# Patient Record
Sex: Female | Born: 1935
Health system: Southern US, Community
[De-identification: ages and names within clinical notes are randomized; demographics above are authoritative.]

## PROBLEM LIST (undated history)

## (undated) DIAGNOSIS — F32A Depression, unspecified: Secondary | ICD-10-CM

## (undated) DIAGNOSIS — F419 Anxiety disorder, unspecified: Secondary | ICD-10-CM

## (undated) DIAGNOSIS — M810 Age-related osteoporosis without current pathological fracture: Secondary | ICD-10-CM

## (undated) DIAGNOSIS — D72819 Decreased white blood cell count, unspecified: Secondary | ICD-10-CM

## (undated) DIAGNOSIS — E039 Hypothyroidism, unspecified: Secondary | ICD-10-CM

## (undated) DIAGNOSIS — M47812 Spondylosis without myelopathy or radiculopathy, cervical region: Secondary | ICD-10-CM

## (undated) DIAGNOSIS — F329 Major depressive disorder, single episode, unspecified: Secondary | ICD-10-CM

## (undated) DIAGNOSIS — M1990 Unspecified osteoarthritis, unspecified site: Secondary | ICD-10-CM

## (undated) DIAGNOSIS — I10 Essential (primary) hypertension: Secondary | ICD-10-CM

## (undated) HISTORY — PX: JOINT REPLACEMENT: SHX530

## (undated) HISTORY — DX: Decreased white blood cell count, unspecified: D72.819

## (undated) HISTORY — DX: Spondylosis without myelopathy or radiculopathy, cervical region: M47.812

## (undated) HISTORY — PX: FRACTURE SURGERY: SHX138

## (undated) HISTORY — DX: Age-related osteoporosis without current pathological fracture: M81.0

---

## 1997-12-27 ENCOUNTER — Other Ambulatory Visit: Admission: RE | Admit: 1997-12-27 | Discharge: 1997-12-27 | Payer: Self-pay | Admitting: *Deleted

## 1999-07-31 ENCOUNTER — Other Ambulatory Visit: Admission: RE | Admit: 1999-07-31 | Discharge: 1999-07-31 | Payer: Self-pay | Admitting: *Deleted

## 2000-11-25 ENCOUNTER — Encounter: Payer: Self-pay | Admitting: Orthopaedic Surgery

## 2000-11-25 ENCOUNTER — Ambulatory Visit (HOSPITAL_COMMUNITY): Admission: RE | Admit: 2000-11-25 | Discharge: 2000-11-25 | Payer: Self-pay | Admitting: Orthopaedic Surgery

## 2000-12-09 ENCOUNTER — Ambulatory Visit (HOSPITAL_COMMUNITY): Admission: RE | Admit: 2000-12-09 | Discharge: 2000-12-09 | Payer: Self-pay | Admitting: Orthopaedic Surgery

## 2000-12-16 ENCOUNTER — Ambulatory Visit (HOSPITAL_COMMUNITY): Admission: RE | Admit: 2000-12-16 | Discharge: 2000-12-16 | Payer: Self-pay | Admitting: Orthopaedic Surgery

## 2000-12-16 ENCOUNTER — Encounter: Payer: Self-pay | Admitting: Orthopaedic Surgery

## 2001-06-10 ENCOUNTER — Encounter: Payer: Self-pay | Admitting: Orthopaedic Surgery

## 2001-06-10 ENCOUNTER — Encounter: Admission: RE | Admit: 2001-06-10 | Discharge: 2001-06-10 | Payer: Self-pay | Admitting: Orthopaedic Surgery

## 2001-07-04 ENCOUNTER — Encounter: Admission: RE | Admit: 2001-07-04 | Discharge: 2001-07-04 | Payer: Self-pay | Admitting: Orthopaedic Surgery

## 2001-07-04 ENCOUNTER — Encounter: Payer: Self-pay | Admitting: Orthopaedic Surgery

## 2001-07-07 ENCOUNTER — Other Ambulatory Visit: Admission: RE | Admit: 2001-07-07 | Discharge: 2001-07-07 | Payer: Self-pay | Admitting: *Deleted

## 2002-11-03 ENCOUNTER — Other Ambulatory Visit: Admission: RE | Admit: 2002-11-03 | Discharge: 2002-11-03 | Payer: Self-pay | Admitting: *Deleted

## 2003-03-09 ENCOUNTER — Encounter (INDEPENDENT_AMBULATORY_CARE_PROVIDER_SITE_OTHER): Payer: Self-pay | Admitting: *Deleted

## 2003-03-09 ENCOUNTER — Ambulatory Visit (HOSPITAL_COMMUNITY): Admission: RE | Admit: 2003-03-09 | Discharge: 2003-03-09 | Payer: Self-pay | Admitting: Gastroenterology

## 2003-04-06 ENCOUNTER — Ambulatory Visit (HOSPITAL_COMMUNITY): Admission: RE | Admit: 2003-04-06 | Discharge: 2003-04-06 | Payer: Self-pay | Admitting: Neurosurgery

## 2003-10-18 ENCOUNTER — Ambulatory Visit (HOSPITAL_COMMUNITY): Admission: RE | Admit: 2003-10-18 | Discharge: 2003-10-18 | Payer: Self-pay | Admitting: Neurosurgery

## 2004-11-03 ENCOUNTER — Other Ambulatory Visit: Admission: RE | Admit: 2004-11-03 | Discharge: 2004-11-03 | Payer: Self-pay | Admitting: *Deleted

## 2006-12-29 ENCOUNTER — Other Ambulatory Visit: Admission: RE | Admit: 2006-12-29 | Discharge: 2006-12-29 | Payer: Self-pay | Admitting: *Deleted

## 2008-12-17 ENCOUNTER — Encounter (INDEPENDENT_AMBULATORY_CARE_PROVIDER_SITE_OTHER): Payer: Self-pay | Admitting: *Deleted

## 2008-12-17 ENCOUNTER — Ambulatory Visit (HOSPITAL_COMMUNITY): Admission: RE | Admit: 2008-12-17 | Discharge: 2008-12-17 | Payer: Self-pay | Admitting: *Deleted

## 2010-02-02 HISTORY — PX: HIP FRACTURE SURGERY: SHX118

## 2010-02-19 ENCOUNTER — Encounter: Admission: RE | Admit: 2010-02-19 | Discharge: 2010-02-19 | Payer: Self-pay | Admitting: Family Medicine

## 2010-02-24 ENCOUNTER — Inpatient Hospital Stay (HOSPITAL_COMMUNITY): Admission: EM | Admit: 2010-02-24 | Discharge: 2010-03-03 | Payer: Self-pay | Admitting: Emergency Medicine

## 2010-10-05 HISTORY — PX: EYE SURGERY: SHX253

## 2010-10-05 HISTORY — PX: APPENDECTOMY: SHX54

## 2010-11-20 ENCOUNTER — Other Ambulatory Visit: Payer: Self-pay | Admitting: General Surgery

## 2010-11-20 ENCOUNTER — Emergency Department (HOSPITAL_COMMUNITY): Payer: Medicare Other

## 2010-11-20 ENCOUNTER — Inpatient Hospital Stay (HOSPITAL_COMMUNITY)
Admission: EM | Admit: 2010-11-20 | Discharge: 2010-11-25 | DRG: 339 | Disposition: A | Discharge status: Home / Self Care | Payer: Medicare Other | Attending: Surgery | Admitting: Surgery

## 2010-11-20 DIAGNOSIS — K929 Disease of digestive system, unspecified: Secondary | ICD-10-CM | POA: Diagnosis not present

## 2010-11-20 DIAGNOSIS — K35209 Acute appendicitis with generalized peritonitis, without abscess, unspecified as to perforation: Principal | ICD-10-CM | POA: Diagnosis present

## 2010-11-20 DIAGNOSIS — F1011 Alcohol abuse, in remission: Secondary | ICD-10-CM | POA: Diagnosis present

## 2010-11-20 DIAGNOSIS — I10 Essential (primary) hypertension: Secondary | ICD-10-CM | POA: Diagnosis present

## 2010-11-20 DIAGNOSIS — F329 Major depressive disorder, single episode, unspecified: Secondary | ICD-10-CM | POA: Diagnosis present

## 2010-11-20 DIAGNOSIS — K560 Paralytic ileus: Secondary | ICD-10-CM | POA: Diagnosis not present

## 2010-11-20 DIAGNOSIS — E039 Hypothyroidism, unspecified: Secondary | ICD-10-CM | POA: Diagnosis present

## 2010-11-20 DIAGNOSIS — F3289 Other specified depressive episodes: Secondary | ICD-10-CM | POA: Diagnosis present

## 2010-11-20 DIAGNOSIS — Y836 Removal of other organ (partial) (total) as the cause of abnormal reaction of the patient, or of later complication, without mention of misadventure at the time of the procedure: Secondary | ICD-10-CM | POA: Diagnosis not present

## 2010-11-20 DIAGNOSIS — K352 Acute appendicitis with generalized peritonitis, without abscess: Principal | ICD-10-CM | POA: Diagnosis present

## 2010-11-20 LAB — URINALYSIS, ROUTINE W REFLEX MICROSCOPIC
Hgb urine dipstick: NEGATIVE
Leukocytes, UA: NEGATIVE
Nitrite: NEGATIVE
Protein, ur: 30 mg/dL — AB

## 2010-11-20 LAB — CBC
HCT: 40.6 % (ref 36.0–46.0)
MCV: 89 fL (ref 78.0–100.0)
RBC: 4.56 MIL/uL (ref 3.87–5.11)

## 2010-11-20 LAB — URINE MICROSCOPIC-ADD ON

## 2010-11-20 LAB — POCT I-STAT, CHEM 8
BUN: 16 mg/dL (ref 6–23)
Chloride: 104 mEq/L (ref 96–112)
Creatinine, Ser: 0.8 mg/dL (ref 0.4–1.2)
Glucose, Bld: 142 mg/dL — ABNORMAL HIGH (ref 70–99)
Potassium: 3.6 mEq/L (ref 3.5–5.1)
Sodium: 135 mEq/L (ref 135–145)

## 2010-11-20 LAB — ETHANOL: Alcohol, Ethyl (B): <5 mg/dL (ref 0–10)

## 2010-11-20 LAB — DIFFERENTIAL
Basophils Absolute: 0 10*3/uL (ref 0.0–0.1)
Basophils Relative: 0 % (ref 0–1)
Eosinophils Absolute: 0 10*3/uL (ref 0.0–0.7)
Lymphocytes Relative: 10 % — ABNORMAL LOW (ref 12–46)
Lymphs Abs: 1 10*3/uL (ref 0.7–4.0)
Monocytes Absolute: 0.8 10*3/uL (ref 0.1–1.0)
Monocytes Relative: 8 % (ref 3–12)
Neutrophils Relative %: 83 % — ABNORMAL HIGH (ref 43–77)

## 2010-11-20 LAB — LACTIC ACID, PLASMA: Lactic Acid, Venous: 1.9 mmol/L (ref 0.5–2.2)

## 2010-11-21 LAB — CBC
Hemoglobin: 11.2 g/dL — ABNORMAL LOW (ref 12.0–15.0)
MCH: 29.8 pg (ref 26.0–34.0)
RBC: 3.76 MIL/uL — ABNORMAL LOW (ref 3.87–5.11)
WBC: 3.5 10*3/uL — ABNORMAL LOW (ref 4.0–10.5)

## 2010-11-21 LAB — BASIC METABOLIC PANEL
BUN: 11 mg/dL (ref 6–23)
CO2: 25 mEq/L (ref 19–32)
Calcium: 8.7 mg/dL (ref 8.4–10.5)
Creatinine, Ser: 0.77 mg/dL (ref 0.4–1.2)
GFR calc Af Amer: >60 mL/min (ref >60)
Glucose, Bld: 136 mg/dL — ABNORMAL HIGH (ref 70–99)
Potassium: 4.2 mEq/L (ref 3.5–5.1)

## 2010-11-24 NOTE — Op Note (Signed)
NAMEDELANO, SCARDINO NO.:  [id number]  MEDICAL RECORD NO.:  [id number]           PATIENT TYPE:  E  LOCATION:  WLED                         FACILITY:  [facility]  PHYSICIAN:  Mary Sella. Andrey Campanile, MD     DATE OF BIRTH:  1933-12-22  DATE OF PROCEDURE:  11/20/2010 DATE OF DISCHARGE:                              OPERATIVE REPORT   PREOPERATIVE DIAGNOSIS:  Acute appendicitis.  POSTOPERATIVE DIAGNOSIS:  Perforated appendicitis.  PROCEDURE:  Laparoscopic appendectomy.  SURGEON:  Mary Sella. Andrey Campanile, MD  ASSISTANT SURGEON:  None.  ANESTHESIA:  General plus 24 cc of 0.25% Marcaine with epinephrine.  SPECIMEN:  Appendix.  ESTIMATED BLOOD LOSS:  Minimal.  FINDINGS:  The patient had a perforated appendix at the midportion of the appendix.  There was feculent material in the right pelvis.  The appendiceal base appeared inflamed, however, it was viable and therefore, I stapled across the base of the appendix incorporating a portion of the cecum.  I irrigated her abdomen with 5 L of saline.  INDICATIONS FOR PROCEDURE:  The patient is a 77 year old female who started feeling ill day before yesterday.  She started with periumbilical pain that migrated to her right lower quadrant.  Her pain worsened to where she could not tolerate it anymore, she came to the emergency room.  Workup and the CT scan were performed which demonstrated acute appendicitis.  We discussed the risks and benefits of the surgery including bleeding, infection, injury to surrounding structures, need to convert to an open procedure, DVT occurrence, hernia formation, wound infection, abscess formation, staple line complications such as bleeding or leak.  I also talked about the possibility of a prolonged ileus as a result of the intra-abdominal infection.  She elected to proceed to surgery.  DESCRIPTION OF PROCEDURE:  The patient was brought to the operating room, placed supine on the operating room table.   General endotracheal anesthesia was established.  Sequential compression devices were placed. A Foley catheter was placed under sterile condition.  A surgical time- out was performed.  Her abdomen was prepped and draped in the usual standard surgical fashion.  She received 1 g of Invanz in the emergency room.  After abdomen had been prepped and draped in the usual standard surgical fashion, I infiltrated local at the base of her umbilicus. Next, a 1-inch infraumbilical incision was made with a #11 blade.  The fascia was grasped and lifted anteriorly with Kochers.  The fascia was incised with a #11 blade, and the abdominal cavity was entered.  A purse- string suture consisting of 0 Vicryl on a UR-6 needle was placed around the fascial edges and a Hasson trocar was placed under direct visualization of the abdominal cavity.  Pneumoperitoneum was smoothly established up to a patient pressure of 15 mmHg.  The laparoscope was advanced, and the abdominal cavity was surveilled.  There was purulent fluid in the right pericolic gutter. It appeared that the appendix was perforated.  I then placed her in Trendelenburg position and rotated her to the left.  Two 5-mm trocars were then placed, one in the suprapubic region and one in the left  lower quadrant, all under direct visualization after local had been infiltrated.  I then gently reflected her small bowel up out of the pelvis to expose the terminal ileum and the cecum.  Her distal small bowel was a little bit dilated and appeared a little bit hyperemic.  As I exposed the right pericolic gutter, I visualized the tip of the appendix taking up toward the anterior abdominal wall.  I visualized what appeared to be a hole in the midbody of the appendix.  When I grasped the tip of the appendix, there was spontaneous drainage of what appeared to be feculent material out through this perforation in the midbody.  The terminal ileum was reflected up toward  the cecum was again confirmed and the appendix was again grasped and lifted up toward the anterior abdominal wall.  I took down the mesoappendix in a serial fashion using the Harmonic scalpel. There was some bleeding from the mesoappendix stump.  I used the Harmonic and fired it again to obtain hemostasis.  The appendiceal base was well exposed.  The base appeared viable.  I then obtained an articulating linear cutter/stapler was 45-mm white load.  I placed it across the base of the appendix incorporating a small portion of the cecum.  I then fired across it, thus freeing the appendix.  An Endobag was advanced through the Hasson trocar and the appendix was placed into the specimen bag and removed from the abdominal cavity. Pneumoperitoneum was re-established.  I then inspected the staple line, it appeared hemostatic.  I re-inspected the mesoappendix and it appeared hemostatic.  I then began irrigating the right lower quadrant and the right pericolic gutter with approximately 5 L of saline.  I evacuated as much of the irrigation fluid as I could.  I ran the small bowel for about a foot and a half.  There did not appear to be any enterotomies. I then inspected the right colon, it appeared normal, although slightly hyperemic.  I re-inspected the staple line and the mesoappendix, again both appeared to be hemostatic.  She was then rotated back into the level position and taken out of Trendelenburg.  I removed the Hasson trocar and tied down the previously placed purse-string suture.  I ended up placing another single interrupted 0 Vicryl stitch to completely close the fascial defect.  There was nothing within our fascial closure. There was no air leak at the umbilicus.  I placed additional local in the preperitoneal space under direct visualization of the umbilicus. The remaining trocars were removed and pneumoperitoneum was released. The skin incisions were irrigated.  The skin incisions were  closed with a 4-0 Monocryl in subcuticular fashion followed by the application of benzoin, Steri-Strips, and sterile bandages.  All needle, instrument, and sponge counts were correct x2.  There were no immediate complications.  The patient tolerated the procedure well.  She was extubated and taken to the recovery room in stable condition.     Mary Sella. Andrey Campanile, MD     EMW/MEDQ  D:  11/20/2010  T:  11/21/2010  Job:  [id number]  Electronically Signed by Gaynelle Adu M.D. on 11/24/2010 08:43:33 AM

## 2010-12-08 ENCOUNTER — Other Ambulatory Visit: Payer: Self-pay | Admitting: General Surgery

## 2010-12-08 DIAGNOSIS — R109 Unspecified abdominal pain: Secondary | ICD-10-CM

## 2010-12-09 ENCOUNTER — Ambulatory Visit
Admission: RE | Admit: 2010-12-09 | Discharge: 2010-12-09 | Disposition: A | Discharge status: Home / Self Care | Payer: Medicare Other | Source: Ambulatory Visit | Attending: General Surgery | Admitting: General Surgery

## 2010-12-09 DIAGNOSIS — R109 Unspecified abdominal pain: Secondary | ICD-10-CM

## 2010-12-09 MED ORDER — IOHEXOL 300 MG/ML  SOLN
100.0000 mL | Freq: Once | INTRAMUSCULAR | Status: AC | PRN
  Administered 2010-12-09: 100 mL via INTRAVENOUS

## 2010-12-11 NOTE — Discharge Summary (Signed)
  NAMETRU, LEOPARD NO.:  [id number]  MEDICAL RECORD NO.:  [id number]           PATIENT TYPE:  I  LOCATION:  1527                         FACILITY:  [facility]  PHYSICIAN:  Almond Lint, MD       DATE OF BIRTH:  1934-07-31  DATE OF ADMISSION:  11/20/2010 DATE OF DISCHARGE:  11/25/2010                              DISCHARGE SUMMARY   DISCHARGING PHYSICIAN:  Almond Lint, MD  CONSULTANTS:  None.  PROCEDURES:  Laparoscopic appendectomy by Dr. Andrey Campanile on November 20, 2010.  REASON FOR ADMISSION:  Tiffany Fox is a 77 year old female who began feeling ill 2 days prior to admission.  Her pain started periumbilically and migrated to the right lower quadrant.  Once her pain worsened to the point she could not tolerate it at home, she presented to the ER where she had a CT scan which revealed acute appendicitis.  We were asked to evaluate the patient.  Please see admitting history and physical for further details.ADMITTING DIAGNOSIS:  Acute appendicitis.  HOSPITAL COURSE:  At the time the patient was admitted, she was taken to the operating room where she was found to have a perforated appendicitis.  Her appendix was still able to be removed laparoscopically.  The patient tolerated the procedure well. Postoperatively, the patient developed a postoperative ileus.  She was continued on her IV Invanz during this time.  The patient finally began to resolve her ileus on postoperative day #3 at which point she was given clear liquids.  Over the next several days, her diet was advanced as tolerated.  By postoperative day #5, the patient was tolerating a regular diet and having bowel movements.  Her abdomen was soft and minimally tender with active bowel sounds.  All incisions were clean, dry, and intact.  DISCHARGE DIAGNOSIS:  Perforated appendix, status post laparoscopic appendectomy.  DISCHARGE MEDICATIONS:  Please see MAR.  DISCHARGE INSTRUCTIONS:  The patient may  increase her activity slowly and walk up steps.  She may shower.  She is not to do any heavy lifting for the next 2 weeks over 15 pounds.  She is not to drive for the next 3- 4 days up to 1 week.  She is to resume a low-sodium, heart-healthy diet. She is to follow up with Dr. Gaynelle Adu in our office in 10-14 days and she is to call for that appointment.     Tiffany Cape, PA   ______________________________ Almond Lint, MD    KEO/MEDQ  D:  11/25/2010  T:  11/25/2010  Job:  [id number]  cc:   Mary Sella. Andrey Campanile, MD [address] [place] [address]  Electronically Signed by Barnetta Chapel PA on 11/28/2010 07:13:05 AM Electronically Signed by Almond Lint MD on 12/10/2010 12:24:19 PM

## 2010-12-22 LAB — COMPREHENSIVE METABOLIC PANEL
AST: 52 U/L — ABNORMAL HIGH (ref 0–37)
Albumin: 2.7 g/dL — ABNORMAL LOW (ref 3.5–5.2)
Alkaline Phosphatase: 46 U/L (ref 39–117)
BUN: 10 mg/dL (ref 6–23)
BUN: 2 mg/dL — ABNORMAL LOW (ref 6–23)
CO2: 22 mEq/L (ref 19–32)
Calcium: 9 mg/dL (ref 8.4–10.5)
Chloride: 101 mEq/L (ref 96–112)
Creatinine, Ser: 0.65 mg/dL (ref 0.4–1.2)
GFR calc Af Amer: >60 mL/min (ref >60)
Glucose, Bld: 100 mg/dL — ABNORMAL HIGH (ref 70–99)
Potassium: 4.2 mEq/L (ref 3.5–5.1)
Sodium: 136 mEq/L (ref 135–145)
Total Protein: 5.2 g/dL — ABNORMAL LOW (ref 6.0–8.3)

## 2010-12-22 LAB — DIFFERENTIAL
Eosinophils Relative: 0 % (ref 0–5)
Lymphocytes Relative: 10 % — ABNORMAL LOW (ref 12–46)
Monocytes Absolute: 0.3 10*3/uL (ref 0.1–1.0)
Monocytes Relative: 5 % (ref 3–12)
Neutrophils Relative %: 85 % — ABNORMAL HIGH (ref 43–77)

## 2010-12-22 LAB — URINE CULTURE: Culture: NO GROWTH

## 2010-12-22 LAB — BASIC METABOLIC PANEL
BUN: 5 mg/dL — ABNORMAL LOW (ref 6–23)
CO2: 31 mEq/L (ref 19–32)
Chloride: 104 mEq/L (ref 96–112)
Chloride: 99 mEq/L (ref 96–112)
Creatinine, Ser: 0.67 mg/dL (ref 0.4–1.2)
GFR calc Af Amer: >60 mL/min (ref >60)
Potassium: 4.2 mEq/L (ref 3.5–5.1)
Sodium: 134 mEq/L — ABNORMAL LOW (ref 135–145)

## 2010-12-22 LAB — APTT: aPTT: 25 seconds (ref 24–37)

## 2010-12-22 LAB — CBC
HCT: 25.9 % — ABNORMAL LOW (ref 36.0–46.0)
HCT: 25.9 % — ABNORMAL LOW (ref 36.0–46.0)
HCT: 29.2 % — ABNORMAL LOW (ref 36.0–46.0)
HCT: 38.2 % (ref 36.0–46.0)
Hemoglobin: 8.9 g/dL — ABNORMAL LOW (ref 12.0–15.0)
MCHC: 33.3 g/dL (ref 30.0–36.0)
MCHC: 34 g/dL (ref 30.0–36.0)
MCV: 97.2 fL (ref 78.0–100.0)
MCV: 98.6 fL (ref 78.0–100.0)
MCV: 98.8 fL (ref 78.0–100.0)
Platelets: 118 10*3/uL — ABNORMAL LOW (ref 150–400)
Platelets: 161 10*3/uL (ref 150–400)
RBC: 2.63 MIL/uL — ABNORMAL LOW (ref 3.87–5.11)
RBC: 2.67 MIL/uL — ABNORMAL LOW (ref 3.87–5.11)
RBC: 3.87 MIL/uL (ref 3.87–5.11)
RDW: 13.2 % (ref 11.5–15.5)
WBC: 3.5 10*3/uL — ABNORMAL LOW (ref 4.0–10.5)

## 2010-12-22 LAB — URINALYSIS, ROUTINE W REFLEX MICROSCOPIC
Glucose, UA: NEGATIVE mg/dL
Nitrite: NEGATIVE
Specific Gravity, Urine: 1.007 (ref 1.005–1.030)
Urobilinogen, UA: 0.2 mg/dL (ref 0.0–1.0)
pH: 6.5 (ref 5.0–8.0)

## 2010-12-22 LAB — TYPE AND SCREEN
ABO/RH(D): A POS
Antibody Screen: NEGATIVE

## 2010-12-22 LAB — ABO/RH: ABO/RH(D): A POS

## 2010-12-22 LAB — TSH: TSH: 1.637 u[IU]/mL (ref 0.350–4.500)

## 2011-01-15 LAB — DIFFERENTIAL
Basophils Absolute: 0 10*3/uL (ref 0.0–0.1)
Basophils Relative: 1 % (ref 0–1)
Eosinophils Absolute: 0.1 10*3/uL (ref 0.0–0.7)
Monocytes Relative: 10 % (ref 3–12)
Neutro Abs: 1.9 10*3/uL (ref 1.7–7.7)
Neutrophils Relative %: 51 % (ref 43–77)

## 2011-01-15 LAB — BASIC METABOLIC PANEL
BUN: 9 mg/dL (ref 6–23)
CO2: 27 mEq/L (ref 19–32)
Calcium: 9.2 mg/dL (ref 8.4–10.5)
Creatinine, Ser: 0.94 mg/dL (ref 0.4–1.2)
GFR calc Af Amer: >60 mL/min (ref >60)

## 2011-01-15 LAB — CBC
MCHC: 34.8 g/dL (ref 30.0–36.0)
Platelets: 201 10*3/uL (ref 150–400)
RBC: 3.81 MIL/uL — ABNORMAL LOW (ref 3.87–5.11)

## 2011-02-17 NOTE — Op Note (Signed)
NAMEBRENTNEY, GOLDBACH NO.:  [id number]   MEDICAL RECORD NO.:  [id number]          PATIENT TYPE:  AMB   LOCATION:  DAY                          FACILITY[facility]  [facility]   PHYSICIAN:  Alfonse Ras, MD   DATE OF BIRTH:  08-08-1934   DATE OF PROCEDURE:  DATE OF DISCHARGE:                               OPERATIVE REPORT   PREOPERATIVE DIAGNOSIS:  Grade III internal hemorrhoids with prolapse.   POSTOPERATIVE DIAGNOSIS:  Grade III internal hemorrhoids with prolapse.   PROCEDURE:  PPH rectopexy.   SURGEON:  Alfonse Ras, MD   ANESTHESIA:  General.   DESCRIPTION:  After extensive informed consent was granted with the  patient both in the office and in the preop holding, she was taken to  the operating room and underwent general anesthesia using endotracheal  tube.  She was placed in a prone jack-knife position.  A perianal prep  and rectal prep were undertaken using Betadine solution.  Vagina was  also prepped.  Using 0.25% Marcaine with epinephrine and 1 mL Wydase,  the right anterior and right posterior lateral hemorrhoidal bundles were  injected.  The patient did have significant amount of prolapse.  Additional 20 mL of 0.25 Marcaine was then injected into the internal  and external sphincter muscles.  A 2-0 Prolene suture was then placed  approximately 5 cm proximal to the dentate line circumferentially.  This  was done in the mucosal and submucosal fashion.  Stapler was then  introduced and the pursestring was tied down around the anvil.  It was  closed and held in place for 45 seconds.  The vagina was checked.  There  was no evidence of septal involvement.  The stapler was then fired.  I  had a good rim of tissue.  No evidence of muscularis or muscle tissue at  all on the doughnut.  It was intact.  Staple line was then inspected.  There was no bleeding.  Gelfoam packing was placed.  Sterile dressing  was applied.  The patient tolerated the procedure well and  went to PACU  in good condition.      Alfonse Ras, MD  Electronically Signed     KRE/MEDQ  D:  12/17/2008  T:  12/17/2008  Job:  [id number]

## 2011-02-20 NOTE — Op Note (Signed)
Tiffany Fox, BRYANT NO.:  [id number]   MEDICAL RECORD NO.:  [id number]                   PATIENT TYPE:  AMB   LOCATION:  ENDO                                 FACILITY:  [facility]   PHYSICIAN:  Anselmo Rod, M.D.               DATE OF BIRTH:  15-Mar-1934   DATE OF PROCEDURE:  03/09/2003  DATE OF DISCHARGE:  03/09/2003                                 OPERATIVE REPORT   PROCEDURE PERFORMED:  Colonoscopy with snare polypectomy x2 and core  biopsies x2.   ENDOSCOPIST:  Anselmo Rod, M.D.   INSTRUMENT USED:  Olympus videocolonoscope.   INDICATION FOR THE PROCEDURE:  A 77 year old white female with a  questionable family history of colon cancer in her mother, and change in  bowel habits with occasional bright red blood p.r.n. undergoing screening  colonoscopy to rule out colonic polyps, masses, etc.   PREPROCEDURE PREPARATION:  Informed consent was procured from the patient.  The patient was fasted for eight hours prior to the procedure and prepped  with a bottle of magnesium citrate and a gallon of GoLYTELY the night prior  to the procedure.   PREPROCEDURE PHYSICAL:  VITAL SIGNS:  Stable.  NECK:  Supple.  CHEST:  Clear to auscultation.  S1 and S2 regular.  ABDOMEN:  Soft with normal bowel sounds.   DESCRIPTION OF THE PROCEDURE:  The patient was placed in the left lateral  decubitus position and sedated with 70 mg of Demerol and 8.5 mg of Versed  intravenously.  Once the patient was adequately sedated and maintained on  low-flow oxygen and continuous cardiac monitoring, the Olympus  videocolonoscope was advanced from the rectum to the cecum without  difficulty.  The patient had a fairly good prep.  Moderate size internal  hemorrhoids were seen on retroflexion.  A small external hemorrhoid was seen  on anal inspection.  A small sessile polyp was snared from 40 cm.  Another  polyp was snare from 20 cm.  A small sessile polyp was biopsied from 120  cm.  The patient tolerated the procedure well without complications.   IMPRESSION:  1. Three small sessile polyps snared as mentioned above.  2. Moderate size internal and small external hemorrhoid.  3. No large masses of polyps seen.  4. No evidence of diverticulosis.   RECOMMENDATIONS:  1. Await pathology results.  2. Avoid all nonsteroidals including aspirin for the next four weeks.  3.     Repeat CRC screening depending on pathology results.  4. Outpatient followup if the patient has any history of ongoing rectal     bleeding or lack of improvement in symptoms with fiber supplementation.  Anselmo Rod, M.D.    JNM/MEDQ  D:  03/09/2003  T:  03/11/2003  Job:  [id number]   cc:   Pershing Cox, M.D.  [address]  [address]  [place]  [place] [address]  Fax: [phone]

## 2011-09-01 ENCOUNTER — Encounter: Payer: Self-pay | Admitting: *Deleted

## 2011-09-01 ENCOUNTER — Emergency Department (HOSPITAL_COMMUNITY)
Admission: EM | Admit: 2011-09-01 | Discharge: 2011-09-01 | Disposition: A | Discharge status: Home / Self Care | Payer: Medicare Other | Attending: Emergency Medicine | Admitting: Emergency Medicine

## 2011-09-01 ENCOUNTER — Emergency Department (HOSPITAL_COMMUNITY): Payer: Medicare Other

## 2011-09-01 DIAGNOSIS — I10 Essential (primary) hypertension: Secondary | ICD-10-CM | POA: Insufficient documentation

## 2011-09-01 DIAGNOSIS — R42 Dizziness and giddiness: Secondary | ICD-10-CM | POA: Insufficient documentation

## 2011-09-01 DIAGNOSIS — R350 Frequency of micturition: Secondary | ICD-10-CM | POA: Insufficient documentation

## 2011-09-01 DIAGNOSIS — Z79899 Other long term (current) drug therapy: Secondary | ICD-10-CM | POA: Insufficient documentation

## 2011-09-01 DIAGNOSIS — I951 Orthostatic hypotension: Secondary | ICD-10-CM | POA: Insufficient documentation

## 2011-09-01 HISTORY — DX: Essential (primary) hypertension: I10

## 2011-09-01 LAB — URINALYSIS, ROUTINE W REFLEX MICROSCOPIC
Bilirubin Urine: NEGATIVE
Ketones, ur: NEGATIVE mg/dL
Nitrite: NEGATIVE
Urobilinogen, UA: 0.2 mg/dL (ref 0.0–1.0)

## 2011-09-01 LAB — CBC
Hemoglobin: 12 g/dL (ref 12.0–15.0)
MCH: 31 pg (ref 26.0–34.0)
Platelets: 197 10*3/uL (ref 150–400)
RBC: 3.87 MIL/uL (ref 3.87–5.11)
WBC: 3.5 10*3/uL — ABNORMAL LOW (ref 4.0–10.5)

## 2011-09-01 LAB — CARDIAC PANEL(CRET KIN+CKTOT+MB+TROPI)
CK, MB: 4.2 ng/mL — ABNORMAL HIGH (ref 0.3–4.0)
CK, MB: 3.8 ng/mL (ref 0.3–4.0)
Relative Index: 2.4 (ref 0.0–2.5)
Relative Index: 2.2 (ref 0.0–2.5)
Total CK: 177 U/L (ref 7–177)
Total CK: 169 U/L (ref 7–177)
Troponin I: <0.3 ng/mL (ref <0.30)
Troponin I: <0.3 ng/mL (ref <0.30)

## 2011-09-01 LAB — POCT I-STAT, CHEM 8
Creatinine, Ser: 0.9 mg/dL (ref 0.50–1.10)
HCT: 39 % (ref 36.0–46.0)
Hemoglobin: 13.3 g/dL (ref 12.0–15.0)
Potassium: 4.4 mEq/L (ref 3.5–5.1)
Sodium: 139 mEq/L (ref 135–145)
TCO2: 27 mmol/L (ref 0–100)

## 2011-09-01 LAB — DIFFERENTIAL
Lymphocytes Relative: 45 % (ref 12–46)
Lymphs Abs: 1.6 10*3/uL (ref 0.7–4.0)
Monocytes Relative: 10 % (ref 3–12)
Neutro Abs: 1.5 10*3/uL — ABNORMAL LOW (ref 1.7–7.7)
Neutrophils Relative %: 44 % (ref 43–77)

## 2011-09-01 NOTE — ED Provider Notes (Signed)
History     CSN: [id number] Arrival date & time: 09/01/2011  5:02 PM   First MD Initiated Contact with Patient 09/01/11 2054      Chief Complaint  Patient presents with  . Dizziness    HPI  History provided by the patient. Patient with history of hypertension presents today with complaints of episodes of lightheadedness and near syncope with standing earlier today. Patient states that between 9 and 10 AM she had several episodes of feeling very faint after trying to stand and walk. She was with a friend at that time who became concerned of her symptoms. Patient called her PCP and was encouraged to come to the emergency room based on her description of symptoms. Patient also had a friend who is an EMT come to her home and check her blood pressure. Patient states that her blood pressure was low while she was sitting and then it dropped 20 points when she stood up. Patient does report taking her blood pressure medication in the afternoon today. Since this morning she has not had any continued symptoms and feels well. She denies chest pain, shortness of breath, nausea, diaphoresis. Patient does report some history of increased urination over the past few days. She denies feeling dehydrated or having a dry mouth. Patient does not report any other significant past medical history   Past Medical History  Diagnosis Date  . Hypertension     History reviewed. No pertinent past surgical history.  History reviewed. No pertinent family history.  History  Substance Use Topics  . Smoking status: Never Smoker   . Smokeless tobacco: Not on file  . Alcohol Use: No    OB History    Grav Para Term Preterm Abortions TAB SAB Ect Mult Living                  Review of Systems  Constitutional: Negative for fever and chills.  Respiratory: Negative for cough and shortness of breath.   Cardiovascular: Negative for chest pain and palpitations.  Gastrointestinal: Negative for nausea, vomiting,  abdominal pain, diarrhea and constipation.  Genitourinary: Positive for frequency. Negative for dysuria and hematuria.  Neurological: Positive for light-headedness. Negative for syncope, weakness and headaches.  All other systems reviewed and are negative.    Allergies  Lisinopril  Home Medications   Current Outpatient Rx  Name Route Sig Dispense Refill  . VITAMIN D 1000 UNITS PO TABS Oral Take 2,000 Units by mouth daily. With K1     . CYCLOSPORINE 0.05 % OP EMUL Both Eyes Place 1 drop into both eyes daily.      . OMEGA-3-ACID ETHYL ESTERS 1 G PO CAPS Oral Take 1 g by mouth 2 (two) times daily.      Marland Kitchen SIMVASTATIN 20 MG PO TABS Oral Take 20 mg by mouth at bedtime.      . THYROID 60 MG PO TABS Oral Take 60 mg by mouth daily.      . TRAMADOL HCL 50 MG PO TABS Oral Take 50-100 mg by mouth every 6 (six) hours as needed. Maximum dose= 8 tablets per day For pain     . TRAZODONE HCL 100 MG PO TABS Oral Take 100 mg by mouth at bedtime. For sleep     . VALSARTAN 40 MG PO TABS Oral Take 40 mg by mouth daily.        BP 139/68  Pulse 68  Temp(Src) 98.1 F (36.7 C) (Oral)  Resp 17  SpO2 99%  Physical Exam  Nursing note and vitals reviewed. Constitutional: She is oriented to person, place, and time. She appears well-developed and well-nourished. No distress.  HENT:  Head: Normocephalic and atraumatic.  Cardiovascular: Normal rate, regular rhythm and normal heart sounds.   No murmur heard. Pulmonary/Chest: Effort normal and breath sounds normal. She has no wheezes. She has no rales.  Abdominal: Soft. She exhibits no distension. There is no tenderness. There is no rebound and no guarding.  Musculoskeletal: She exhibits no edema and no tenderness.  Neurological: She is alert and oriented to person, place, and time.  Skin: Skin is warm. No rash noted.  Psychiatric: She has a normal mood and affect. Her behavior is normal.    ED Course  Procedures (including critical care time)  Labs  Reviewed  CBC - Abnormal; Notable for the following:    WBC 3.5 (*)    HCT 35.7 (*)    All other components within normal limits  DIFFERENTIAL - Abnormal; Notable for the following:    Neutro Abs 1.5 (*)    All other components within normal limits  CARDIAC PANEL(CRET KIN+CKTOT+MB+TROPI) - Abnormal; Notable for the following:    CK, MB 4.2 (*)    All other components within normal limits  POCT I-STAT, CHEM 8 - Abnormal; Notable for the following:    BUN 26 (*)    All other components within normal limits  CARDIAC PANEL(CRET KIN+CKTOT+MB+TROPI)  URINALYSIS, ROUTINE W REFLEX MICROSCOPIC   Results for orders placed during the hospital encounter of 09/01/11  CBC      Component Value Range   WBC 3.5 (*) 4.0 - 10.5 (K/uL)   RBC 3.87  3.87 - 5.11 (MIL/uL)   Hemoglobin 12.0  12.0 - 15.0 (g/dL)   HCT 91.4 (*) 78.2 - 46.0 (%)   MCV 92.2  78.0 - 100.0 (fL)   MCH 31.0  26.0 - 34.0 (pg)   MCHC 33.6  30.0 - 36.0 (g/dL)   RDW 95.6  21.3 - 08.6 (%)   Platelets 197  150 - 400 (K/uL)  DIFFERENTIAL      Component Value Range   Neutrophils Relative 44  43 - 77 (%)   Neutro Abs 1.5 (*) 1.7 - 7.7 (K/uL)   Lymphocytes Relative 45  12 - 46 (%)   Lymphs Abs 1.6  0.7 - 4.0 (K/uL)   Monocytes Relative 10  3 - 12 (%)   Monocytes Absolute 0.3  0.1 - 1.0 (K/uL)   Eosinophils Relative 2  0 - 5 (%)   Eosinophils Absolute 0.1  0.0 - 0.7 (K/uL)   Basophils Relative 1  0 - 1 (%)   Basophils Absolute 0.0  0.0 - 0.1 (K/uL)  CARDIAC PANEL(CRET KIN+CKTOT+MB+TROPI)      Component Value Range   Total CK 177  7 - 177 (U/L)   CK, MB 4.2 (*) 0.3 - 4.0 (ng/mL)   Troponin I <0.30  <0.30 (ng/mL)   Relative Index 2.4  0.0 - 2.5   POCT I-STAT, CHEM 8      Component Value Range   Sodium 139  135 - 145 (mEq/L)   Potassium 4.4  3.5 - 5.1 (mEq/L)   Chloride 104  96 - 112 (mEq/L)   BUN 26 (*) 6 - 23 (mg/dL)   Creatinine, Ser 5.78  0.50 - 1.10 (mg/dL)   Glucose, Bld 93  70 - 99 (mg/dL)   Calcium, Ion 4.69  6.29 -  1.32 (mmol/L)   TCO2 27  0 -  100 (mmol/L)   Hemoglobin 13.3  12.0 - 15.0 (g/dL)   HCT 57.8  46.9 - 62.9 (%)  CARDIAC PANEL(CRET KIN+CKTOT+MB+TROPI)      Component Value Range   Total CK 169  7 - 177 (U/L)   CK, MB 3.8  0.3 - 4.0 (ng/mL)   Troponin I <0.30  <0.30 (ng/mL)   Relative Index 2.2  0.0 - 2.5   URINALYSIS, ROUTINE W REFLEX MICROSCOPIC      Component Value Range   Color, Urine YELLOW  YELLOW    Appearance CLEAR  CLEAR    Specific Gravity, Urine 1.013  1.005 - 1.030    pH 5.5  5.0 - 8.0    Glucose, UA NEGATIVE  NEGATIVE (mg/dL)   Hgb urine dipstick NEGATIVE  NEGATIVE    Bilirubin Urine NEGATIVE  NEGATIVE    Ketones, ur NEGATIVE  NEGATIVE (mg/dL)   Protein, ur NEGATIVE  NEGATIVE (mg/dL)   Urobilinogen, UA 0.2  0.0 - 1.0 (mg/dL)   Nitrite NEGATIVE  NEGATIVE    Leukocytes, UA NEGATIVE  NEGATIVE       Dg Chest 2 View  09/01/2011  *RADIOLOGY REPORT*  Clinical Data: Near syncope.  CHEST - 2 VIEW  Comparison: 02/24/2010  Findings: Stable appearance of COPD. No edema, infiltrate, nodule or pleural effusion identified.  The heart size and mediastinal contours within normal limits.  Bony thorax shows osteopenia and mild thoracic spondylosis.  IMPRESSION: Stable COPD.  No acute findings.  Original Report Authenticated By: Reola Calkins, M.D.     1. Orthostatic hypotension       MDM  9:30 PM patient seen and evaluated. Patient in no acute distress. Patient reports having an appointment with her PCP tomorrow   Patient continues to feel asymptomatic. She has slight signs of orthostatic vital signs but denies any symptoms during procedure. Patient is taking by mouth fluids. She does not appear severely dehydrated based on her labs. At this time we'll recommend patient skip her next dose of blood pressure medication and followup with her PCP tomorrow as planned.      Date: 09/01/2011 5:34 PM  Rate: 62  Rhythm: normal sinus rhythm  QRS Axis: normal  Intervals: normal   ST/T Wave abnormalities: normal  Conduction Disutrbances:none  Narrative Interpretation:   Old EKG Reviewed: unchanged from 11/20/2010    Date: 09/01/2011 9:21 PM  Rate: 80  Rhythm: normal sinus rhythm and premature atrial contractions (PAC)  QRS Axis: normal  Intervals: normal  ST/T Wave abnormalities: normal  Conduction Disutrbances:none  Narrative Interpretation:   Old EKG Reviewed: No significant changes         Angus Seller, [place] 09/01/11 2237

## 2011-09-01 NOTE — ED Provider Notes (Signed)
Medical screening examination/treatment/procedure(s) were performed by non-physician practitioner and as supervising physician I was immediately available for consultation/collaboration.   Eleanor Gatliff, MD 09/01/11 2307 

## 2011-09-01 NOTE — ED Notes (Signed)
The pt  Had an acute phase of nausea and dizziness this am and paramedics arrive and she had an orthostatic drop in bp.  She has had these symptoms in the past month with low bp when it has been checked.  No pain she feels very dry.  The dizziness has gone now.

## 2011-11-25 DIAGNOSIS — M25559 Pain in unspecified hip: Secondary | ICD-10-CM | POA: Diagnosis not present

## 2011-12-28 DIAGNOSIS — H04129 Dry eye syndrome of unspecified lacrimal gland: Secondary | ICD-10-CM | POA: Diagnosis not present

## 2012-02-01 DIAGNOSIS — D485 Neoplasm of uncertain behavior of skin: Secondary | ICD-10-CM | POA: Diagnosis not present

## 2012-02-01 DIAGNOSIS — L738 Other specified follicular disorders: Secondary | ICD-10-CM | POA: Diagnosis not present

## 2012-02-01 DIAGNOSIS — L570 Actinic keratosis: Secondary | ICD-10-CM | POA: Diagnosis not present

## 2012-02-15 DIAGNOSIS — L570 Actinic keratosis: Secondary | ICD-10-CM | POA: Diagnosis not present

## 2012-02-23 DIAGNOSIS — E559 Vitamin D deficiency, unspecified: Secondary | ICD-10-CM | POA: Diagnosis not present

## 2012-02-23 DIAGNOSIS — M810 Age-related osteoporosis without current pathological fracture: Secondary | ICD-10-CM | POA: Diagnosis not present

## 2012-02-23 DIAGNOSIS — E039 Hypothyroidism, unspecified: Secondary | ICD-10-CM | POA: Diagnosis not present

## 2012-02-23 DIAGNOSIS — E785 Hyperlipidemia, unspecified: Secondary | ICD-10-CM | POA: Diagnosis not present

## 2012-02-23 DIAGNOSIS — I10 Essential (primary) hypertension: Secondary | ICD-10-CM | POA: Diagnosis not present

## 2012-02-23 DIAGNOSIS — E538 Deficiency of other specified B group vitamins: Secondary | ICD-10-CM | POA: Diagnosis not present

## 2012-03-09 DIAGNOSIS — Z1231 Encounter for screening mammogram for malignant neoplasm of breast: Secondary | ICD-10-CM | POA: Diagnosis not present

## 2012-03-09 DIAGNOSIS — M949 Disorder of cartilage, unspecified: Secondary | ICD-10-CM | POA: Diagnosis not present

## 2012-03-09 DIAGNOSIS — M899 Disorder of bone, unspecified: Secondary | ICD-10-CM | POA: Diagnosis not present

## 2012-03-17 DIAGNOSIS — L570 Actinic keratosis: Secondary | ICD-10-CM | POA: Diagnosis not present

## 2012-04-20 DIAGNOSIS — H43399 Other vitreous opacities, unspecified eye: Secondary | ICD-10-CM | POA: Diagnosis not present

## 2012-04-20 DIAGNOSIS — H04129 Dry eye syndrome of unspecified lacrimal gland: Secondary | ICD-10-CM | POA: Diagnosis not present

## 2012-08-24 DIAGNOSIS — L570 Actinic keratosis: Secondary | ICD-10-CM | POA: Diagnosis not present

## 2012-08-24 DIAGNOSIS — L738 Other specified follicular disorders: Secondary | ICD-10-CM | POA: Diagnosis not present

## 2012-08-25 DIAGNOSIS — Z0000 Encounter for general adult medical examination without abnormal findings: Secondary | ICD-10-CM | POA: Diagnosis not present

## 2012-08-25 DIAGNOSIS — M810 Age-related osteoporosis without current pathological fracture: Secondary | ICD-10-CM | POA: Diagnosis not present

## 2012-08-25 DIAGNOSIS — E785 Hyperlipidemia, unspecified: Secondary | ICD-10-CM | POA: Diagnosis not present

## 2012-08-25 DIAGNOSIS — E039 Hypothyroidism, unspecified: Secondary | ICD-10-CM | POA: Diagnosis not present

## 2012-10-28 DIAGNOSIS — L570 Actinic keratosis: Secondary | ICD-10-CM | POA: Diagnosis not present

## 2013-01-24 DIAGNOSIS — H40019 Open angle with borderline findings, low risk, unspecified eye: Secondary | ICD-10-CM | POA: Diagnosis not present

## 2013-01-24 DIAGNOSIS — H43399 Other vitreous opacities, unspecified eye: Secondary | ICD-10-CM | POA: Diagnosis not present

## 2013-01-24 DIAGNOSIS — H04129 Dry eye syndrome of unspecified lacrimal gland: Secondary | ICD-10-CM | POA: Diagnosis not present

## 2013-01-24 DIAGNOSIS — H01009 Unspecified blepharitis unspecified eye, unspecified eyelid: Secondary | ICD-10-CM | POA: Diagnosis not present

## 2013-02-28 DIAGNOSIS — N3941 Urge incontinence: Secondary | ICD-10-CM | POA: Diagnosis not present

## 2013-02-28 DIAGNOSIS — E039 Hypothyroidism, unspecified: Secondary | ICD-10-CM | POA: Diagnosis not present

## 2013-02-28 DIAGNOSIS — E785 Hyperlipidemia, unspecified: Secondary | ICD-10-CM | POA: Diagnosis not present

## 2013-02-28 DIAGNOSIS — M810 Age-related osteoporosis without current pathological fracture: Secondary | ICD-10-CM | POA: Diagnosis not present

## 2013-04-24 DIAGNOSIS — L259 Unspecified contact dermatitis, unspecified cause: Secondary | ICD-10-CM | POA: Diagnosis not present

## 2013-04-24 DIAGNOSIS — L570 Actinic keratosis: Secondary | ICD-10-CM | POA: Diagnosis not present

## 2013-04-24 DIAGNOSIS — L821 Other seborrheic keratosis: Secondary | ICD-10-CM | POA: Diagnosis not present

## 2013-08-15 DIAGNOSIS — H40019 Open angle with borderline findings, low risk, unspecified eye: Secondary | ICD-10-CM | POA: Diagnosis not present

## 2013-08-15 DIAGNOSIS — H26499 Other secondary cataract, unspecified eye: Secondary | ICD-10-CM | POA: Diagnosis not present

## 2013-08-15 DIAGNOSIS — H3530 Unspecified macular degeneration: Secondary | ICD-10-CM | POA: Diagnosis not present

## 2013-08-15 DIAGNOSIS — H04129 Dry eye syndrome of unspecified lacrimal gland: Secondary | ICD-10-CM | POA: Diagnosis not present

## 2013-09-02 DIAGNOSIS — R109 Unspecified abdominal pain: Secondary | ICD-10-CM | POA: Diagnosis not present

## 2013-09-07 DIAGNOSIS — R109 Unspecified abdominal pain: Secondary | ICD-10-CM | POA: Diagnosis not present

## 2013-11-01 DIAGNOSIS — L570 Actinic keratosis: Secondary | ICD-10-CM | POA: Diagnosis not present

## 2013-11-01 DIAGNOSIS — L821 Other seborrheic keratosis: Secondary | ICD-10-CM | POA: Diagnosis not present

## 2013-11-01 DIAGNOSIS — Z85828 Personal history of other malignant neoplasm of skin: Secondary | ICD-10-CM | POA: Diagnosis not present

## 2013-11-01 DIAGNOSIS — D485 Neoplasm of uncertain behavior of skin: Secondary | ICD-10-CM | POA: Diagnosis not present

## 2013-11-01 DIAGNOSIS — C44721 Squamous cell carcinoma of skin of unspecified lower limb, including hip: Secondary | ICD-10-CM | POA: Diagnosis not present

## 2013-12-06 DIAGNOSIS — M810 Age-related osteoporosis without current pathological fracture: Secondary | ICD-10-CM | POA: Diagnosis not present

## 2014-01-17 DIAGNOSIS — L570 Actinic keratosis: Secondary | ICD-10-CM | POA: Diagnosis not present

## 2014-01-17 DIAGNOSIS — L578 Other skin changes due to chronic exposure to nonionizing radiation: Secondary | ICD-10-CM | POA: Diagnosis not present

## 2014-01-17 DIAGNOSIS — Z85828 Personal history of other malignant neoplasm of skin: Secondary | ICD-10-CM | POA: Diagnosis not present

## 2014-01-17 DIAGNOSIS — C44721 Squamous cell carcinoma of skin of unspecified lower limb, including hip: Secondary | ICD-10-CM | POA: Diagnosis not present

## 2014-03-01 DIAGNOSIS — Z0000 Encounter for general adult medical examination without abnormal findings: Secondary | ICD-10-CM | POA: Diagnosis not present

## 2014-03-01 DIAGNOSIS — E039 Hypothyroidism, unspecified: Secondary | ICD-10-CM | POA: Diagnosis not present

## 2014-03-01 DIAGNOSIS — M810 Age-related osteoporosis without current pathological fracture: Secondary | ICD-10-CM | POA: Diagnosis not present

## 2014-03-01 DIAGNOSIS — Z8679 Personal history of other diseases of the circulatory system: Secondary | ICD-10-CM | POA: Diagnosis not present

## 2014-03-13 DIAGNOSIS — Z8262 Family history of osteoporosis: Secondary | ICD-10-CM | POA: Diagnosis not present

## 2014-03-13 DIAGNOSIS — Z1231 Encounter for screening mammogram for malignant neoplasm of breast: Secondary | ICD-10-CM | POA: Diagnosis not present

## 2014-03-13 DIAGNOSIS — M810 Age-related osteoporosis without current pathological fracture: Secondary | ICD-10-CM | POA: Diagnosis not present

## 2014-05-17 DIAGNOSIS — D485 Neoplasm of uncertain behavior of skin: Secondary | ICD-10-CM | POA: Diagnosis not present

## 2014-05-17 DIAGNOSIS — L259 Unspecified contact dermatitis, unspecified cause: Secondary | ICD-10-CM | POA: Diagnosis not present

## 2014-05-17 DIAGNOSIS — L139 Bullous disorder, unspecified: Secondary | ICD-10-CM | POA: Diagnosis not present

## 2014-05-17 DIAGNOSIS — L821 Other seborrheic keratosis: Secondary | ICD-10-CM | POA: Diagnosis not present

## 2014-05-17 DIAGNOSIS — L2089 Other atopic dermatitis: Secondary | ICD-10-CM | POA: Diagnosis not present

## 2014-05-17 DIAGNOSIS — Z85828 Personal history of other malignant neoplasm of skin: Secondary | ICD-10-CM | POA: Diagnosis not present

## 2014-05-17 DIAGNOSIS — L570 Actinic keratosis: Secondary | ICD-10-CM | POA: Diagnosis not present

## 2014-06-07 DIAGNOSIS — E039 Hypothyroidism, unspecified: Secondary | ICD-10-CM | POA: Diagnosis not present

## 2014-06-20 DIAGNOSIS — M76899 Other specified enthesopathies of unspecified lower limb, excluding foot: Secondary | ICD-10-CM | POA: Diagnosis not present

## 2014-07-18 DIAGNOSIS — Z9889 Other specified postprocedural states: Secondary | ICD-10-CM | POA: Diagnosis not present

## 2014-07-18 DIAGNOSIS — M25552 Pain in left hip: Secondary | ICD-10-CM | POA: Diagnosis not present

## 2014-08-24 DIAGNOSIS — L570 Actinic keratosis: Secondary | ICD-10-CM | POA: Diagnosis not present

## 2014-08-24 DIAGNOSIS — Z85828 Personal history of other malignant neoplasm of skin: Secondary | ICD-10-CM | POA: Diagnosis not present

## 2014-08-24 DIAGNOSIS — L821 Other seborrheic keratosis: Secondary | ICD-10-CM | POA: Diagnosis not present

## 2014-10-08 DIAGNOSIS — L570 Actinic keratosis: Secondary | ICD-10-CM | POA: Diagnosis not present

## 2014-10-24 DIAGNOSIS — R197 Diarrhea, unspecified: Secondary | ICD-10-CM | POA: Diagnosis not present

## 2014-10-24 DIAGNOSIS — E039 Hypothyroidism, unspecified: Secondary | ICD-10-CM | POA: Diagnosis not present

## 2014-10-25 ENCOUNTER — Other Ambulatory Visit: Payer: Self-pay | Admitting: Family Medicine

## 2014-10-25 DIAGNOSIS — R197 Diarrhea, unspecified: Secondary | ICD-10-CM

## 2014-10-29 DIAGNOSIS — D72819 Decreased white blood cell count, unspecified: Secondary | ICD-10-CM | POA: Diagnosis not present

## 2014-10-29 DIAGNOSIS — R718 Other abnormality of red blood cells: Secondary | ICD-10-CM | POA: Diagnosis not present

## 2014-11-01 ENCOUNTER — Other Ambulatory Visit: Payer: Medicare Other

## 2014-11-08 DIAGNOSIS — K625 Hemorrhage of anus and rectum: Secondary | ICD-10-CM | POA: Diagnosis not present

## 2014-11-08 DIAGNOSIS — R197 Diarrhea, unspecified: Secondary | ICD-10-CM | POA: Diagnosis not present

## 2014-11-13 ENCOUNTER — Telehealth: Payer: Self-pay | Admitting: Internal Medicine

## 2014-11-13 DIAGNOSIS — R197 Diarrhea, unspecified: Secondary | ICD-10-CM | POA: Diagnosis not present

## 2014-11-13 NOTE — Telephone Encounter (Signed)
S/W PT IN REF TO NP APPT ON 11/21/14 @1 :61 REFERRING DR NNODI DX-LOW WBC

## 2014-11-16 ENCOUNTER — Telehealth: Payer: Self-pay | Admitting: Internal Medicine

## 2014-11-16 NOTE — Telephone Encounter (Signed)
Chart delivered °

## 2014-11-19 ENCOUNTER — Telehealth: Payer: Self-pay | Admitting: Internal Medicine

## 2014-11-19 NOTE — Telephone Encounter (Signed)
Pt aware of appt. On 12/18/14@11 :00

## 2014-11-20 ENCOUNTER — Other Ambulatory Visit: Payer: Self-pay | Admitting: Medical Oncology

## 2014-11-21 ENCOUNTER — Other Ambulatory Visit: Payer: Medicare Other

## 2014-11-21 ENCOUNTER — Ambulatory Visit: Payer: Medicare Other | Admitting: Internal Medicine

## 2014-11-21 ENCOUNTER — Ambulatory Visit: Payer: Medicare Other

## 2014-12-11 ENCOUNTER — Other Ambulatory Visit: Payer: Self-pay | Admitting: Gastroenterology

## 2014-12-11 DIAGNOSIS — Q438 Other specified congenital malformations of intestine: Secondary | ICD-10-CM | POA: Diagnosis not present

## 2014-12-11 DIAGNOSIS — K5730 Diverticulosis of large intestine without perforation or abscess without bleeding: Secondary | ICD-10-CM | POA: Diagnosis not present

## 2014-12-11 DIAGNOSIS — K648 Other hemorrhoids: Secondary | ICD-10-CM | POA: Diagnosis not present

## 2014-12-11 DIAGNOSIS — K625 Hemorrhage of anus and rectum: Secondary | ICD-10-CM | POA: Diagnosis not present

## 2014-12-11 DIAGNOSIS — R197 Diarrhea, unspecified: Secondary | ICD-10-CM | POA: Diagnosis not present

## 2014-12-17 ENCOUNTER — Other Ambulatory Visit: Payer: Self-pay | Admitting: Medical Oncology

## 2014-12-17 DIAGNOSIS — D72819 Decreased white blood cell count, unspecified: Secondary | ICD-10-CM

## 2014-12-18 ENCOUNTER — Telehealth: Payer: Self-pay | Admitting: Internal Medicine

## 2014-12-18 ENCOUNTER — Other Ambulatory Visit: Payer: Self-pay | Admitting: Internal Medicine

## 2014-12-18 ENCOUNTER — Other Ambulatory Visit: Payer: Self-pay | Admitting: Medical Oncology

## 2014-12-18 ENCOUNTER — Encounter: Payer: Self-pay | Admitting: Internal Medicine

## 2014-12-18 ENCOUNTER — Ambulatory Visit (HOSPITAL_BASED_OUTPATIENT_CLINIC_OR_DEPARTMENT_OTHER): Payer: Medicare Other | Admitting: Internal Medicine

## 2014-12-18 ENCOUNTER — Ambulatory Visit (HOSPITAL_BASED_OUTPATIENT_CLINIC_OR_DEPARTMENT_OTHER): Payer: Medicare Other

## 2014-12-18 ENCOUNTER — Ambulatory Visit: Payer: Medicare Other

## 2014-12-18 VITALS — BP 159/65 | HR 73 | Temp 98.0°F | Resp 18 | Ht 64.0 in | Wt 129.6 lb

## 2014-12-18 DIAGNOSIS — D61818 Other pancytopenia: Secondary | ICD-10-CM | POA: Insufficient documentation

## 2014-12-18 DIAGNOSIS — D72819 Decreased white blood cell count, unspecified: Secondary | ICD-10-CM

## 2014-12-18 DIAGNOSIS — D649 Anemia, unspecified: Secondary | ICD-10-CM

## 2014-12-18 LAB — CBC WITH DIFFERENTIAL/PLATELET
BASO%: 0.8 % (ref 0.0–2.0)
BASOS ABS: 0 10*3/uL (ref 0.0–0.1)
EOS%: 3.8 % (ref 0.0–7.0)
Eosinophils Absolute: 0.1 10*3/uL (ref 0.0–0.5)
HEMATOCRIT: 38.7 % (ref 34.8–46.6)
HEMOGLOBIN: 13.1 g/dL (ref 11.6–15.9)
LYMPH#: 1.2 10*3/uL (ref 0.9–3.3)
LYMPH%: 44.6 % (ref 14.0–49.7)
MCH: 32.6 pg (ref 25.1–34.0)
MCHC: 33.9 g/dL (ref 31.5–36.0)
MCV: 96.3 fL (ref 79.5–101.0)
MONO#: 0.4 10*3/uL (ref 0.1–0.9)
MONO%: 13.8 % (ref 0.0–14.0)
NEUT#: 1 10*3/uL — ABNORMAL LOW (ref 1.5–6.5)
NEUT%: 37 % — AB (ref 38.4–76.8)
Platelets: 185 10*3/uL (ref 145–400)
RBC: 4.02 10*6/uL (ref 3.70–5.45)
RDW: 14 % (ref 11.2–14.5)
WBC: 2.6 10*3/uL — ABNORMAL LOW (ref 3.9–10.3)

## 2014-12-18 LAB — COMPREHENSIVE METABOLIC PANEL (CC13)
ALK PHOS: 51 U/L (ref 40–150)
ALT: 14 U/L (ref 0–55)
AST: 25 U/L (ref 5–34)
Albumin: 3.7 g/dL (ref 3.5–5.0)
Anion Gap: 11 mEq/L (ref 3–11)
BUN: 11.7 mg/dL (ref 7.0–26.0)
CO2: 26 meq/L (ref 22–29)
Calcium: 9.4 mg/dL (ref 8.4–10.4)
Chloride: 104 mEq/L (ref 98–109)
Creatinine: 0.8 mg/dL (ref 0.6–1.1)
EGFR: 68 mL/min/{1.73_m2} — ABNORMAL LOW (ref >90)
GLUCOSE: 90 mg/dL (ref 70–140)
POTASSIUM: 4.2 meq/L (ref 3.5–5.1)
SODIUM: 140 meq/L (ref 136–145)
Total Bilirubin: 0.64 mg/dL (ref 0.20–1.20)
Total Protein: 6.8 g/dL (ref 6.4–8.3)

## 2014-12-18 LAB — LACTATE DEHYDROGENASE (CC13): LDH: 176 U/L (ref 125–245)

## 2014-12-18 LAB — FERRITIN CHCC: Ferritin: 79 ng/ml (ref 9–269)

## 2014-12-18 LAB — IRON AND TIBC CHCC
%SAT: 37 % (ref 21–57)
IRON: 105 ug/dL (ref 41–142)
TIBC: 284 ug/dL (ref 236–444)
UIBC: 179 ug/dL (ref 120–384)

## 2014-12-18 NOTE — Telephone Encounter (Signed)
gave and printed appt sched and avs for pt for April....no pof

## 2014-12-18 NOTE — Progress Notes (Signed)
[place] Telephone:(336) [phone]   Fax:(336) [phone]  CONSULT NOTE  REFERRING PHYSICIAN: Dr. Doreene Burke Nnodi  REASON FOR CONSULTATION:  81 years old white female with leukocytopenia.  HPI Tiffany Fox is a 81 y.o. female with past medical history significant for hypothyroidism, hemorrhoids, colon polyps. The patient was seen by her primary care physician recently complaining of diarrhea for several days. She had several studies performed at that time including CBC on 10/29/2014 that showed total white blood count of 3.0 with absolute lymphocyte count of 1300. she had normal hemoglobin of 13.1 and hematocrit 40.0% as well as normal platelet counts 172,000. The patient was referred to me today for evaluation and recommendation regarding her leukocytopenia. She was also referred to gastroenterology for evaluation of her diarrhea and she had a colonoscopy performed but this result is not available for me. She is currently on Imodium and feeling a little bit better. She has no history of hepatitis or HIV. The patient also denied having any rheumatologic disorders. When seen today she continues to have mild diarrhea with bloating and gas production as well as stomach cramps. She also has a history of bleeding hemorrhoids. She does not take any NSAIDs. Review of the previous record showed that the patient had similar episodes of leukocytopenia and 2010, 2011 and 2013. The patient denied having any significant chest pain, shortness breath, cough or hemoptysis. She denied having any current fever or chills. Family history significant for father who had cerebellar aneurysm and 2 brothers with heart disease in their 75s. Mother died at age 2. The patient is a widow and has 3 children 1 son and 2 daughters. 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 She has no history of smoking but she drinks white wine every night. No history of drug abuse.  HPI  Past Medical History    Diagnosis Date  . Hypertension     No past surgical history on file.  No family history on file.  Social History History  Substance Use Topics  . Smoking status: Never Smoker   . Smokeless tobacco: Not on file  . Alcohol Use: No    Allergies  Allergen Reactions  . Lisinopril Rash    Current Outpatient Prescriptions  Medication Sig Dispense Refill  . cholecalciferol (VITAMIN D) 1000 UNITS tablet Take 2,000 Units by mouth daily. With K1     . MAGNESIUM GLYCINATE PLUS PO Take 250 mg by mouth at bedtime.    . NON FORMULARY Take 600 mg by mouth daily.    Marland Kitchen omega-3 acid ethyl esters (LOVAZA) 1 G capsule Take 1 g by mouth 2 (two) times daily.      Marland Kitchen OVER THE COUNTER MEDICATION Take 1 tablet by mouth 2 (two) times daily.    Marland Kitchen thyroid (ARMOUR) 60 MG tablet Take 60 mg by mouth daily.      . traZODone (DESYREL) 100 MG tablet Take 100 mg by mouth at bedtime. For sleep      No current facility-administered medications for this visit.    Review of Systems  Constitutional: positive for fatigue Eyes: negative Ears, nose, mouth, throat, and face: negative Respiratory: negative Cardiovascular: negative Gastrointestinal: positive for abdominal pain, change in bowel habits and diarrhea Genitourinary:negative Integument/breast: negative Hematologic/lymphatic: negative Musculoskeletal:negative Neurological: negative Behavioral/Psych: positive for sleep disturbance Endocrine: negative Allergic/Immunologic: negative  Physical Exam  HGD:JMEQA, healthy, no distress, well nourished and well developed SKIN: skin color, texture, turgor are normal, no rashes  or significant lesions HEAD: Normocephalic, No masses, lesions, tenderness or abnormalities EYES: normal, PERRLA, Conjunctiva are pink and non-injected EARS: External ears normal, Canals clear OROPHARYNX:no exudate, no erythema and lips, buccal mucosa, and tongue normal  NECK: supple, no adenopathy, no JVD LYMPH:  no palpable  lymphadenopathy, no hepatosplenomegaly BREAST:not examined LUNGS: clear to auscultation , and palpation HEART: regular rate & rhythm and no murmurs ABDOMEN:abdomen soft, non-tender, normal bowel sounds and no masses or organomegaly BACK: Back symmetric, no curvature., No CVA tenderness EXTREMITIES:no joint deformities, effusion, or inflammation, no edema, no skin discoloration  NEURO: alert & oriented x 3 with fluent speech, no focal motor/sensory deficits  PERFORMANCE STATUS: ECOG 1  LABORATORY DATA: Lab Results  Component Value Date   WBC 2.6* 12/18/2014   HGB 13.1 12/18/2014   HCT 38.7 12/18/2014   MCV 96.3 12/18/2014   PLT 185 12/18/2014      Chemistry      Component Value Date/Time   NA 140 12/18/2014 1146   NA 139 09/01/2011 1835   K 4.2 12/18/2014 1146   K 4.4 09/01/2011 1835   CL 104 09/01/2011 1835   CO2 26 12/18/2014 1146   CO2 25 11/21/2010 0428   BUN 11.7 12/18/2014 1146   BUN 26* 09/01/2011 1835   CREATININE 0.8 12/18/2014 1146   CREATININE 0.90 09/01/2011 1835      Component Value Date/Time   CALCIUM 9.4 12/18/2014 1146   CALCIUM 8.7 11/21/2010 0428   ALKPHOS 51 12/18/2014 1146   ALKPHOS 46 02/26/2010 0440   AST 25 12/18/2014 1146   AST 23 02/26/2010 0440   ALT 14 12/18/2014 1146   ALT 20 02/26/2010 0440   BILITOT 0.64 12/18/2014 1146   BILITOT 0.8 02/26/2010 0440       RADIOGRAPHIC STUDIES: No results found.  ASSESSMENT: This is a very pleasant 81 years old white female with leukocytopenia of unknown etiology. She had similar episodes of leukocytopenia over the last few years with recovery of her total white blood count in between.   PLAN: I had a lengthy discussion with the patient today about her current condition and further investigation to identify the etiology of her leukocytopenia. I ordered several studies today including repeat CBC with differential, comprehensive metabolic panel, vitamin B 12, serum folate, rheumatoid factor and ANA.     If no clear etiology for her leukocytopenia, I would consider the patient for a bone marrow biopsy and aspirate. I would see the patient back for follow-up visit in 4 weeks for reevaluation and repeat CBC For the diarrhea, she will continue on Imodium for now with follow-up with her gastroenterologist. The patient was advised to call immediately if she has any concerning symptoms in the interval. The patient voices understanding of current disease status and treatment options and is in agreement with the current care plan.  All questions were answered. The patient knows to call the clinic with any problems, questions or concerns. We can certainly see the patient much sooner if necessary.  Thank you so much for allowing me to participate in the care of Tiffany Fox. I will continue to follow up the patient with you and assist in her care.  I spent 40 minutes counseling the patient face to face. The total time spent in the appointment was 60 minutes.  Disclaimer: This note was dictated with voice recognition software. Similar sounding words can inadvertently be transcribed and may not be corrected upon review.   Emmogene Simson K. December 18, 2014, 5:15  PM

## 2014-12-18 NOTE — Progress Notes (Signed)
Checked in new pt with no financial concerns. °

## 2014-12-20 LAB — ANA: Anti Nuclear Antibody(ANA): POSITIVE — AB

## 2014-12-20 LAB — FOLATE

## 2014-12-20 LAB — VITAMIN B12: Vitamin B-12: 734 pg/mL (ref 211–911)

## 2014-12-20 LAB — RHEUMATOID FACTOR

## 2014-12-20 LAB — ANTI-NUCLEAR AB-TITER (ANA TITER)

## 2014-12-31 DIAGNOSIS — D72819 Decreased white blood cell count, unspecified: Secondary | ICD-10-CM | POA: Diagnosis not present

## 2014-12-31 DIAGNOSIS — R197 Diarrhea, unspecified: Secondary | ICD-10-CM | POA: Diagnosis not present

## 2015-01-08 ENCOUNTER — Other Ambulatory Visit (HOSPITAL_BASED_OUTPATIENT_CLINIC_OR_DEPARTMENT_OTHER): Payer: Medicare Other

## 2015-01-08 ENCOUNTER — Telehealth: Payer: Self-pay | Admitting: Internal Medicine

## 2015-01-08 ENCOUNTER — Ambulatory Visit (HOSPITAL_BASED_OUTPATIENT_CLINIC_OR_DEPARTMENT_OTHER): Payer: Medicare Other | Admitting: Internal Medicine

## 2015-01-08 ENCOUNTER — Other Ambulatory Visit: Payer: Self-pay | Admitting: *Deleted

## 2015-01-08 VITALS — BP 137/65 | HR 68 | Temp 97.6°F | Resp 18 | Ht 64.0 in | Wt 132.1 lb

## 2015-01-08 DIAGNOSIS — D709 Neutropenia, unspecified: Secondary | ICD-10-CM | POA: Diagnosis not present

## 2015-01-08 DIAGNOSIS — D72819 Decreased white blood cell count, unspecified: Secondary | ICD-10-CM | POA: Diagnosis not present

## 2015-01-08 LAB — CBC WITH DIFFERENTIAL/PLATELET
BASO%: 1.1 % (ref 0.0–2.0)
BASOS ABS: 0 10*3/uL (ref 0.0–0.1)
EOS ABS: 0.2 10*3/uL (ref 0.0–0.5)
EOS%: 6.4 % (ref 0.0–7.0)
HCT: 39.4 % (ref 34.8–46.6)
HEMOGLOBIN: 13.2 g/dL (ref 11.6–15.9)
LYMPH#: 1.3 10*3/uL (ref 0.9–3.3)
LYMPH%: 50.4 % — ABNORMAL HIGH (ref 14.0–49.7)
MCH: 32.6 pg (ref 25.1–34.0)
MCHC: 33.5 g/dL (ref 31.5–36.0)
MCV: 97.3 fL (ref 79.5–101.0)
MONO#: 0.4 10*3/uL (ref 0.1–0.9)
MONO%: 13.9 % (ref 0.0–14.0)
NEUT%: 28.2 % — ABNORMAL LOW (ref 38.4–76.8)
NEUTROS ABS: 0.8 10*3/uL — AB (ref 1.5–6.5)
Platelets: 169 10*3/uL (ref 145–400)
RBC: 4.05 10*6/uL (ref 3.70–5.45)
RDW: 14.2 % (ref 11.2–14.5)
WBC: 2.7 10*3/uL — AB (ref 3.9–10.3)

## 2015-01-08 LAB — COMPREHENSIVE METABOLIC PANEL (CC13)
ALT: 12 U/L (ref 0–55)
ANION GAP: 8 meq/L (ref 3–11)
AST: 25 U/L (ref 5–34)
Albumin: 3.6 g/dL (ref 3.5–5.0)
Alkaline Phosphatase: 53 U/L (ref 40–150)
BILIRUBIN TOTAL: 0.57 mg/dL (ref 0.20–1.20)
BUN: 13 mg/dL (ref 7.0–26.0)
CALCIUM: 8.9 mg/dL (ref 8.4–10.4)
CHLORIDE: 106 meq/L (ref 98–109)
CO2: 26 meq/L (ref 22–29)
CREATININE: 0.9 mg/dL (ref 0.6–1.1)
EGFR: 64 mL/min/{1.73_m2} — AB (ref >90)
GLUCOSE: 88 mg/dL (ref 70–140)
Potassium: 4.6 mEq/L (ref 3.5–5.1)
Sodium: 140 mEq/L (ref 136–145)
Total Protein: 6.4 g/dL (ref 6.4–8.3)

## 2015-01-08 NOTE — Telephone Encounter (Signed)
gave and printed appt sched and avs for pt for May....lvm for [facility] lvm for [facility] [phone].Marland KitchenMarland KitchenMarland Kitchen

## 2015-01-08 NOTE — Progress Notes (Signed)
[place] Telephone:([phone]   Fax:(336) [phone]  OFFICE PROGRESS NOTE  Gavin Pound, MD [place] [address]  DIAGNOSIS: Persistent leukocytopenia and neutropenia  PRIOR THERAPY: None  CURRENT THERAPY: None  INTERVAL HISTORY: Tiffany Fox 81 y.o. female returns to the clinic today for follow-up visit. The patient is feeling fine today with no specific complaints. She denied having any significant weight loss or night sweats. The patient denied having any significant chest pain, shortness breath, cough or hemoptysis. She had several studies for evaluation of her persistent leukocytopenia including iron study, ferritin, vitamin B 12, serum folate, rheumatoid factor that were unremarkable. ANA was positive with a titer of 1:80 and a speckled pattern. The patient had repeat CBC performed earlier today and she is here for evaluation and discussion of her lab results.  MEDICAL HISTORY: Past Medical History  Diagnosis Date  . Hypertension   . Leukocytopenia     ALLERGIES:  is allergic to lisinopril.  MEDICATIONS:  Current Outpatient Prescriptions  Medication Sig Dispense Refill  . cholecalciferol (VITAMIN D) 1000 UNITS tablet Take 2,000 Units by mouth daily. With K1     . MAGNESIUM GLYCINATE PLUS PO Take 250 mg by mouth at bedtime.    . NON FORMULARY Take 600 mg by mouth daily.    Marland Kitchen omega-3 acid ethyl esters (LOVAZA) 1 G capsule Take 1 g by mouth 2 (two) times daily.      Marland Kitchen OVER THE COUNTER MEDICATION Take 1 tablet by mouth 2 (two) times daily.    Marland Kitchen thyroid (ARMOUR) 60 MG tablet Take 75 mg by mouth daily. 77m pill + 188mpill total of 75 mg daily    . traZODone (DESYREL) 100 MG tablet Take 100 mg by mouth at bedtime. For sleep      No current facility-administered medications for this visit.    SURGICAL HISTORY: History reviewed. No pertinent past surgical history.  REVIEW OF SYSTEMS:  A comprehensive review of systems was  negative.   PHYSICAL EXAMINATION: General appearance: alert, cooperative and no distress Head: Normocephalic, without obvious abnormality, atraumatic Neck: no adenopathy, no JVD, supple, symmetrical, trachea midline and thyroid not enlarged, symmetric, no tenderness/mass/nodules Lymph nodes: Cervical, supraclavicular, and axillary nodes normal. Resp: clear to auscultation bilaterally Back: symmetric, no curvature. ROM normal. No CVA tenderness. Cardio: regular rate and rhythm, S1, S2 normal, no murmur, click, rub or gallop GI: soft, non-tender; bowel sounds normal; no masses,  no organomegaly Extremities: extremities normal, atraumatic, no cyanosis or edema  ECOG PERFORMANCE STATUS: 0 - Asymptomatic  Blood pressure 137/65, pulse 68, temperature 97.6 F (36.4 C), temperature source Oral, resp. rate 18, height 5' 4"  (1.626 m), weight 132 lb 1.6 oz (59.92 kg), SpO2 98 %.  LABORATORY DATA: Lab Results  Component Value Date   WBC 2.7* 01/08/2015   HGB 13.2 01/08/2015   HCT 39.4 01/08/2015   MCV 97.3 01/08/2015   PLT 169 01/08/2015      Chemistry      Component Value Date/Time   NA 140 12/18/2014 1146   NA 139 09/01/2011 1835   K 4.2 12/18/2014 1146   K 4.4 09/01/2011 1835   CL 104 09/01/2011 1835   CO2 26 12/18/2014 1146   CO2 25 11/21/2010 0428   BUN 11.7 12/18/2014 1146   BUN 26* 09/01/2011 1835   CREATININE 0.8 12/18/2014 1146   CREATININE 0.90 09/01/2011 1835      Component Value Date/Time  CALCIUM 9.4 12/18/2014 1146   CALCIUM 8.7 11/21/2010 0428   ALKPHOS 51 12/18/2014 1146   ALKPHOS 46 02/26/2010 0440   AST 25 12/18/2014 1146   AST 23 02/26/2010 0440   ALT 14 12/18/2014 1146   ALT 20 02/26/2010 0440   BILITOT 0.64 12/18/2014 1146   BILITOT 0.8 02/26/2010 0440       RADIOGRAPHIC STUDIES: No results found.  ASSESSMENT AND PLAN: This is a very pleasant 81 years old white female with persistent leukocytopenia and neutropenia of unknown etiology. The only  positive finding so far is the positive ANA but I'm not sure if this would explain her leukocytopenia. I discussed the lab result with the patient today. I recommended for her referral to rheumatology for evaluation. I will also arrange for the patient to have CT-guided bone marrow biopsy and aspirate to rule out any bone marrow abnormality. I will see the patient back for follow-up visit in one month for reevaluation and discussion of her biopsy results and further recommendation regarding her condition. She was advised to call immediately if she has any concerning symptoms in the interval. The patient voices understanding of current disease status and treatment options and is in agreement with the current care plan.  All questions were answered. The patient knows to call the clinic with any problems, questions or concerns. We can certainly see the patient much sooner if necessary.  Disclaimer: This note was dictated with voice recognition software. Similar sounding words can inadvertently be transcribed and may not be corrected upon review.

## 2015-01-09 ENCOUNTER — Telehealth: Payer: Self-pay | Admitting: Internal Medicine

## 2015-01-09 NOTE — Telephone Encounter (Signed)
Faxed pt medical records to [place] [phone]

## 2015-01-29 ENCOUNTER — Other Ambulatory Visit: Payer: Self-pay | Admitting: Radiology

## 2015-01-30 ENCOUNTER — Ambulatory Visit (HOSPITAL_COMMUNITY)
Admission: RE | Admit: 2015-01-30 | Discharge: 2015-01-30 | Disposition: A | Discharge status: Home / Self Care | Payer: Medicare Other | Source: Ambulatory Visit | Attending: Internal Medicine | Admitting: Internal Medicine

## 2015-01-30 ENCOUNTER — Encounter (HOSPITAL_COMMUNITY): Payer: Self-pay

## 2015-01-30 DIAGNOSIS — D61818 Other pancytopenia: Secondary | ICD-10-CM | POA: Insufficient documentation

## 2015-01-30 DIAGNOSIS — D709 Neutropenia, unspecified: Secondary | ICD-10-CM | POA: Diagnosis not present

## 2015-01-30 DIAGNOSIS — I10 Essential (primary) hypertension: Secondary | ICD-10-CM | POA: Diagnosis not present

## 2015-01-30 DIAGNOSIS — D72810 Lymphocytopenia: Secondary | ICD-10-CM | POA: Diagnosis not present

## 2015-01-30 DIAGNOSIS — D72819 Decreased white blood cell count, unspecified: Secondary | ICD-10-CM | POA: Insufficient documentation

## 2015-01-30 DIAGNOSIS — D72820 Lymphocytosis (symptomatic): Secondary | ICD-10-CM | POA: Diagnosis not present

## 2015-01-30 LAB — CBC
HCT: 41 % (ref 36.0–46.0)
Hemoglobin: 13.6 g/dL (ref 12.0–15.0)
MCH: 32 pg (ref 26.0–34.0)
MCHC: 33.2 g/dL (ref 30.0–36.0)
MCV: 96.5 fL (ref 78.0–100.0)
PLATELETS: 194 10*3/uL (ref 150–400)
RBC: 4.25 MIL/uL (ref 3.87–5.11)
RDW: 14 % (ref 11.5–15.5)
WBC: 2.7 10*3/uL — ABNORMAL LOW (ref 4.0–10.5)

## 2015-01-30 LAB — PROTIME-INR
INR: 0.9 (ref 0.00–1.49)
PROTHROMBIN TIME: 12.2 s (ref 11.6–15.2)

## 2015-01-30 LAB — BONE MARROW EXAM

## 2015-01-30 LAB — APTT: aPTT: 26 seconds (ref 24–37)

## 2015-01-30 MED ORDER — SODIUM CHLORIDE 0.9 % IV SOLN
Freq: Once | INTRAVENOUS | Status: AC
  Administered 2015-01-30: 500 mL via INTRAVENOUS

## 2015-01-30 MED ORDER — MIDAZOLAM HCL 2 MG/2ML IJ SOLN
INTRAMUSCULAR | Status: AC
Start: 2015-01-30 — End: 2015-01-30
  Filled 2015-01-30: qty 6

## 2015-01-30 MED ORDER — FENTANYL CITRATE (PF) 100 MCG/2ML IJ SOLN
INTRAMUSCULAR | Status: AC
Start: 2015-01-30 — End: 2015-01-30
  Filled 2015-01-30: qty 4

## 2015-01-30 MED ORDER — MIDAZOLAM HCL 2 MG/2ML IJ SOLN
INTRAMUSCULAR | Status: AC | PRN
  Administered 2015-01-30 (×3): 1 mg via INTRAVENOUS

## 2015-01-30 MED ORDER — FENTANYL CITRATE (PF) 100 MCG/2ML IJ SOLN
INTRAMUSCULAR | Status: AC | PRN
  Administered 2015-01-30: 25 ug via INTRAVENOUS
  Administered 2015-01-30: 50 ug via INTRAVENOUS
  Administered 2015-01-30: 25 ug via INTRAVENOUS

## 2015-01-30 NOTE — Procedures (Signed)
R iliac BM aspirate and core No comp

## 2015-01-30 NOTE — Discharge Instructions (Signed)
Bone Marrow Aspiration, Bone Marrow Biopsy °Care After °Read the instructions outlined below and refer to this sheet in the next few weeks. These discharge instructions provide you with general information on caring for yourself after you leave the hospital. Your caregiver may also give you specific instructions. While your treatment has been planned according to the most current medical practices available, unavoidable complications occasionally occur. If you have any problems or questions after discharge, call your caregiver. °FINDING OUT THE RESULTS OF YOUR TEST °Not all test results are available during your visit. If your test results are not back during the visit, make an appointment with your caregiver to find out the results. Do not assume everything is normal if you have not heard from your caregiver or the medical facility. It is important for you to follow up on all of your test results.  °HOME CARE INSTRUCTIONS  °You have had sedation and may be sleepy or dizzy. Your thinking may not be as clear as usual. For the next 24 hours: °· Only take over-the-counter or prescription medicines for pain, discomfort, and or fever as directed by your caregiver. °· Do not drink alcohol. °· Do not smoke. °· Do not drive. °· Do not make important legal decisions. °· Do not operate heavy machinery. °· Do not care for small children by yourself. °· Keep your dressing clean and dry. You may replace dressing with a bandage after 24 hours. °· You may take a bath or shower after 24 hours. °· Use an ice pack for 20 minutes every 2 hours while awake for pain as needed. °SEEK MEDICAL CARE IF:  °· There is redness, swelling, or increasing pain at the biopsy site. °· There is pus coming from the biopsy site. °· There is drainage from a biopsy site lasting longer than one day. °· An unexplained oral temperature above 102° F (38.9° C) develops. °SEEK IMMEDIATE MEDICAL CARE IF:  °· You develop a rash. °· You have difficulty  breathing. °· You develop any reaction or side effects to medications given. °Document Released: 04/10/2005 Document Revised: 12/14/2011 Document Reviewed: 09/18/2008 °[employer]® Patient Information ©2015 [employer]. This information is not intended to replace advice given to you by your health care provider. Make sure you discuss any questions you have with your health care provider. °Conscious Sedation °Sedation is the use of medicines to promote relaxation and relieve discomfort and anxiety. Conscious sedation is a type of sedation. Under conscious sedation you are less alert than normal but are still able to respond to instructions or stimulation. Conscious sedation is used during short medical and dental procedures. It is milder than deep sedation or general anesthesia and allows you to return to your regular activities sooner.  °LET YOUR HEALTH CARE PROVIDER KNOW ABOUT:  °· Any allergies you have. °· All medicines you are taking, including vitamins, herbs, eye drops, creams, and over-the-counter medicines. °· Use of steroids (by mouth or creams). °· Previous problems you or members of your family have had with the use of anesthetics. °· Any blood disorders you have. °· Previous surgeries you have had. °· Medical conditions you have. °· Possibility of pregnancy, if this applies. °· Use of cigarettes, alcohol, or illegal drugs. °RISKS AND COMPLICATIONS °Generally, this is a safe procedure. However, as with any procedure, problems can occur. Possible problems include: °· Oversedation. °· Trouble breathing on your own. You may need to have a breathing tube until you are awake and breathing on your own. °· Allergic reaction   to any of the medicines used for the procedure. BEFORE THE PROCEDURE  You may have blood tests done. These tests can help show how well your kidneys and liver are working. They can also show how well your blood clots.  A physical exam will be done.  Only take medicines as directed by  your health care provider. You may need to stop taking medicines (such as blood thinners, aspirin, or nonsteroidal anti-inflammatory drugs) before the procedure.   Do not eat or drink at least 6 hours before the procedure or as directed by your health care provider.  Arrange for a responsible adult, family member, or friend to take you home after the procedure. He or she should stay with you for at least 24 hours after the procedure, until the medicine has worn off. PROCEDURE   An intravenous (IV) catheter will be inserted into one of your veins. Medicine will be able to flow directly into your body through this catheter. You may be given medicine through this tube to help prevent pain and help you relax.  The medical or dental procedure will be done. AFTER THE PROCEDURE  You will stay in a recovery area until the medicine has worn off. Your blood pressure and pulse will be checked.   Depending on the procedure you had, you may be allowed to go home when you can tolerate liquids and your pain is under control. Document Released: 06/16/2001 Document Revised: 09/26/2013 Document Reviewed: 05/29/2013 [facility] Patient Information 2015 [place], [place]. This information is not intended to replace advice given to you by your health care provider. Make sure you discuss any questions you have with your health care provider. Conscious Sedation, Adult, Care After Refer to this sheet in the next few weeks. These instructions provide you with information on caring for yourself after your procedure. Your health care provider may also give you more specific instructions. Your treatment has been planned according to current medical practices, but problems sometimes occur. Call your health care provider if you have any problems or questions after your procedure. WHAT TO EXPECT AFTER THE PROCEDURE  After your procedure:  You may feel sleepy, clumsy, and have poor balance for several hours.  Vomiting may  occur if you eat too soon after the procedure. HOME CARE INSTRUCTIONS  Do not participate in any activities where you could become injured for at least 24 hours. Do not:  Drive.  Swim.  Ride a bicycle.  Operate heavy machinery.  Cook.  Use power tools.  Climb ladders.  Work from a high place.  Do not make important decisions or sign legal documents until you are improved.  If you vomit, drink water, juice, or soup when you can drink without vomiting. Make sure you have little or no nausea before eating solid foods.  Only take over-the-counter or prescription medicines for pain, discomfort, or fever as directed by your health care provider.  Make sure you and your family fully understand everything about the medicines given to you, including what side effects may occur.  You should not drink alcohol, take sleeping pills, or take medicines that cause drowsiness for at least 24 hours.  If you smoke, do not smoke without supervision.  If you are feeling better, you may resume normal activities 24 hours after you were sedated.  Keep all appointments with your health care provider. SEEK MEDICAL CARE IF:  Your skin is pale or bluish in color.  You continue to feel nauseous or vomit.  Your pain  is getting worse and is not helped by medicine.  You have bleeding or swelling.  You are still sleepy or feeling clumsy after 24 hours. SEEK IMMEDIATE MEDICAL CARE IF:  You develop a rash.  You have difficulty breathing.  You develop any type of allergic problem.  You have a fever. MAKE SURE YOU:  Understand these instructions.  Will watch your condition.  Will get help right away if you are not doing well or get worse. Document Released: 07/12/2013 Document Reviewed: 07/12/2013 [facility] Patient Information 2015 [place], [place]. This information is not intended to replace advice given to you by your health care provider. Make sure you discuss any questions you have with  your health care provider.

## 2015-01-30 NOTE — H&P (Signed)
Chief Complaint: "I'm having a bone marrow biopsy"  Referring Physician(s): Mohamed,Mohamed  History of Present Illness: Tiffany Fox is a 81 y.o. female with persistent leukocytopenia and neutropenia of unknown etiology who presents today for CT-guided bone marrow biopsy for further evaluation.  Past Medical History  Diagnosis Date  . Hypertension   . Leukocytopenia     Past Surgical History  Procedure Laterality Date  . Hip fractuer Left 05.2011    Allergies: Lisinopril  Medications: Prior to Admission medications   Medication Sig Start Date End Date Taking? Authorizing Provider  cholecalciferol (VITAMIN D) 1000 UNITS tablet Take 2,000 Units by mouth daily.    Yes Historical Provider, MD  Multiple Vitamins-Minerals (HAIR/SKIN/NAILS PO) Take 1 tablet by mouth daily.   Yes Historical Provider, MD  omega-3 acid ethyl esters (LOVAZA) 1 G capsule Take 1 g by mouth 2 (two) times daily.     Yes Historical Provider, MD  OVER THE COUNTER MEDICATION Take 1 tablet by mouth 2 (two) times daily.   Yes Historical Provider, MD  thyroid (ARMOUR) 15 MG tablet Take 75 mg by mouth daily.   Yes Historical Provider, MD  thyroid (ARMOUR) 60 MG tablet Take 75 mg by mouth daily. 32m pill + 144mpill total of 75 mg daily   Yes Historical Provider, MD  traZODone (DESYREL) 100 MG tablet Take 100 mg by mouth at bedtime. For sleep    Yes Historical Provider, MD    History reviewed. No pertinent family history.  History   Social History  . Marital Status: Widowed    Spouse Name: N/A  . Number of Children: N/A  . Years of Education: N/A   Social History Main Topics  . Smoking status: Never Smoker   . Smokeless tobacco: Not on file  . Alcohol Use: 4.2 oz/week    7 Glasses of wine per week  . Drug Use: No  . Sexual Activity: Not on file   Other Topics Concern  . None   Social History Narrative     Review of Systems  Constitutional: Negative for fever, chills and unexpected  weight change.  Respiratory: Negative for cough and shortness of breath.   Cardiovascular: Negative for chest pain.  Gastrointestinal: Negative for nausea, vomiting, abdominal pain and blood in stool.  Genitourinary: Negative for dysuria and hematuria.  Musculoskeletal: Positive for arthralgias.       Occ left hip pain  Neurological: Negative for headaches.    Vital Signs: BP 185/76 mmHg  Pulse 71  Temp(Src) 97.8 F (36.6 C) (Oral)  Resp 18  Ht 5' 3.5" (1.613 m)  Wt 130 lb (58.968 kg)  BMI 22.66 kg/m2  SpO2 99%  Physical Exam  Constitutional: She is oriented to person, place, and time. She appears well-developed and well-nourished.  Cardiovascular: Normal rate and regular rhythm.   Pulmonary/Chest: Effort normal and breath sounds normal.  Abdominal: Soft. Bowel sounds are normal. There is no tenderness.  Musculoskeletal: Normal range of motion. She exhibits no edema.  Neurological: She is alert and oriented to person, place, and time.    Imaging: No results found.  Labs:  CBC:  Recent Labs  12/18/14 1146 01/08/15 0912 01/30/15 0834  WBC 2.6* 2.7* 2.7*  HGB 13.1 13.2 13.6  HCT 38.7 39.4 41.0  PLT 185 169 194    COAGS:  Recent Labs  01/30/15 0834  INR 0.90  APTT 26    BMP:  Recent Labs  12/18/14 1146 01/08/15 0912  NA 140  140  K 4.2 4.6  CO2 26 26  GLUCOSE 90 88  BUN 11.7 13.0  CALCIUM 9.4 8.9  CREATININE 0.8 0.9    LIVER FUNCTION TESTS:  Recent Labs  12/18/14 1146 01/08/15 0912  BILITOT 0.64 0.57  AST 25 25  ALT 14 12  ALKPHOS 51 53  PROT 6.8 6.4  ALBUMIN 3.7 3.6    TUMOR MARKERS: No results for input(s): AFPTM, CEA, CA199, CHROMGRNA in the last 8760 hours.  Assessment and Plan: Tiffany Fox is a 81 y.o. female with persistent leukocytopenia and neutropenia of unknown etiology who presents today for CT-guided bone marrow biopsy for further evaluation. Risks and benefits discussed with the patient /son including, but not  limited to bleeding, infection, damage to adjacent structures or low yield requiring additional tests. All of the patient's questions were answered, patient is agreeable to proceed. Consent signed and in chart.       Signed: Autumn Messing 01/30/2015, 9:47 AM   I spent a total of 20 minutes face to face in clinical consultation, greater than 50% of which was counseling/coordinating care for CT-guided bone marrow biopsy.

## 2015-02-01 ENCOUNTER — Telehealth: Payer: Self-pay

## 2015-02-01 NOTE — Telephone Encounter (Signed)
Called pt back, there was a voice message left. Electronic appt reminder.

## 2015-02-04 ENCOUNTER — Other Ambulatory Visit: Payer: Self-pay | Admitting: Medical Oncology

## 2015-02-04 DIAGNOSIS — D72819 Decreased white blood cell count, unspecified: Secondary | ICD-10-CM

## 2015-02-05 ENCOUNTER — Ambulatory Visit (HOSPITAL_BASED_OUTPATIENT_CLINIC_OR_DEPARTMENT_OTHER): Payer: Medicare Other | Admitting: Internal Medicine

## 2015-02-05 ENCOUNTER — Encounter: Payer: Self-pay | Admitting: Internal Medicine

## 2015-02-05 ENCOUNTER — Other Ambulatory Visit (HOSPITAL_BASED_OUTPATIENT_CLINIC_OR_DEPARTMENT_OTHER): Payer: Medicare Other

## 2015-02-05 VITALS — BP 170/63 | HR 73 | Temp 97.8°F | Resp 18 | Ht 63.5 in | Wt 130.9 lb

## 2015-02-05 DIAGNOSIS — L821 Other seborrheic keratosis: Secondary | ICD-10-CM | POA: Diagnosis not present

## 2015-02-05 DIAGNOSIS — L570 Actinic keratosis: Secondary | ICD-10-CM | POA: Diagnosis not present

## 2015-02-05 DIAGNOSIS — D72819 Decreased white blood cell count, unspecified: Secondary | ICD-10-CM

## 2015-02-05 DIAGNOSIS — D709 Neutropenia, unspecified: Secondary | ICD-10-CM

## 2015-02-05 DIAGNOSIS — Z85828 Personal history of other malignant neoplasm of skin: Secondary | ICD-10-CM | POA: Diagnosis not present

## 2015-02-05 LAB — COMPREHENSIVE METABOLIC PANEL (CC13)
ALK PHOS: 59 U/L (ref 40–150)
ALT: 13 U/L (ref 0–55)
ANION GAP: 8 meq/L (ref 3–11)
AST: 23 U/L (ref 5–34)
Albumin: 3.7 g/dL (ref 3.5–5.0)
BUN: 10.3 mg/dL (ref 7.0–26.0)
CO2: 27 mEq/L (ref 22–29)
Calcium: 9.3 mg/dL (ref 8.4–10.4)
Chloride: 103 mEq/L (ref 98–109)
Creatinine: 0.8 mg/dL (ref 0.6–1.1)
EGFR: 70 mL/min/{1.73_m2} — AB (ref >90)
GLUCOSE: 94 mg/dL (ref 70–140)
POTASSIUM: 4.5 meq/L (ref 3.5–5.1)
SODIUM: 139 meq/L (ref 136–145)
Total Bilirubin: 0.56 mg/dL (ref 0.20–1.20)
Total Protein: 6.6 g/dL (ref 6.4–8.3)

## 2015-02-05 LAB — CBC WITH DIFFERENTIAL/PLATELET
BASO%: 1.1 % (ref 0.0–2.0)
BASOS ABS: 0 10*3/uL (ref 0.0–0.1)
EOS ABS: 0.2 10*3/uL (ref 0.0–0.5)
EOS%: 5.5 % (ref 0.0–7.0)
HEMATOCRIT: 39.6 % (ref 34.8–46.6)
HGB: 13.6 g/dL (ref 11.6–15.9)
LYMPH%: 50 % — ABNORMAL HIGH (ref 14.0–49.7)
MCH: 33.2 pg (ref 25.1–34.0)
MCHC: 34.3 g/dL (ref 31.5–36.0)
MCV: 96.6 fL (ref 79.5–101.0)
MONO#: 0.4 10*3/uL (ref 0.1–0.9)
MONO%: 13.6 % (ref 0.0–14.0)
NEUT%: 29.8 % — ABNORMAL LOW (ref 38.4–76.8)
NEUTROS ABS: 0.8 10*3/uL — AB (ref 1.5–6.5)
Platelets: 171 10*3/uL (ref 145–400)
RBC: 4.1 10*6/uL (ref 3.70–5.45)
RDW: 13.8 % (ref 11.2–14.5)
WBC: 2.7 10*3/uL — AB (ref 3.9–10.3)
lymph#: 1.4 10*3/uL (ref 0.9–3.3)

## 2015-02-05 NOTE — Progress Notes (Signed)
[facility] Telephone:(336) [phone]   Fax:(336) [phone]  OFFICE PROGRESS NOTE  Gavin Pound, MD [place] [address]  DIAGNOSIS: Persistent leukocytopenia and neutropenia  PRIOR THERAPY: None  CURRENT THERAPY: None  INTERVAL HISTORY: Tiffany Fox 81 y.o. female returns to the clinic today for follow-up visit. The patient is feeling fine today with no specific complaints. She denied having any significant weight loss or night sweats. The patient denied having any significant chest pain, shortness breath, cough or hemoptysis. She had several studies for evaluation of her persistent leukocytopenia including iron study, ferritin, vitamin B 12, serum folate, rheumatoid factor that were unremarkable. ANA was positive with a titer of 1:80 and a speckled pattern. The patient had a recent bone marrow biopsy and aspirate performed and she is here for evaluation and discussion of her biopsy and lab results.   MEDICAL HISTORY: Past Medical History  Diagnosis Date  . Hypertension   . Leukocytopenia     ALLERGIES:  is allergic to lisinopril.  MEDICATIONS:  Current Outpatient Prescriptions  Medication Sig Dispense Refill  . cholecalciferol (VITAMIN D) 1000 UNITS tablet Take 2,000 Units by mouth daily.     . Multiple Vitamins-Minerals (HAIR/SKIN/NAILS PO) Take 1 tablet by mouth daily.    Marland Kitchen omega-3 acid ethyl esters (LOVAZA) 1 G capsule Take 1 g by mouth 2 (two) times daily.      Marland Kitchen OVER THE COUNTER MEDICATION Take 1 tablet by mouth 2 (two) times daily.    . Probiotic Product (PROBIOTIC DAILY PO) Take 1 each by mouth.    . thyroid (ARMOUR) 15 MG tablet Take 75 mg by mouth daily.    Marland Kitchen thyroid (ARMOUR) 60 MG tablet Take 75 mg by mouth daily. 73m pill + 176mpill total of 75 mg daily    . traZODone (DESYREL) 100 MG tablet Take 100 mg by mouth at bedtime. For sleep      No current facility-administered medications for this visit.    SURGICAL  HISTORY:  Past Surgical History  Procedure Laterality Date  . Hip fractuer Left 05.2011    REVIEW OF SYSTEMS:  A comprehensive review of systems was negative.   PHYSICAL EXAMINATION: General appearance: alert, cooperative and no distress Head: Normocephalic, without obvious abnormality, atraumatic Neck: no adenopathy, no JVD, supple, symmetrical, trachea midline and thyroid not enlarged, symmetric, no tenderness/mass/nodules Lymph nodes: Cervical, supraclavicular, and axillary nodes normal. Resp: clear to auscultation bilaterally Back: symmetric, no curvature. ROM normal. No CVA tenderness. Cardio: regular rate and rhythm, S1, S2 normal, no murmur, click, rub or gallop GI: soft, non-tender; bowel sounds normal; no masses,  no organomegaly Extremities: extremities normal, atraumatic, no cyanosis or edema  ECOG PERFORMANCE STATUS: 0 - Asymptomatic  Blood pressure 170/63, pulse 73, temperature 97.8 F (36.6 C), temperature source Oral, resp. rate 18, height 5' 3.5" (1.613 m), weight 130 lb 14.4 oz (59.376 kg), SpO2 99 %.  LABORATORY DATA: Lab Results  Component Value Date   WBC 2.7* 02/05/2015   HGB 13.6 02/05/2015   HCT 39.6 02/05/2015   MCV 96.6 02/05/2015   PLT 171 02/05/2015      Chemistry      Component Value Date/Time   NA 140 01/08/2015 0912   NA 139 09/01/2011 1835   K 4.6 01/08/2015 0912   K 4.4 09/01/2011 1835   CL 104 09/01/2011 1835   CO2 26 01/08/2015 0912   CO2 25 11/21/2010 0428   BUN 13.0  01/08/2015 0912   BUN 26* 09/01/2011 1835   CREATININE 0.9 01/08/2015 0912   CREATININE 0.90 09/01/2011 1835      Component Value Date/Time   CALCIUM 8.9 01/08/2015 0912   CALCIUM 8.7 11/21/2010 0428   ALKPHOS 53 01/08/2015 0912   ALKPHOS 46 02/26/2010 0440   AST 25 01/08/2015 0912   AST 23 02/26/2010 0440   ALT 12 01/08/2015 0912   ALT 20 02/26/2010 0440   BILITOT 0.57 01/08/2015 0912   BILITOT 0.8 02/26/2010 0440       RADIOGRAPHIC STUDIES: Ct  Biopsy  01/30/2015   CLINICAL DATA:  Neutropenia  EXAM: CT-GUIDED BIOPSY BONE MARROW ASPIRATE AND CORE.  MEDICATIONS AND MEDICAL HISTORY: Versed Three mg, Fentanyl 100 mcg.  Additional Medications: None.  ANESTHESIA/SEDATION: Moderate sedation time: 10 minutes  PROCEDURE: The procedure, risks, benefits, and alternatives were explained to the patient. Questions regarding the procedure were encouraged and answered. The patient understands and consents to the procedure.  The back was prepped with Betadine in a sterile fashion, and a sterile drape was applied covering the operative field. A sterile gown and sterile gloves were used for the procedure.  Under CT guidance, an 11 gauge needle was inserted into the right iliac bone. Aspirates and a core were obtained. Final imaging was performed.  Patient tolerated the procedure well without complication. Vital sign monitoring by nursing staff during the procedure will continue as patient is in the special procedures unit for post procedure observation.  FINDINGS: The images document guide needle placement within the right iliac bone via posterior approach. Post biopsy images demonstrate no hemorrhage.  COMPLICATIONS: None  IMPRESSION: Successful CT-guided right iliac bone marrow aspirate and core.   Electronically Signed   By: Marybelle Killings M.D.   On: 01/30/2015 16:47   Patient: Tiffany Fox, Tiffany Fox Collected: 01/30/2015 Client: [facility] Accession: [id number] Received: 01/30/2015 Art Hoss DOB: 1934/07/21 Age: 50 Gender: F Reported: 02/01/2015 [address] Patient Ph: [phone] MRN #: [id number] [place], [place] [address] Visit #: [id number] Chart #: Phone: [phone] Fax: CC: Curt Bears, MD BONE MARROW REPORT FINAL DIAGNOSIS Diagnosis Bone Marrow, Aspirate,Biopsy, and Clot, right iliac - SLIGHTLY HYPERCELLULAR BONE MARROW FOR AGE WITH TRILINEAGE HEMATOPOIESIS. - MILD LYMPHOCYTOSIS AND PLASMACYTOSIS. - SEE COMMENT. PERIPHERAL BLOOD: -  LEUKOPENIA. Diagnosis Note The bone marrow is slightly hypercellular for age with trilineage hematopoiesis and essentially orderly and progressive maturation of all myeloid cell types. This is associated with slight increase in small lymphocytes and plasma cells but with lack of large aggregates or sheets. Flow cytometric analysis failed to show any monoclonal B cell population or abnormal T cell phenotype. In addition, immunoperoxidase stains show a polyclonal staining pattern for kappa and lambda light chains in the plasma cell component. As a result, there is no definitive evidence of a lymphoproliferative process or plasma cell dyscrasia and hence the findings are likely reactive in nature. Correlation with cytogenetic studies is recommended. (BNS:ecj/ds 01/31/2015) Susanne Greenhouse MD Pathologist, Electronic Signature (Case signed 04/29/  ASSESSMENT AND PLAN: This is a very pleasant 81 years old white female with persistent leukocytopenia and neutropenia of unknown etiology. The only positive finding so far is the positive ANA but I'm not sure if this would explain her leukocytopenia. His recent bone marrow biopsy and aspirate showed no significant abnormalities concerning for lymphoproliferative process or plasma cell dyscrasia. The features are nonspecific and likely reactive in nature. I discussed the lab and biopsy result with the patient today. I recommended for  her to continue on observation for now. She was referred to Dr. Gavin Pound for rheumatology evaluation. I would see the patient back for follow-up visit in 3 months for reevaluation after repeating CBC and LDH. She was advised to call immediately if she has any concerning symptoms in the interval. The patient voices understanding of current disease status and treatment options and is in agreement with the current care plan.  All questions were answered. The patient knows to call the clinic with any problems, questions or concerns.  We can certainly see the patient much sooner if necessary.  Disclaimer: This note was dictated with voice recognition software. Similar sounding words can inadvertently be transcribed and may not be corrected upon review.

## 2015-02-06 LAB — CHROMOSOME ANALYSIS, BONE MARROW

## 2015-02-20 DIAGNOSIS — K648 Other hemorrhoids: Secondary | ICD-10-CM | POA: Diagnosis not present

## 2015-02-20 DIAGNOSIS — K644 Residual hemorrhoidal skin tags: Secondary | ICD-10-CM | POA: Diagnosis not present

## 2015-02-27 DIAGNOSIS — M064 Inflammatory polyarthropathy: Secondary | ICD-10-CM | POA: Diagnosis not present

## 2015-02-27 DIAGNOSIS — M3500 Sicca syndrome, unspecified: Secondary | ICD-10-CM | POA: Diagnosis not present

## 2015-02-27 DIAGNOSIS — M79643 Pain in unspecified hand: Secondary | ICD-10-CM | POA: Diagnosis not present

## 2015-02-27 DIAGNOSIS — M19042 Primary osteoarthritis, left hand: Secondary | ICD-10-CM | POA: Diagnosis not present

## 2015-02-27 DIAGNOSIS — M19041 Primary osteoarthritis, right hand: Secondary | ICD-10-CM | POA: Diagnosis not present

## 2015-02-27 DIAGNOSIS — R768 Other specified abnormal immunological findings in serum: Secondary | ICD-10-CM | POA: Diagnosis not present

## 2015-02-27 DIAGNOSIS — D72819 Decreased white blood cell count, unspecified: Secondary | ICD-10-CM | POA: Diagnosis not present

## 2015-03-06 DIAGNOSIS — E039 Hypothyroidism, unspecified: Secondary | ICD-10-CM | POA: Diagnosis not present

## 2015-03-06 DIAGNOSIS — G479 Sleep disorder, unspecified: Secondary | ICD-10-CM | POA: Diagnosis not present

## 2015-03-06 DIAGNOSIS — Z0000 Encounter for general adult medical examination without abnormal findings: Secondary | ICD-10-CM | POA: Diagnosis not present

## 2015-05-01 DIAGNOSIS — D485 Neoplasm of uncertain behavior of skin: Secondary | ICD-10-CM | POA: Diagnosis not present

## 2015-05-01 DIAGNOSIS — L821 Other seborrheic keratosis: Secondary | ICD-10-CM | POA: Diagnosis not present

## 2015-05-01 DIAGNOSIS — L309 Dermatitis, unspecified: Secondary | ICD-10-CM | POA: Diagnosis not present

## 2015-05-01 DIAGNOSIS — L570 Actinic keratosis: Secondary | ICD-10-CM | POA: Diagnosis not present

## 2015-05-01 DIAGNOSIS — Z85828 Personal history of other malignant neoplasm of skin: Secondary | ICD-10-CM | POA: Diagnosis not present

## 2015-05-01 DIAGNOSIS — L814 Other melanin hyperpigmentation: Secondary | ICD-10-CM | POA: Diagnosis not present

## 2015-05-01 DIAGNOSIS — D1801 Hemangioma of skin and subcutaneous tissue: Secondary | ICD-10-CM | POA: Diagnosis not present

## 2015-05-08 ENCOUNTER — Other Ambulatory Visit (HOSPITAL_BASED_OUTPATIENT_CLINIC_OR_DEPARTMENT_OTHER): Payer: Medicare Other

## 2015-05-08 ENCOUNTER — Ambulatory Visit (HOSPITAL_BASED_OUTPATIENT_CLINIC_OR_DEPARTMENT_OTHER): Payer: Medicare Other | Admitting: Internal Medicine

## 2015-05-08 ENCOUNTER — Encounter: Payer: Self-pay | Admitting: Internal Medicine

## 2015-05-08 ENCOUNTER — Telehealth: Payer: Self-pay | Admitting: Internal Medicine

## 2015-05-08 VITALS — BP 144/61 | HR 76 | Temp 98.4°F | Resp 17 | Ht 63.5 in | Wt 131.8 lb

## 2015-05-08 DIAGNOSIS — D72819 Decreased white blood cell count, unspecified: Secondary | ICD-10-CM | POA: Diagnosis not present

## 2015-05-08 DIAGNOSIS — D709 Neutropenia, unspecified: Secondary | ICD-10-CM

## 2015-05-08 DIAGNOSIS — H04123 Dry eye syndrome of bilateral lacrimal glands: Secondary | ICD-10-CM | POA: Diagnosis not present

## 2015-05-08 LAB — CBC WITH DIFFERENTIAL/PLATELET
BASO%: 1 % (ref 0.0–2.0)
Basophils Absolute: 0 10*3/uL (ref 0.0–0.1)
EOS%: 1.9 % (ref 0.0–7.0)
Eosinophils Absolute: 0.1 10*3/uL (ref 0.0–0.5)
HCT: 39.6 % (ref 34.8–46.6)
HEMOGLOBIN: 13.2 g/dL (ref 11.6–15.9)
LYMPH%: 36.5 % (ref 14.0–49.7)
MCH: 32.7 pg (ref 25.1–34.0)
MCHC: 33.5 g/dL (ref 31.5–36.0)
MCV: 97.7 fL (ref 79.5–101.0)
MONO#: 0.3 10*3/uL (ref 0.1–0.9)
MONO%: 11.8 % (ref 0.0–14.0)
NEUT#: 1.3 10*3/uL — ABNORMAL LOW (ref 1.5–6.5)
NEUT%: 48.8 % (ref 38.4–76.8)
Platelets: 189 10*3/uL (ref 145–400)
RBC: 4.05 10*6/uL (ref 3.70–5.45)
RDW: 14.3 % (ref 11.2–14.5)
WBC: 2.7 10*3/uL — ABNORMAL LOW (ref 3.9–10.3)
lymph#: 1 10*3/uL (ref 0.9–3.3)

## 2015-05-08 LAB — LACTATE DEHYDROGENASE (CC13): LDH: 181 U/L (ref 125–245)

## 2015-05-08 NOTE — Addendum Note (Signed)
Addended by: Ardeen Garland on: 05/08/2015 01:53 PM   Modules accepted: Medications

## 2015-05-08 NOTE — Telephone Encounter (Signed)
Gave and printed appt sched and avs fo rpt for Feb 2017 °

## 2015-05-08 NOTE — Progress Notes (Signed)
[facility] Telephone:(336) [phone]   Fax:(336) [phone]  OFFICE PROGRESS NOTE  Gavin Pound, MD [place] [address]  DIAGNOSIS: Persistent leukocytopenia and neutropenia  PRIOR THERAPY: None  CURRENT THERAPY: None  INTERVAL HISTORY: PRISHA HILEY 82 y.o. female returns to the clinic today for follow-up visit. The patient has been observation for the last few months with no significant issues. The patient is feeling fine today with no specific complaints. She denied having any significant weight loss or night sweats. The patient denied having any significant chest pain, shortness breath, cough or hemoptysis.  She has repeat CBC performed earlier today and she is here for evaluation and discussion of her lab results.  MEDICAL HISTORY: Past Medical History  Diagnosis Date  . Hypertension   . Leukocytopenia     ALLERGIES:  is allergic to lisinopril.  MEDICATIONS:  Current Outpatient Prescriptions  Medication Sig Dispense Refill  . cholecalciferol (VITAMIN D) 1000 UNITS tablet Take 2,000 Units by mouth daily.     . Multiple Vitamins-Minerals (HAIR/SKIN/NAILS PO) Take 1 tablet by mouth daily.    Marland Kitchen omega-3 acid ethyl esters (LOVAZA) 1 G capsule Take 1 g by mouth 2 (two) times daily.      Marland Kitchen OVER THE COUNTER MEDICATION Take 1 tablet by mouth 2 (two) times daily.    . Probiotic Product (PROBIOTIC DAILY PO) Take 1 each by mouth.    . thyroid (ARMOUR) 15 MG tablet Take 75 mg by mouth daily.    Marland Kitchen thyroid (ARMOUR) 60 MG tablet Take 75 mg by mouth daily. 60mg  pill + 15mg  pill total of 75 mg daily    . traZODone (DESYREL) 100 MG tablet Take 100 mg by mouth at bedtime. For sleep      No current facility-administered medications for this visit.    SURGICAL HISTORY:  Past Surgical History  Procedure Laterality Date  . Hip fractuer Left 05.2011    REVIEW OF SYSTEMS:  A comprehensive review of systems was negative.   PHYSICAL EXAMINATION:  General appearance: alert, cooperative and no distress Head: Normocephalic, without obvious abnormality, atraumatic Neck: no adenopathy, no JVD, supple, symmetrical, trachea midline and thyroid not enlarged, symmetric, no tenderness/mass/nodules Lymph nodes: Cervical, supraclavicular, and axillary nodes normal. Resp: clear to auscultation bilaterally Back: symmetric, no curvature. ROM normal. No CVA tenderness. Cardio: regular rate and rhythm, S1, S2 normal, no murmur, click, rub or gallop GI: soft, non-tender; bowel sounds normal; no masses,  no organomegaly Extremities: extremities normal, atraumatic, no cyanosis or edema  ECOG PERFORMANCE STATUS: 0 - Asymptomatic  There were no vitals taken for this visit.  LABORATORY DATA: Lab Results  Component Value Date   WBC 2.7* 05/08/2015   HGB 13.2 05/08/2015   HCT 39.6 05/08/2015   MCV 97.7 05/08/2015   PLT 189 05/08/2015      Chemistry      Component Value Date/Time   NA 139 02/05/2015 1015   NA 139 09/01/2011 1835   K 4.5 02/05/2015 1015   K 4.4 09/01/2011 1835   CL 104 09/01/2011 1835   CO2 27 02/05/2015 1015   CO2 25 11/21/2010 0428   BUN 10.3 02/05/2015 1015   BUN 26* 09/01/2011 1835   CREATININE 0.8 02/05/2015 1015   CREATININE 0.90 09/01/2011 1835      Component Value Date/Time   CALCIUM 9.3 02/05/2015 1015   CALCIUM 8.7 11/21/2010 0428   ALKPHOS 59 02/05/2015 1015   ALKPHOS 46 02/26/2010  0440   AST 23 02/05/2015 1015   AST 23 02/26/2010 0440   ALT 13 02/05/2015 1015   ALT 20 02/26/2010 0440   BILITOT 0.56 02/05/2015 1015   BILITOT 0.8 02/26/2010 0440       RADIOGRAPHIC STUDIES:  ASSESSMENT AND PLAN: This is a very pleasant 81 years old white female with persistent leukocytopenia and neutropenia of unknown etiology.  Her recent CBC showed stable total white blood count but improved absolute neutrophil count. I discussed the lab result with the patient today. I recommended for her to continue on  observation. I would see the patient back for follow-up visit in 6 months for reevaluation after repeating CBC and LDH. She was advised to call immediately if she has any concerning symptoms in the interval. The patient voices understanding of current disease status and treatment options and is in agreement with the current care plan.  All questions were answered. The patient knows to call the clinic with any problems, questions or concerns. We can certainly see the patient much sooner if necessary.  Disclaimer: This note was dictated with voice recognition software. Similar sounding words can inadvertently be transcribed and may not be corrected upon review.

## 2015-05-14 DIAGNOSIS — Z1231 Encounter for screening mammogram for malignant neoplasm of breast: Secondary | ICD-10-CM | POA: Diagnosis not present

## 2015-09-10 DIAGNOSIS — L570 Actinic keratosis: Secondary | ICD-10-CM | POA: Diagnosis not present

## 2015-09-10 DIAGNOSIS — Z85828 Personal history of other malignant neoplasm of skin: Secondary | ICD-10-CM | POA: Diagnosis not present

## 2015-09-10 DIAGNOSIS — L821 Other seborrheic keratosis: Secondary | ICD-10-CM | POA: Diagnosis not present

## 2015-10-23 DIAGNOSIS — L309 Dermatitis, unspecified: Secondary | ICD-10-CM | POA: Diagnosis not present

## 2015-10-23 DIAGNOSIS — Z85828 Personal history of other malignant neoplasm of skin: Secondary | ICD-10-CM | POA: Diagnosis not present

## 2015-11-11 DIAGNOSIS — L2089 Other atopic dermatitis: Secondary | ICD-10-CM | POA: Diagnosis not present

## 2015-11-11 DIAGNOSIS — Z85828 Personal history of other malignant neoplasm of skin: Secondary | ICD-10-CM | POA: Diagnosis not present

## 2015-11-13 ENCOUNTER — Telehealth: Payer: Self-pay | Admitting: Internal Medicine

## 2015-11-13 ENCOUNTER — Other Ambulatory Visit (HOSPITAL_BASED_OUTPATIENT_CLINIC_OR_DEPARTMENT_OTHER): Payer: Medicare Other

## 2015-11-13 ENCOUNTER — Encounter: Payer: Self-pay | Admitting: Internal Medicine

## 2015-11-13 ENCOUNTER — Ambulatory Visit (HOSPITAL_BASED_OUTPATIENT_CLINIC_OR_DEPARTMENT_OTHER): Payer: Medicare Other | Admitting: Internal Medicine

## 2015-11-13 VITALS — BP 158/78 | HR 78 | Temp 98.2°F | Resp 18 | Ht 63.5 in | Wt 126.6 lb

## 2015-11-13 DIAGNOSIS — D72819 Decreased white blood cell count, unspecified: Secondary | ICD-10-CM | POA: Diagnosis not present

## 2015-11-13 DIAGNOSIS — D709 Neutropenia, unspecified: Secondary | ICD-10-CM

## 2015-11-13 LAB — COMPREHENSIVE METABOLIC PANEL
ALBUMIN: 3.8 g/dL (ref 3.5–5.0)
ALT: 11 U/L (ref 0–55)
AST: 21 U/L (ref 5–34)
Alkaline Phosphatase: 50 U/L (ref 40–150)
Anion Gap: 9 mEq/L (ref 3–11)
BUN: 17.3 mg/dL (ref 7.0–26.0)
CO2: 26 mEq/L (ref 22–29)
Calcium: 9.8 mg/dL (ref 8.4–10.4)
Chloride: 103 mEq/L (ref 98–109)
Creatinine: 0.9 mg/dL (ref 0.6–1.1)
EGFR: 64 mL/min/{1.73_m2} — ABNORMAL LOW (ref >90)
GLUCOSE: 80 mg/dL (ref 70–140)
POTASSIUM: 4.2 meq/L (ref 3.5–5.1)
SODIUM: 138 meq/L (ref 136–145)
Total Bilirubin: 0.59 mg/dL (ref 0.20–1.20)
Total Protein: 6.9 g/dL (ref 6.4–8.3)

## 2015-11-13 LAB — CBC WITH DIFFERENTIAL/PLATELET
BASO%: 0.5 % (ref 0.0–2.0)
Basophils Absolute: 0 10*3/uL (ref 0.0–0.1)
EOS%: 0.7 % (ref 0.0–7.0)
Eosinophils Absolute: 0 10*3/uL (ref 0.0–0.5)
HEMATOCRIT: 41.5 % (ref 34.8–46.6)
HEMOGLOBIN: 13.5 g/dL (ref 11.6–15.9)
LYMPH#: 0.8 10*3/uL — AB (ref 0.9–3.3)
LYMPH%: 19.7 % (ref 14.0–49.7)
MCH: 31.7 pg (ref 25.1–34.0)
MCHC: 32.5 g/dL (ref 31.5–36.0)
MCV: 97.6 fL (ref 79.5–101.0)
MONO#: 0.3 10*3/uL (ref 0.1–0.9)
MONO%: 8.6 % (ref 0.0–14.0)
NEUT#: 2.7 10*3/uL (ref 1.5–6.5)
NEUT%: 70.5 % (ref 38.4–76.8)
Platelets: 182 10*3/uL (ref 145–400)
RBC: 4.25 10*6/uL (ref 3.70–5.45)
RDW: 14 % (ref 11.2–14.5)
WBC: 3.9 10*3/uL (ref 3.9–10.3)

## 2015-11-13 LAB — LACTATE DEHYDROGENASE: LDH: 190 U/L (ref 125–245)

## 2015-11-13 NOTE — Progress Notes (Signed)
[facility] Telephone:([phone]   Fax:(336) [phone]  OFFICE PROGRESS NOTE  Gavin Pound, MD [place] [address]  DIAGNOSIS: Persistent leukocytopenia and neutropenia  PRIOR THERAPY: None  CURRENT THERAPY: None  INTERVAL HISTORY: Tiffany Fox 82 y.o. female returns to the clinic today for follow-up visit. The patient is feeling fine today with no specific complaints but she is under a lot of stress after her son was diagnosed with head and neck cancer. She is also currently on treatment with short course of prednisone for dermatitis. She denied having any significant weight loss or night sweats. The patient denied having any significant chest pain, shortness breath, cough or hemoptysis. She has repeat CBC performed earlier today and she is here for evaluation and discussion of her lab results.  MEDICAL HISTORY: Past Medical History  Diagnosis Date  . Hypertension   . Leukocytopenia     ALLERGIES:  is allergic to lisinopril.  MEDICATIONS:  Current Outpatient Prescriptions  Medication Sig Dispense Refill  . cholecalciferol (VITAMIN D) 1000 UNITS tablet Take 2,000 Units by mouth daily.     . Multiple Vitamins-Minerals (HAIR/SKIN/NAILS PO) Take 1 tablet by mouth daily.    Marland Kitchen omega-3 acid ethyl esters (LOVAZA) 1 G capsule Take 1 g by mouth 2 (two) times daily.      Marland Kitchen OVER THE COUNTER MEDICATION Take 1 tablet by mouth 2 (two) times daily.    . predniSONE (DELTASONE) 10 MG tablet Take 10 mg by mouth.    . Probiotic Product (PROBIOTIC DAILY PO) Take 1 each by mouth.    . thyroid (ARMOUR) 15 MG tablet Take 75 mg by mouth daily.    Marland Kitchen thyroid (ARMOUR) 60 MG tablet Take 75 mg by mouth daily. 60mg  pill + 15mg  pill total of 75 mg daily    . traZODone (DESYREL) 100 MG tablet Take 100 mg by mouth at bedtime. For sleep     . triamcinolone ointment (KENALOG) 0.1 %      No current facility-administered medications for this visit.    SURGICAL  HISTORY:  Past Surgical History  Procedure Laterality Date  . Hip fractuer Left 05.2011    REVIEW OF SYSTEMS:  A comprehensive review of systems was negative.   PHYSICAL EXAMINATION: General appearance: alert, cooperative and no distress Head: Normocephalic, without obvious abnormality, atraumatic Neck: no adenopathy, no JVD, supple, symmetrical, trachea midline and thyroid not enlarged, symmetric, no tenderness/mass/nodules Lymph nodes: Cervical, supraclavicular, and axillary nodes normal. Resp: clear to auscultation bilaterally Back: symmetric, no curvature. ROM normal. No CVA tenderness. Cardio: regular rate and rhythm, S1, S2 normal, no murmur, click, rub or gallop GI: soft, non-tender; bowel sounds normal; no masses,  no organomegaly Extremities: extremities normal, atraumatic, no cyanosis or edema  ECOG PERFORMANCE STATUS: 0 - Asymptomatic  Blood pressure 158/78, pulse 78, temperature 98.2 F (36.8 C), temperature source Oral, resp. rate 18, height 5' 3.5" (1.613 m), weight 126 lb 9.6 oz (57.425 kg), SpO2 98 %.  LABORATORY DATA: Lab Results  Component Value Date   WBC 3.9 11/13/2015   HGB 13.5 11/13/2015   HCT 41.5 11/13/2015   MCV 97.6 11/13/2015   PLT 182 11/13/2015      Chemistry      Component Value Date/Time   NA 139 02/05/2015 1015   NA 139 09/01/2011 1835   K 4.5 02/05/2015 1015   K 4.4 09/01/2011 1835   CL 104 09/01/2011 1835   CO2 27  02/05/2015 1015   CO2 25 11/21/2010 0428   BUN 10.3 02/05/2015 1015   BUN 26* 09/01/2011 1835   CREATININE 0.8 02/05/2015 1015   CREATININE 0.90 09/01/2011 1835      Component Value Date/Time   CALCIUM 9.3 02/05/2015 1015   CALCIUM 8.7 11/21/2010 0428   ALKPHOS 59 02/05/2015 1015   ALKPHOS 46 02/26/2010 0440   AST 23 02/05/2015 1015   AST 23 02/26/2010 0440   ALT 13 02/05/2015 1015   ALT 20 02/26/2010 0440   BILITOT 0.56 02/05/2015 1015   BILITOT 0.8 02/26/2010 0440       RADIOGRAPHIC STUDIES:  ASSESSMENT  AND PLAN: This is a very pleasant 82 years old white female with persistent leukocytopenia and neutropenia of unknown etiology.  Her recent CBC showed resolution of her leukocytopenia and neutropenia. This could be spontaneous resolution or secondary to her recent treatment with prednisone. I discussed the lab result with the patient today. I recommended for her to continue on observation. I would see the patient back for follow-up visit in 6 months for reevaluation after repeating CBC and LDH. She was advised to call immediately if she has any concerning symptoms in the interval. The patient voices understanding of current disease status and treatment options and is in agreement with the current care plan.  All questions were answered. The patient knows to call the clinic with any problems, questions or concerns. We can certainly see the patient much sooner if necessary.  Disclaimer: This note was dictated with voice recognition software. Similar sounding words can inadvertently be transcribed and may not be corrected upon review.

## 2015-11-13 NOTE — Telephone Encounter (Signed)
Pt confirmed labs/ov per 02/08 POF, gave pt AVS and Calendar.... KJ °

## 2015-12-25 DIAGNOSIS — M461 Sacroiliitis, not elsewhere classified: Secondary | ICD-10-CM | POA: Diagnosis not present

## 2015-12-25 DIAGNOSIS — M25562 Pain in left knee: Secondary | ICD-10-CM | POA: Diagnosis not present

## 2015-12-25 DIAGNOSIS — M25561 Pain in right knee: Secondary | ICD-10-CM | POA: Diagnosis not present

## 2015-12-25 DIAGNOSIS — M5127 Other intervertebral disc displacement, lumbosacral region: Secondary | ICD-10-CM | POA: Diagnosis not present

## 2015-12-25 DIAGNOSIS — M25552 Pain in left hip: Secondary | ICD-10-CM | POA: Diagnosis not present

## 2015-12-25 DIAGNOSIS — M4722 Other spondylosis with radiculopathy, cervical region: Secondary | ICD-10-CM | POA: Diagnosis not present

## 2015-12-25 DIAGNOSIS — M25551 Pain in right hip: Secondary | ICD-10-CM | POA: Diagnosis not present

## 2015-12-25 DIAGNOSIS — M545 Low back pain: Secondary | ICD-10-CM | POA: Diagnosis not present

## 2015-12-25 DIAGNOSIS — M791 Myalgia: Secondary | ICD-10-CM | POA: Diagnosis not present

## 2015-12-25 DIAGNOSIS — M542 Cervicalgia: Secondary | ICD-10-CM | POA: Diagnosis not present

## 2016-01-01 DIAGNOSIS — M5137 Other intervertebral disc degeneration, lumbosacral region: Secondary | ICD-10-CM | POA: Diagnosis not present

## 2016-01-01 DIAGNOSIS — M9901 Segmental and somatic dysfunction of cervical region: Secondary | ICD-10-CM | POA: Diagnosis not present

## 2016-01-01 DIAGNOSIS — M542 Cervicalgia: Secondary | ICD-10-CM | POA: Diagnosis not present

## 2016-01-01 DIAGNOSIS — M4722 Other spondylosis with radiculopathy, cervical region: Secondary | ICD-10-CM | POA: Diagnosis not present

## 2016-01-01 DIAGNOSIS — R293 Abnormal posture: Secondary | ICD-10-CM | POA: Diagnosis not present

## 2016-01-01 DIAGNOSIS — M6240 Contracture of muscle, unspecified site: Secondary | ICD-10-CM | POA: Diagnosis not present

## 2016-01-01 DIAGNOSIS — M5136 Other intervertebral disc degeneration, lumbar region: Secondary | ICD-10-CM | POA: Diagnosis not present

## 2016-01-01 DIAGNOSIS — M791 Myalgia: Secondary | ICD-10-CM | POA: Diagnosis not present

## 2016-01-01 DIAGNOSIS — M9903 Segmental and somatic dysfunction of lumbar region: Secondary | ICD-10-CM | POA: Diagnosis not present

## 2016-01-01 DIAGNOSIS — M461 Sacroiliitis, not elsewhere classified: Secondary | ICD-10-CM | POA: Diagnosis not present

## 2016-01-01 DIAGNOSIS — M5127 Other intervertebral disc displacement, lumbosacral region: Secondary | ICD-10-CM | POA: Diagnosis not present

## 2016-01-06 DIAGNOSIS — M5136 Other intervertebral disc degeneration, lumbar region: Secondary | ICD-10-CM | POA: Diagnosis not present

## 2016-01-06 DIAGNOSIS — M461 Sacroiliitis, not elsewhere classified: Secondary | ICD-10-CM | POA: Diagnosis not present

## 2016-01-06 DIAGNOSIS — M5127 Other intervertebral disc displacement, lumbosacral region: Secondary | ICD-10-CM | POA: Diagnosis not present

## 2016-01-06 DIAGNOSIS — M5137 Other intervertebral disc degeneration, lumbosacral region: Secondary | ICD-10-CM | POA: Diagnosis not present

## 2016-01-06 DIAGNOSIS — M9903 Segmental and somatic dysfunction of lumbar region: Secondary | ICD-10-CM | POA: Diagnosis not present

## 2016-01-06 DIAGNOSIS — M4722 Other spondylosis with radiculopathy, cervical region: Secondary | ICD-10-CM | POA: Diagnosis not present

## 2016-01-06 DIAGNOSIS — M9901 Segmental and somatic dysfunction of cervical region: Secondary | ICD-10-CM | POA: Diagnosis not present

## 2016-01-06 DIAGNOSIS — M791 Myalgia: Secondary | ICD-10-CM | POA: Diagnosis not present

## 2016-01-06 DIAGNOSIS — R293 Abnormal posture: Secondary | ICD-10-CM | POA: Diagnosis not present

## 2016-01-06 DIAGNOSIS — M6240 Contracture of muscle, unspecified site: Secondary | ICD-10-CM | POA: Diagnosis not present

## 2016-01-08 DIAGNOSIS — M5136 Other intervertebral disc degeneration, lumbar region: Secondary | ICD-10-CM | POA: Diagnosis not present

## 2016-01-08 DIAGNOSIS — R293 Abnormal posture: Secondary | ICD-10-CM | POA: Diagnosis not present

## 2016-01-08 DIAGNOSIS — M4722 Other spondylosis with radiculopathy, cervical region: Secondary | ICD-10-CM | POA: Diagnosis not present

## 2016-01-08 DIAGNOSIS — M5127 Other intervertebral disc displacement, lumbosacral region: Secondary | ICD-10-CM | POA: Diagnosis not present

## 2016-01-08 DIAGNOSIS — M5137 Other intervertebral disc degeneration, lumbosacral region: Secondary | ICD-10-CM | POA: Diagnosis not present

## 2016-01-08 DIAGNOSIS — M461 Sacroiliitis, not elsewhere classified: Secondary | ICD-10-CM | POA: Diagnosis not present

## 2016-01-08 DIAGNOSIS — M791 Myalgia: Secondary | ICD-10-CM | POA: Diagnosis not present

## 2016-01-08 DIAGNOSIS — M9901 Segmental and somatic dysfunction of cervical region: Secondary | ICD-10-CM | POA: Diagnosis not present

## 2016-01-08 DIAGNOSIS — M6240 Contracture of muscle, unspecified site: Secondary | ICD-10-CM | POA: Diagnosis not present

## 2016-01-08 DIAGNOSIS — M9903 Segmental and somatic dysfunction of lumbar region: Secondary | ICD-10-CM | POA: Diagnosis not present

## 2016-01-08 DIAGNOSIS — M542 Cervicalgia: Secondary | ICD-10-CM | POA: Diagnosis not present

## 2016-01-13 DIAGNOSIS — M461 Sacroiliitis, not elsewhere classified: Secondary | ICD-10-CM | POA: Diagnosis not present

## 2016-01-13 DIAGNOSIS — M5127 Other intervertebral disc displacement, lumbosacral region: Secondary | ICD-10-CM | POA: Diagnosis not present

## 2016-01-13 DIAGNOSIS — R293 Abnormal posture: Secondary | ICD-10-CM | POA: Diagnosis not present

## 2016-01-13 DIAGNOSIS — M791 Myalgia: Secondary | ICD-10-CM | POA: Diagnosis not present

## 2016-01-13 DIAGNOSIS — M9903 Segmental and somatic dysfunction of lumbar region: Secondary | ICD-10-CM | POA: Diagnosis not present

## 2016-01-13 DIAGNOSIS — M5136 Other intervertebral disc degeneration, lumbar region: Secondary | ICD-10-CM | POA: Diagnosis not present

## 2016-01-13 DIAGNOSIS — M6240 Contracture of muscle, unspecified site: Secondary | ICD-10-CM | POA: Diagnosis not present

## 2016-01-13 DIAGNOSIS — M4722 Other spondylosis with radiculopathy, cervical region: Secondary | ICD-10-CM | POA: Diagnosis not present

## 2016-01-13 DIAGNOSIS — M9901 Segmental and somatic dysfunction of cervical region: Secondary | ICD-10-CM | POA: Diagnosis not present

## 2016-01-13 DIAGNOSIS — M5137 Other intervertebral disc degeneration, lumbosacral region: Secondary | ICD-10-CM | POA: Diagnosis not present

## 2016-01-16 DIAGNOSIS — R293 Abnormal posture: Secondary | ICD-10-CM | POA: Diagnosis not present

## 2016-01-16 DIAGNOSIS — M5127 Other intervertebral disc displacement, lumbosacral region: Secondary | ICD-10-CM | POA: Diagnosis not present

## 2016-01-16 DIAGNOSIS — M461 Sacroiliitis, not elsewhere classified: Secondary | ICD-10-CM | POA: Diagnosis not present

## 2016-01-16 DIAGNOSIS — M791 Myalgia: Secondary | ICD-10-CM | POA: Diagnosis not present

## 2016-01-16 DIAGNOSIS — M9901 Segmental and somatic dysfunction of cervical region: Secondary | ICD-10-CM | POA: Diagnosis not present

## 2016-01-16 DIAGNOSIS — M5137 Other intervertebral disc degeneration, lumbosacral region: Secondary | ICD-10-CM | POA: Diagnosis not present

## 2016-01-16 DIAGNOSIS — M9903 Segmental and somatic dysfunction of lumbar region: Secondary | ICD-10-CM | POA: Diagnosis not present

## 2016-01-16 DIAGNOSIS — M542 Cervicalgia: Secondary | ICD-10-CM | POA: Diagnosis not present

## 2016-01-16 DIAGNOSIS — M5136 Other intervertebral disc degeneration, lumbar region: Secondary | ICD-10-CM | POA: Diagnosis not present

## 2016-01-16 DIAGNOSIS — M6240 Contracture of muscle, unspecified site: Secondary | ICD-10-CM | POA: Diagnosis not present

## 2016-01-16 DIAGNOSIS — M4722 Other spondylosis with radiculopathy, cervical region: Secondary | ICD-10-CM | POA: Diagnosis not present

## 2016-01-20 DIAGNOSIS — M791 Myalgia: Secondary | ICD-10-CM | POA: Diagnosis not present

## 2016-01-20 DIAGNOSIS — M4722 Other spondylosis with radiculopathy, cervical region: Secondary | ICD-10-CM | POA: Diagnosis not present

## 2016-01-20 DIAGNOSIS — M5137 Other intervertebral disc degeneration, lumbosacral region: Secondary | ICD-10-CM | POA: Diagnosis not present

## 2016-01-20 DIAGNOSIS — R293 Abnormal posture: Secondary | ICD-10-CM | POA: Diagnosis not present

## 2016-01-20 DIAGNOSIS — M542 Cervicalgia: Secondary | ICD-10-CM | POA: Diagnosis not present

## 2016-01-20 DIAGNOSIS — M9901 Segmental and somatic dysfunction of cervical region: Secondary | ICD-10-CM | POA: Diagnosis not present

## 2016-01-20 DIAGNOSIS — M461 Sacroiliitis, not elsewhere classified: Secondary | ICD-10-CM | POA: Diagnosis not present

## 2016-01-20 DIAGNOSIS — M9903 Segmental and somatic dysfunction of lumbar region: Secondary | ICD-10-CM | POA: Diagnosis not present

## 2016-01-20 DIAGNOSIS — M5127 Other intervertebral disc displacement, lumbosacral region: Secondary | ICD-10-CM | POA: Diagnosis not present

## 2016-01-20 DIAGNOSIS — M5136 Other intervertebral disc degeneration, lumbar region: Secondary | ICD-10-CM | POA: Diagnosis not present

## 2016-01-20 DIAGNOSIS — M6240 Contracture of muscle, unspecified site: Secondary | ICD-10-CM | POA: Diagnosis not present

## 2016-01-23 DIAGNOSIS — M9903 Segmental and somatic dysfunction of lumbar region: Secondary | ICD-10-CM | POA: Diagnosis not present

## 2016-01-23 DIAGNOSIS — M791 Myalgia: Secondary | ICD-10-CM | POA: Diagnosis not present

## 2016-01-23 DIAGNOSIS — M5127 Other intervertebral disc displacement, lumbosacral region: Secondary | ICD-10-CM | POA: Diagnosis not present

## 2016-01-23 DIAGNOSIS — M5137 Other intervertebral disc degeneration, lumbosacral region: Secondary | ICD-10-CM | POA: Diagnosis not present

## 2016-01-23 DIAGNOSIS — M4722 Other spondylosis with radiculopathy, cervical region: Secondary | ICD-10-CM | POA: Diagnosis not present

## 2016-01-23 DIAGNOSIS — M6240 Contracture of muscle, unspecified site: Secondary | ICD-10-CM | POA: Diagnosis not present

## 2016-01-23 DIAGNOSIS — M5136 Other intervertebral disc degeneration, lumbar region: Secondary | ICD-10-CM | POA: Diagnosis not present

## 2016-01-23 DIAGNOSIS — M461 Sacroiliitis, not elsewhere classified: Secondary | ICD-10-CM | POA: Diagnosis not present

## 2016-01-23 DIAGNOSIS — R293 Abnormal posture: Secondary | ICD-10-CM | POA: Diagnosis not present

## 2016-01-23 DIAGNOSIS — M9901 Segmental and somatic dysfunction of cervical region: Secondary | ICD-10-CM | POA: Diagnosis not present

## 2016-01-27 DIAGNOSIS — M5136 Other intervertebral disc degeneration, lumbar region: Secondary | ICD-10-CM | POA: Diagnosis not present

## 2016-01-27 DIAGNOSIS — M9903 Segmental and somatic dysfunction of lumbar region: Secondary | ICD-10-CM | POA: Diagnosis not present

## 2016-01-27 DIAGNOSIS — M6240 Contracture of muscle, unspecified site: Secondary | ICD-10-CM | POA: Diagnosis not present

## 2016-01-27 DIAGNOSIS — M9901 Segmental and somatic dysfunction of cervical region: Secondary | ICD-10-CM | POA: Diagnosis not present

## 2016-01-27 DIAGNOSIS — M542 Cervicalgia: Secondary | ICD-10-CM | POA: Diagnosis not present

## 2016-01-27 DIAGNOSIS — R293 Abnormal posture: Secondary | ICD-10-CM | POA: Diagnosis not present

## 2016-01-27 DIAGNOSIS — M5137 Other intervertebral disc degeneration, lumbosacral region: Secondary | ICD-10-CM | POA: Diagnosis not present

## 2016-01-27 DIAGNOSIS — M4722 Other spondylosis with radiculopathy, cervical region: Secondary | ICD-10-CM | POA: Diagnosis not present

## 2016-01-27 DIAGNOSIS — M461 Sacroiliitis, not elsewhere classified: Secondary | ICD-10-CM | POA: Diagnosis not present

## 2016-01-27 DIAGNOSIS — M791 Myalgia: Secondary | ICD-10-CM | POA: Diagnosis not present

## 2016-01-27 DIAGNOSIS — M5127 Other intervertebral disc displacement, lumbosacral region: Secondary | ICD-10-CM | POA: Diagnosis not present

## 2016-01-30 DIAGNOSIS — R293 Abnormal posture: Secondary | ICD-10-CM | POA: Diagnosis not present

## 2016-01-30 DIAGNOSIS — M9903 Segmental and somatic dysfunction of lumbar region: Secondary | ICD-10-CM | POA: Diagnosis not present

## 2016-01-30 DIAGNOSIS — M791 Myalgia: Secondary | ICD-10-CM | POA: Diagnosis not present

## 2016-01-30 DIAGNOSIS — M4722 Other spondylosis with radiculopathy, cervical region: Secondary | ICD-10-CM | POA: Diagnosis not present

## 2016-01-30 DIAGNOSIS — M9901 Segmental and somatic dysfunction of cervical region: Secondary | ICD-10-CM | POA: Diagnosis not present

## 2016-01-30 DIAGNOSIS — M542 Cervicalgia: Secondary | ICD-10-CM | POA: Diagnosis not present

## 2016-01-30 DIAGNOSIS — M461 Sacroiliitis, not elsewhere classified: Secondary | ICD-10-CM | POA: Diagnosis not present

## 2016-01-30 DIAGNOSIS — M5137 Other intervertebral disc degeneration, lumbosacral region: Secondary | ICD-10-CM | POA: Diagnosis not present

## 2016-01-30 DIAGNOSIS — M5127 Other intervertebral disc displacement, lumbosacral region: Secondary | ICD-10-CM | POA: Diagnosis not present

## 2016-01-30 DIAGNOSIS — M6240 Contracture of muscle, unspecified site: Secondary | ICD-10-CM | POA: Diagnosis not present

## 2016-01-30 DIAGNOSIS — M5136 Other intervertebral disc degeneration, lumbar region: Secondary | ICD-10-CM | POA: Diagnosis not present

## 2016-02-03 DIAGNOSIS — M791 Myalgia: Secondary | ICD-10-CM | POA: Diagnosis not present

## 2016-02-03 DIAGNOSIS — M542 Cervicalgia: Secondary | ICD-10-CM | POA: Diagnosis not present

## 2016-02-03 DIAGNOSIS — M5136 Other intervertebral disc degeneration, lumbar region: Secondary | ICD-10-CM | POA: Diagnosis not present

## 2016-02-03 DIAGNOSIS — M6240 Contracture of muscle, unspecified site: Secondary | ICD-10-CM | POA: Diagnosis not present

## 2016-02-03 DIAGNOSIS — M9903 Segmental and somatic dysfunction of lumbar region: Secondary | ICD-10-CM | POA: Diagnosis not present

## 2016-02-03 DIAGNOSIS — M461 Sacroiliitis, not elsewhere classified: Secondary | ICD-10-CM | POA: Diagnosis not present

## 2016-02-03 DIAGNOSIS — M5137 Other intervertebral disc degeneration, lumbosacral region: Secondary | ICD-10-CM | POA: Diagnosis not present

## 2016-02-03 DIAGNOSIS — M9901 Segmental and somatic dysfunction of cervical region: Secondary | ICD-10-CM | POA: Diagnosis not present

## 2016-02-03 DIAGNOSIS — M4722 Other spondylosis with radiculopathy, cervical region: Secondary | ICD-10-CM | POA: Diagnosis not present

## 2016-02-03 DIAGNOSIS — M5127 Other intervertebral disc displacement, lumbosacral region: Secondary | ICD-10-CM | POA: Diagnosis not present

## 2016-02-03 DIAGNOSIS — R293 Abnormal posture: Secondary | ICD-10-CM | POA: Diagnosis not present

## 2016-02-06 DIAGNOSIS — M9901 Segmental and somatic dysfunction of cervical region: Secondary | ICD-10-CM | POA: Diagnosis not present

## 2016-02-06 DIAGNOSIS — M542 Cervicalgia: Secondary | ICD-10-CM | POA: Diagnosis not present

## 2016-02-06 DIAGNOSIS — M4722 Other spondylosis with radiculopathy, cervical region: Secondary | ICD-10-CM | POA: Diagnosis not present

## 2016-02-06 DIAGNOSIS — M461 Sacroiliitis, not elsewhere classified: Secondary | ICD-10-CM | POA: Diagnosis not present

## 2016-02-06 DIAGNOSIS — R293 Abnormal posture: Secondary | ICD-10-CM | POA: Diagnosis not present

## 2016-02-06 DIAGNOSIS — M5136 Other intervertebral disc degeneration, lumbar region: Secondary | ICD-10-CM | POA: Diagnosis not present

## 2016-02-06 DIAGNOSIS — M9903 Segmental and somatic dysfunction of lumbar region: Secondary | ICD-10-CM | POA: Diagnosis not present

## 2016-02-06 DIAGNOSIS — M6240 Contracture of muscle, unspecified site: Secondary | ICD-10-CM | POA: Diagnosis not present

## 2016-02-06 DIAGNOSIS — M791 Myalgia: Secondary | ICD-10-CM | POA: Diagnosis not present

## 2016-02-06 DIAGNOSIS — M5137 Other intervertebral disc degeneration, lumbosacral region: Secondary | ICD-10-CM | POA: Diagnosis not present

## 2016-02-06 DIAGNOSIS — M5127 Other intervertebral disc displacement, lumbosacral region: Secondary | ICD-10-CM | POA: Diagnosis not present

## 2016-02-10 DIAGNOSIS — M4722 Other spondylosis with radiculopathy, cervical region: Secondary | ICD-10-CM | POA: Diagnosis not present

## 2016-02-10 DIAGNOSIS — R202 Paresthesia of skin: Secondary | ICD-10-CM | POA: Diagnosis not present

## 2016-02-10 DIAGNOSIS — M9901 Segmental and somatic dysfunction of cervical region: Secondary | ICD-10-CM | POA: Diagnosis not present

## 2016-02-10 DIAGNOSIS — M9903 Segmental and somatic dysfunction of lumbar region: Secondary | ICD-10-CM | POA: Diagnosis not present

## 2016-02-10 DIAGNOSIS — M5137 Other intervertebral disc degeneration, lumbosacral region: Secondary | ICD-10-CM | POA: Diagnosis not present

## 2016-02-10 DIAGNOSIS — M6240 Contracture of muscle, unspecified site: Secondary | ICD-10-CM | POA: Diagnosis not present

## 2016-02-10 DIAGNOSIS — M5136 Other intervertebral disc degeneration, lumbar region: Secondary | ICD-10-CM | POA: Diagnosis not present

## 2016-02-10 DIAGNOSIS — R293 Abnormal posture: Secondary | ICD-10-CM | POA: Diagnosis not present

## 2016-02-13 DIAGNOSIS — M5127 Other intervertebral disc displacement, lumbosacral region: Secondary | ICD-10-CM | POA: Diagnosis not present

## 2016-02-13 DIAGNOSIS — R293 Abnormal posture: Secondary | ICD-10-CM | POA: Diagnosis not present

## 2016-02-13 DIAGNOSIS — M4722 Other spondylosis with radiculopathy, cervical region: Secondary | ICD-10-CM | POA: Diagnosis not present

## 2016-02-13 DIAGNOSIS — M5136 Other intervertebral disc degeneration, lumbar region: Secondary | ICD-10-CM | POA: Diagnosis not present

## 2016-02-13 DIAGNOSIS — M5137 Other intervertebral disc degeneration, lumbosacral region: Secondary | ICD-10-CM | POA: Diagnosis not present

## 2016-02-13 DIAGNOSIS — M9903 Segmental and somatic dysfunction of lumbar region: Secondary | ICD-10-CM | POA: Diagnosis not present

## 2016-02-13 DIAGNOSIS — M9901 Segmental and somatic dysfunction of cervical region: Secondary | ICD-10-CM | POA: Diagnosis not present

## 2016-02-13 DIAGNOSIS — M461 Sacroiliitis, not elsewhere classified: Secondary | ICD-10-CM | POA: Diagnosis not present

## 2016-02-13 DIAGNOSIS — M6240 Contracture of muscle, unspecified site: Secondary | ICD-10-CM | POA: Diagnosis not present

## 2016-02-13 DIAGNOSIS — M791 Myalgia: Secondary | ICD-10-CM | POA: Diagnosis not present

## 2016-02-17 DIAGNOSIS — M6240 Contracture of muscle, unspecified site: Secondary | ICD-10-CM | POA: Diagnosis not present

## 2016-02-17 DIAGNOSIS — M50222 Other cervical disc displacement at C5-C6 level: Secondary | ICD-10-CM | POA: Diagnosis not present

## 2016-02-17 DIAGNOSIS — M9903 Segmental and somatic dysfunction of lumbar region: Secondary | ICD-10-CM | POA: Diagnosis not present

## 2016-02-17 DIAGNOSIS — M791 Myalgia: Secondary | ICD-10-CM | POA: Diagnosis not present

## 2016-02-17 DIAGNOSIS — M5136 Other intervertebral disc degeneration, lumbar region: Secondary | ICD-10-CM | POA: Diagnosis not present

## 2016-02-17 DIAGNOSIS — M5137 Other intervertebral disc degeneration, lumbosacral region: Secondary | ICD-10-CM | POA: Diagnosis not present

## 2016-02-17 DIAGNOSIS — R293 Abnormal posture: Secondary | ICD-10-CM | POA: Diagnosis not present

## 2016-02-17 DIAGNOSIS — M4722 Other spondylosis with radiculopathy, cervical region: Secondary | ICD-10-CM | POA: Diagnosis not present

## 2016-02-17 DIAGNOSIS — M9901 Segmental and somatic dysfunction of cervical region: Secondary | ICD-10-CM | POA: Diagnosis not present

## 2016-02-17 DIAGNOSIS — M545 Low back pain: Secondary | ICD-10-CM | POA: Diagnosis not present

## 2016-02-17 DIAGNOSIS — M461 Sacroiliitis, not elsewhere classified: Secondary | ICD-10-CM | POA: Diagnosis not present

## 2016-02-18 DIAGNOSIS — K648 Other hemorrhoids: Secondary | ICD-10-CM | POA: Diagnosis not present

## 2016-02-18 DIAGNOSIS — K644 Residual hemorrhoidal skin tags: Secondary | ICD-10-CM | POA: Diagnosis not present

## 2016-02-20 DIAGNOSIS — R293 Abnormal posture: Secondary | ICD-10-CM | POA: Diagnosis not present

## 2016-02-20 DIAGNOSIS — M461 Sacroiliitis, not elsewhere classified: Secondary | ICD-10-CM | POA: Diagnosis not present

## 2016-02-20 DIAGNOSIS — M6240 Contracture of muscle, unspecified site: Secondary | ICD-10-CM | POA: Diagnosis not present

## 2016-02-20 DIAGNOSIS — M545 Low back pain: Secondary | ICD-10-CM | POA: Diagnosis not present

## 2016-02-20 DIAGNOSIS — M5137 Other intervertebral disc degeneration, lumbosacral region: Secondary | ICD-10-CM | POA: Diagnosis not present

## 2016-02-20 DIAGNOSIS — M9901 Segmental and somatic dysfunction of cervical region: Secondary | ICD-10-CM | POA: Diagnosis not present

## 2016-02-20 DIAGNOSIS — M9903 Segmental and somatic dysfunction of lumbar region: Secondary | ICD-10-CM | POA: Diagnosis not present

## 2016-02-20 DIAGNOSIS — M50222 Other cervical disc displacement at C5-C6 level: Secondary | ICD-10-CM | POA: Diagnosis not present

## 2016-02-20 DIAGNOSIS — M791 Myalgia: Secondary | ICD-10-CM | POA: Diagnosis not present

## 2016-02-20 DIAGNOSIS — M4722 Other spondylosis with radiculopathy, cervical region: Secondary | ICD-10-CM | POA: Diagnosis not present

## 2016-02-20 DIAGNOSIS — M5136 Other intervertebral disc degeneration, lumbar region: Secondary | ICD-10-CM | POA: Diagnosis not present

## 2016-02-24 DIAGNOSIS — M6240 Contracture of muscle, unspecified site: Secondary | ICD-10-CM | POA: Diagnosis not present

## 2016-02-24 DIAGNOSIS — M791 Myalgia: Secondary | ICD-10-CM | POA: Diagnosis not present

## 2016-02-24 DIAGNOSIS — M461 Sacroiliitis, not elsewhere classified: Secondary | ICD-10-CM | POA: Diagnosis not present

## 2016-02-24 DIAGNOSIS — R293 Abnormal posture: Secondary | ICD-10-CM | POA: Diagnosis not present

## 2016-02-24 DIAGNOSIS — M9903 Segmental and somatic dysfunction of lumbar region: Secondary | ICD-10-CM | POA: Diagnosis not present

## 2016-02-24 DIAGNOSIS — M4722 Other spondylosis with radiculopathy, cervical region: Secondary | ICD-10-CM | POA: Diagnosis not present

## 2016-02-24 DIAGNOSIS — M5136 Other intervertebral disc degeneration, lumbar region: Secondary | ICD-10-CM | POA: Diagnosis not present

## 2016-02-24 DIAGNOSIS — M9901 Segmental and somatic dysfunction of cervical region: Secondary | ICD-10-CM | POA: Diagnosis not present

## 2016-02-24 DIAGNOSIS — M50222 Other cervical disc displacement at C5-C6 level: Secondary | ICD-10-CM | POA: Diagnosis not present

## 2016-02-24 DIAGNOSIS — M5137 Other intervertebral disc degeneration, lumbosacral region: Secondary | ICD-10-CM | POA: Diagnosis not present

## 2016-02-27 DIAGNOSIS — M6240 Contracture of muscle, unspecified site: Secondary | ICD-10-CM | POA: Diagnosis not present

## 2016-02-27 DIAGNOSIS — M9903 Segmental and somatic dysfunction of lumbar region: Secondary | ICD-10-CM | POA: Diagnosis not present

## 2016-02-27 DIAGNOSIS — M791 Myalgia: Secondary | ICD-10-CM | POA: Diagnosis not present

## 2016-02-27 DIAGNOSIS — M9901 Segmental and somatic dysfunction of cervical region: Secondary | ICD-10-CM | POA: Diagnosis not present

## 2016-02-27 DIAGNOSIS — M50222 Other cervical disc displacement at C5-C6 level: Secondary | ICD-10-CM | POA: Diagnosis not present

## 2016-02-27 DIAGNOSIS — M4722 Other spondylosis with radiculopathy, cervical region: Secondary | ICD-10-CM | POA: Diagnosis not present

## 2016-02-27 DIAGNOSIS — M461 Sacroiliitis, not elsewhere classified: Secondary | ICD-10-CM | POA: Diagnosis not present

## 2016-02-27 DIAGNOSIS — M5136 Other intervertebral disc degeneration, lumbar region: Secondary | ICD-10-CM | POA: Diagnosis not present

## 2016-02-27 DIAGNOSIS — M545 Low back pain: Secondary | ICD-10-CM | POA: Diagnosis not present

## 2016-02-27 DIAGNOSIS — M5137 Other intervertebral disc degeneration, lumbosacral region: Secondary | ICD-10-CM | POA: Diagnosis not present

## 2016-02-27 DIAGNOSIS — R293 Abnormal posture: Secondary | ICD-10-CM | POA: Diagnosis not present

## 2016-03-03 DIAGNOSIS — M4722 Other spondylosis with radiculopathy, cervical region: Secondary | ICD-10-CM | POA: Diagnosis not present

## 2016-03-03 DIAGNOSIS — M9901 Segmental and somatic dysfunction of cervical region: Secondary | ICD-10-CM | POA: Diagnosis not present

## 2016-03-03 DIAGNOSIS — M5137 Other intervertebral disc degeneration, lumbosacral region: Secondary | ICD-10-CM | POA: Diagnosis not present

## 2016-03-03 DIAGNOSIS — M461 Sacroiliitis, not elsewhere classified: Secondary | ICD-10-CM | POA: Diagnosis not present

## 2016-03-03 DIAGNOSIS — M6240 Contracture of muscle, unspecified site: Secondary | ICD-10-CM | POA: Diagnosis not present

## 2016-03-03 DIAGNOSIS — M5136 Other intervertebral disc degeneration, lumbar region: Secondary | ICD-10-CM | POA: Diagnosis not present

## 2016-03-03 DIAGNOSIS — M791 Myalgia: Secondary | ICD-10-CM | POA: Diagnosis not present

## 2016-03-03 DIAGNOSIS — R293 Abnormal posture: Secondary | ICD-10-CM | POA: Diagnosis not present

## 2016-03-03 DIAGNOSIS — M9903 Segmental and somatic dysfunction of lumbar region: Secondary | ICD-10-CM | POA: Diagnosis not present

## 2016-03-03 DIAGNOSIS — M50222 Other cervical disc displacement at C5-C6 level: Secondary | ICD-10-CM | POA: Diagnosis not present

## 2016-03-05 DIAGNOSIS — M9903 Segmental and somatic dysfunction of lumbar region: Secondary | ICD-10-CM | POA: Diagnosis not present

## 2016-03-05 DIAGNOSIS — M5136 Other intervertebral disc degeneration, lumbar region: Secondary | ICD-10-CM | POA: Diagnosis not present

## 2016-03-05 DIAGNOSIS — M9901 Segmental and somatic dysfunction of cervical region: Secondary | ICD-10-CM | POA: Diagnosis not present

## 2016-03-05 DIAGNOSIS — M5137 Other intervertebral disc degeneration, lumbosacral region: Secondary | ICD-10-CM | POA: Diagnosis not present

## 2016-03-05 DIAGNOSIS — M4722 Other spondylosis with radiculopathy, cervical region: Secondary | ICD-10-CM | POA: Diagnosis not present

## 2016-03-05 DIAGNOSIS — R293 Abnormal posture: Secondary | ICD-10-CM | POA: Diagnosis not present

## 2016-03-05 DIAGNOSIS — M6240 Contracture of muscle, unspecified site: Secondary | ICD-10-CM | POA: Diagnosis not present

## 2016-03-05 DIAGNOSIS — M50222 Other cervical disc displacement at C5-C6 level: Secondary | ICD-10-CM | POA: Diagnosis not present

## 2016-03-05 DIAGNOSIS — M791 Myalgia: Secondary | ICD-10-CM | POA: Diagnosis not present

## 2016-03-05 DIAGNOSIS — M545 Low back pain: Secondary | ICD-10-CM | POA: Diagnosis not present

## 2016-03-05 DIAGNOSIS — M461 Sacroiliitis, not elsewhere classified: Secondary | ICD-10-CM | POA: Diagnosis not present

## 2016-03-09 DIAGNOSIS — M50222 Other cervical disc displacement at C5-C6 level: Secondary | ICD-10-CM | POA: Diagnosis not present

## 2016-03-09 DIAGNOSIS — M9903 Segmental and somatic dysfunction of lumbar region: Secondary | ICD-10-CM | POA: Diagnosis not present

## 2016-03-09 DIAGNOSIS — M6240 Contracture of muscle, unspecified site: Secondary | ICD-10-CM | POA: Diagnosis not present

## 2016-03-09 DIAGNOSIS — M791 Myalgia: Secondary | ICD-10-CM | POA: Diagnosis not present

## 2016-03-09 DIAGNOSIS — M4722 Other spondylosis with radiculopathy, cervical region: Secondary | ICD-10-CM | POA: Diagnosis not present

## 2016-03-09 DIAGNOSIS — M545 Low back pain: Secondary | ICD-10-CM | POA: Diagnosis not present

## 2016-03-09 DIAGNOSIS — M461 Sacroiliitis, not elsewhere classified: Secondary | ICD-10-CM | POA: Diagnosis not present

## 2016-03-09 DIAGNOSIS — M5136 Other intervertebral disc degeneration, lumbar region: Secondary | ICD-10-CM | POA: Diagnosis not present

## 2016-03-09 DIAGNOSIS — M5137 Other intervertebral disc degeneration, lumbosacral region: Secondary | ICD-10-CM | POA: Diagnosis not present

## 2016-03-09 DIAGNOSIS — R293 Abnormal posture: Secondary | ICD-10-CM | POA: Diagnosis not present

## 2016-03-09 DIAGNOSIS — M9901 Segmental and somatic dysfunction of cervical region: Secondary | ICD-10-CM | POA: Diagnosis not present

## 2016-03-12 DIAGNOSIS — M5137 Other intervertebral disc degeneration, lumbosacral region: Secondary | ICD-10-CM | POA: Diagnosis not present

## 2016-03-12 DIAGNOSIS — R293 Abnormal posture: Secondary | ICD-10-CM | POA: Diagnosis not present

## 2016-03-12 DIAGNOSIS — M9903 Segmental and somatic dysfunction of lumbar region: Secondary | ICD-10-CM | POA: Diagnosis not present

## 2016-03-12 DIAGNOSIS — M6240 Contracture of muscle, unspecified site: Secondary | ICD-10-CM | POA: Diagnosis not present

## 2016-03-12 DIAGNOSIS — M791 Myalgia: Secondary | ICD-10-CM | POA: Diagnosis not present

## 2016-03-12 DIAGNOSIS — M9901 Segmental and somatic dysfunction of cervical region: Secondary | ICD-10-CM | POA: Diagnosis not present

## 2016-03-12 DIAGNOSIS — M4722 Other spondylosis with radiculopathy, cervical region: Secondary | ICD-10-CM | POA: Diagnosis not present

## 2016-03-12 DIAGNOSIS — M50222 Other cervical disc displacement at C5-C6 level: Secondary | ICD-10-CM | POA: Diagnosis not present

## 2016-03-12 DIAGNOSIS — M545 Low back pain: Secondary | ICD-10-CM | POA: Diagnosis not present

## 2016-03-12 DIAGNOSIS — M461 Sacroiliitis, not elsewhere classified: Secondary | ICD-10-CM | POA: Diagnosis not present

## 2016-03-12 DIAGNOSIS — M5136 Other intervertebral disc degeneration, lumbar region: Secondary | ICD-10-CM | POA: Diagnosis not present

## 2016-03-16 DIAGNOSIS — M5136 Other intervertebral disc degeneration, lumbar region: Secondary | ICD-10-CM | POA: Diagnosis not present

## 2016-03-16 DIAGNOSIS — M461 Sacroiliitis, not elsewhere classified: Secondary | ICD-10-CM | POA: Diagnosis not present

## 2016-03-16 DIAGNOSIS — R293 Abnormal posture: Secondary | ICD-10-CM | POA: Diagnosis not present

## 2016-03-16 DIAGNOSIS — M9903 Segmental and somatic dysfunction of lumbar region: Secondary | ICD-10-CM | POA: Diagnosis not present

## 2016-03-16 DIAGNOSIS — M9901 Segmental and somatic dysfunction of cervical region: Secondary | ICD-10-CM | POA: Diagnosis not present

## 2016-03-16 DIAGNOSIS — M6240 Contracture of muscle, unspecified site: Secondary | ICD-10-CM | POA: Diagnosis not present

## 2016-03-16 DIAGNOSIS — M4722 Other spondylosis with radiculopathy, cervical region: Secondary | ICD-10-CM | POA: Diagnosis not present

## 2016-03-16 DIAGNOSIS — M545 Low back pain: Secondary | ICD-10-CM | POA: Diagnosis not present

## 2016-03-16 DIAGNOSIS — M791 Myalgia: Secondary | ICD-10-CM | POA: Diagnosis not present

## 2016-03-16 DIAGNOSIS — M50222 Other cervical disc displacement at C5-C6 level: Secondary | ICD-10-CM | POA: Diagnosis not present

## 2016-03-16 DIAGNOSIS — M5137 Other intervertebral disc degeneration, lumbosacral region: Secondary | ICD-10-CM | POA: Diagnosis not present

## 2016-04-02 DIAGNOSIS — M461 Sacroiliitis, not elsewhere classified: Secondary | ICD-10-CM | POA: Diagnosis not present

## 2016-04-02 DIAGNOSIS — M545 Low back pain: Secondary | ICD-10-CM | POA: Diagnosis not present

## 2016-04-02 DIAGNOSIS — R293 Abnormal posture: Secondary | ICD-10-CM | POA: Diagnosis not present

## 2016-04-02 DIAGNOSIS — M4722 Other spondylosis with radiculopathy, cervical region: Secondary | ICD-10-CM | POA: Diagnosis not present

## 2016-04-02 DIAGNOSIS — M50222 Other cervical disc displacement at C5-C6 level: Secondary | ICD-10-CM | POA: Diagnosis not present

## 2016-04-02 DIAGNOSIS — M9901 Segmental and somatic dysfunction of cervical region: Secondary | ICD-10-CM | POA: Diagnosis not present

## 2016-04-02 DIAGNOSIS — M6240 Contracture of muscle, unspecified site: Secondary | ICD-10-CM | POA: Diagnosis not present

## 2016-04-02 DIAGNOSIS — M5137 Other intervertebral disc degeneration, lumbosacral region: Secondary | ICD-10-CM | POA: Diagnosis not present

## 2016-04-02 DIAGNOSIS — M791 Myalgia: Secondary | ICD-10-CM | POA: Diagnosis not present

## 2016-04-02 DIAGNOSIS — M9903 Segmental and somatic dysfunction of lumbar region: Secondary | ICD-10-CM | POA: Diagnosis not present

## 2016-04-02 DIAGNOSIS — M5136 Other intervertebral disc degeneration, lumbar region: Secondary | ICD-10-CM | POA: Diagnosis not present

## 2016-04-13 DIAGNOSIS — M791 Myalgia: Secondary | ICD-10-CM | POA: Diagnosis not present

## 2016-04-13 DIAGNOSIS — M5136 Other intervertebral disc degeneration, lumbar region: Secondary | ICD-10-CM | POA: Diagnosis not present

## 2016-04-13 DIAGNOSIS — M4722 Other spondylosis with radiculopathy, cervical region: Secondary | ICD-10-CM | POA: Diagnosis not present

## 2016-04-13 DIAGNOSIS — M25561 Pain in right knee: Secondary | ICD-10-CM | POA: Diagnosis not present

## 2016-04-13 DIAGNOSIS — M6240 Contracture of muscle, unspecified site: Secondary | ICD-10-CM | POA: Diagnosis not present

## 2016-04-13 DIAGNOSIS — M1711 Unilateral primary osteoarthritis, right knee: Secondary | ICD-10-CM | POA: Diagnosis not present

## 2016-04-13 DIAGNOSIS — M50222 Other cervical disc displacement at C5-C6 level: Secondary | ICD-10-CM | POA: Diagnosis not present

## 2016-04-13 DIAGNOSIS — M5137 Other intervertebral disc degeneration, lumbosacral region: Secondary | ICD-10-CM | POA: Diagnosis not present

## 2016-04-13 DIAGNOSIS — M9901 Segmental and somatic dysfunction of cervical region: Secondary | ICD-10-CM | POA: Diagnosis not present

## 2016-04-13 DIAGNOSIS — M461 Sacroiliitis, not elsewhere classified: Secondary | ICD-10-CM | POA: Diagnosis not present

## 2016-04-13 DIAGNOSIS — M9903 Segmental and somatic dysfunction of lumbar region: Secondary | ICD-10-CM | POA: Diagnosis not present

## 2016-04-13 DIAGNOSIS — R293 Abnormal posture: Secondary | ICD-10-CM | POA: Diagnosis not present

## 2016-04-20 DIAGNOSIS — M1711 Unilateral primary osteoarthritis, right knee: Secondary | ICD-10-CM | POA: Diagnosis not present

## 2016-04-20 DIAGNOSIS — M9903 Segmental and somatic dysfunction of lumbar region: Secondary | ICD-10-CM | POA: Diagnosis not present

## 2016-04-20 DIAGNOSIS — R293 Abnormal posture: Secondary | ICD-10-CM | POA: Diagnosis not present

## 2016-04-20 DIAGNOSIS — K648 Other hemorrhoids: Secondary | ICD-10-CM | POA: Diagnosis not present

## 2016-04-20 DIAGNOSIS — M5137 Other intervertebral disc degeneration, lumbosacral region: Secondary | ICD-10-CM | POA: Diagnosis not present

## 2016-04-20 DIAGNOSIS — M9901 Segmental and somatic dysfunction of cervical region: Secondary | ICD-10-CM | POA: Diagnosis not present

## 2016-04-20 DIAGNOSIS — K644 Residual hemorrhoidal skin tags: Secondary | ICD-10-CM | POA: Diagnosis not present

## 2016-04-20 DIAGNOSIS — M25561 Pain in right knee: Secondary | ICD-10-CM | POA: Diagnosis not present

## 2016-04-20 DIAGNOSIS — M6240 Contracture of muscle, unspecified site: Secondary | ICD-10-CM | POA: Diagnosis not present

## 2016-04-20 DIAGNOSIS — M5136 Other intervertebral disc degeneration, lumbar region: Secondary | ICD-10-CM | POA: Diagnosis not present

## 2016-04-27 DIAGNOSIS — E039 Hypothyroidism, unspecified: Secondary | ICD-10-CM | POA: Diagnosis not present

## 2016-04-27 DIAGNOSIS — M810 Age-related osteoporosis without current pathological fracture: Secondary | ICD-10-CM | POA: Diagnosis not present

## 2016-04-27 DIAGNOSIS — D72819 Decreased white blood cell count, unspecified: Secondary | ICD-10-CM | POA: Diagnosis not present

## 2016-04-27 DIAGNOSIS — E7800 Pure hypercholesterolemia, unspecified: Secondary | ICD-10-CM | POA: Diagnosis not present

## 2016-04-27 DIAGNOSIS — Z0000 Encounter for general adult medical examination without abnormal findings: Secondary | ICD-10-CM | POA: Diagnosis not present

## 2016-04-27 DIAGNOSIS — Z1389 Encounter for screening for other disorder: Secondary | ICD-10-CM | POA: Diagnosis not present

## 2016-04-29 ENCOUNTER — Telehealth: Payer: Self-pay | Admitting: Internal Medicine

## 2016-04-29 DIAGNOSIS — T1512XA Foreign body in conjunctival sac, left eye, initial encounter: Secondary | ICD-10-CM | POA: Diagnosis not present

## 2016-04-29 DIAGNOSIS — H04123 Dry eye syndrome of bilateral lacrimal glands: Secondary | ICD-10-CM | POA: Diagnosis not present

## 2016-04-29 NOTE — Telephone Encounter (Signed)
Called to confirm appointment. Left voice message. Appointment letter and schedule mailed. Tiffany Fox.

## 2016-05-04 DIAGNOSIS — M5136 Other intervertebral disc degeneration, lumbar region: Secondary | ICD-10-CM | POA: Diagnosis not present

## 2016-05-04 DIAGNOSIS — M5137 Other intervertebral disc degeneration, lumbosacral region: Secondary | ICD-10-CM | POA: Diagnosis not present

## 2016-05-04 DIAGNOSIS — M6240 Contracture of muscle, unspecified site: Secondary | ICD-10-CM | POA: Diagnosis not present

## 2016-05-04 DIAGNOSIS — M9901 Segmental and somatic dysfunction of cervical region: Secondary | ICD-10-CM | POA: Diagnosis not present

## 2016-05-04 DIAGNOSIS — M9903 Segmental and somatic dysfunction of lumbar region: Secondary | ICD-10-CM | POA: Diagnosis not present

## 2016-05-04 DIAGNOSIS — M25561 Pain in right knee: Secondary | ICD-10-CM | POA: Diagnosis not present

## 2016-05-04 DIAGNOSIS — R293 Abnormal posture: Secondary | ICD-10-CM | POA: Diagnosis not present

## 2016-05-04 DIAGNOSIS — M1711 Unilateral primary osteoarthritis, right knee: Secondary | ICD-10-CM | POA: Diagnosis not present

## 2016-05-11 DIAGNOSIS — M9901 Segmental and somatic dysfunction of cervical region: Secondary | ICD-10-CM | POA: Diagnosis not present

## 2016-05-11 DIAGNOSIS — M25561 Pain in right knee: Secondary | ICD-10-CM | POA: Diagnosis not present

## 2016-05-11 DIAGNOSIS — R293 Abnormal posture: Secondary | ICD-10-CM | POA: Diagnosis not present

## 2016-05-11 DIAGNOSIS — M9903 Segmental and somatic dysfunction of lumbar region: Secondary | ICD-10-CM | POA: Diagnosis not present

## 2016-05-11 DIAGNOSIS — M5137 Other intervertebral disc degeneration, lumbosacral region: Secondary | ICD-10-CM | POA: Diagnosis not present

## 2016-05-11 DIAGNOSIS — M6240 Contracture of muscle, unspecified site: Secondary | ICD-10-CM | POA: Diagnosis not present

## 2016-05-11 DIAGNOSIS — M1711 Unilateral primary osteoarthritis, right knee: Secondary | ICD-10-CM | POA: Diagnosis not present

## 2016-05-11 DIAGNOSIS — M5136 Other intervertebral disc degeneration, lumbar region: Secondary | ICD-10-CM | POA: Diagnosis not present

## 2016-05-12 ENCOUNTER — Other Ambulatory Visit: Payer: Medicare Other

## 2016-05-12 ENCOUNTER — Ambulatory Visit: Payer: Medicare Other | Admitting: Internal Medicine

## 2016-05-13 DIAGNOSIS — Z85828 Personal history of other malignant neoplasm of skin: Secondary | ICD-10-CM | POA: Diagnosis not present

## 2016-05-13 DIAGNOSIS — D1801 Hemangioma of skin and subcutaneous tissue: Secondary | ICD-10-CM | POA: Diagnosis not present

## 2016-05-13 DIAGNOSIS — L304 Erythema intertrigo: Secondary | ICD-10-CM | POA: Diagnosis not present

## 2016-05-13 DIAGNOSIS — L814 Other melanin hyperpigmentation: Secondary | ICD-10-CM | POA: Diagnosis not present

## 2016-05-13 DIAGNOSIS — L821 Other seborrheic keratosis: Secondary | ICD-10-CM | POA: Diagnosis not present

## 2016-05-13 DIAGNOSIS — L570 Actinic keratosis: Secondary | ICD-10-CM | POA: Diagnosis not present

## 2016-05-14 DIAGNOSIS — Z1231 Encounter for screening mammogram for malignant neoplasm of breast: Secondary | ICD-10-CM | POA: Diagnosis not present

## 2016-05-14 DIAGNOSIS — M810 Age-related osteoporosis without current pathological fracture: Secondary | ICD-10-CM | POA: Diagnosis not present

## 2016-05-18 DIAGNOSIS — M9903 Segmental and somatic dysfunction of lumbar region: Secondary | ICD-10-CM | POA: Diagnosis not present

## 2016-05-18 DIAGNOSIS — M6240 Contracture of muscle, unspecified site: Secondary | ICD-10-CM | POA: Diagnosis not present

## 2016-05-18 DIAGNOSIS — M25561 Pain in right knee: Secondary | ICD-10-CM | POA: Diagnosis not present

## 2016-05-18 DIAGNOSIS — M5137 Other intervertebral disc degeneration, lumbosacral region: Secondary | ICD-10-CM | POA: Diagnosis not present

## 2016-05-18 DIAGNOSIS — R293 Abnormal posture: Secondary | ICD-10-CM | POA: Diagnosis not present

## 2016-05-18 DIAGNOSIS — M9901 Segmental and somatic dysfunction of cervical region: Secondary | ICD-10-CM | POA: Diagnosis not present

## 2016-05-18 DIAGNOSIS — M5136 Other intervertebral disc degeneration, lumbar region: Secondary | ICD-10-CM | POA: Diagnosis not present

## 2016-05-18 DIAGNOSIS — M1711 Unilateral primary osteoarthritis, right knee: Secondary | ICD-10-CM | POA: Diagnosis not present

## 2016-05-25 DIAGNOSIS — M6240 Contracture of muscle, unspecified site: Secondary | ICD-10-CM | POA: Diagnosis not present

## 2016-05-25 DIAGNOSIS — M5136 Other intervertebral disc degeneration, lumbar region: Secondary | ICD-10-CM | POA: Diagnosis not present

## 2016-05-25 DIAGNOSIS — M1711 Unilateral primary osteoarthritis, right knee: Secondary | ICD-10-CM | POA: Diagnosis not present

## 2016-05-25 DIAGNOSIS — M9901 Segmental and somatic dysfunction of cervical region: Secondary | ICD-10-CM | POA: Diagnosis not present

## 2016-05-25 DIAGNOSIS — M9903 Segmental and somatic dysfunction of lumbar region: Secondary | ICD-10-CM | POA: Diagnosis not present

## 2016-05-25 DIAGNOSIS — M25561 Pain in right knee: Secondary | ICD-10-CM | POA: Diagnosis not present

## 2016-05-25 DIAGNOSIS — R293 Abnormal posture: Secondary | ICD-10-CM | POA: Diagnosis not present

## 2016-05-25 DIAGNOSIS — M5137 Other intervertebral disc degeneration, lumbosacral region: Secondary | ICD-10-CM | POA: Diagnosis not present

## 2016-06-01 DIAGNOSIS — M179 Osteoarthritis of knee, unspecified: Secondary | ICD-10-CM | POA: Diagnosis not present

## 2016-06-01 DIAGNOSIS — M25561 Pain in right knee: Secondary | ICD-10-CM | POA: Diagnosis not present

## 2016-06-01 DIAGNOSIS — R293 Abnormal posture: Secondary | ICD-10-CM | POA: Diagnosis not present

## 2016-06-01 DIAGNOSIS — M9903 Segmental and somatic dysfunction of lumbar region: Secondary | ICD-10-CM | POA: Diagnosis not present

## 2016-06-01 DIAGNOSIS — M1711 Unilateral primary osteoarthritis, right knee: Secondary | ICD-10-CM | POA: Diagnosis not present

## 2016-06-01 DIAGNOSIS — M5137 Other intervertebral disc degeneration, lumbosacral region: Secondary | ICD-10-CM | POA: Diagnosis not present

## 2016-06-01 DIAGNOSIS — M791 Myalgia: Secondary | ICD-10-CM | POA: Diagnosis not present

## 2016-06-01 DIAGNOSIS — M9901 Segmental and somatic dysfunction of cervical region: Secondary | ICD-10-CM | POA: Diagnosis not present

## 2016-06-01 DIAGNOSIS — M461 Sacroiliitis, not elsewhere classified: Secondary | ICD-10-CM | POA: Diagnosis not present

## 2016-06-01 DIAGNOSIS — M6240 Contracture of muscle, unspecified site: Secondary | ICD-10-CM | POA: Diagnosis not present

## 2016-06-01 DIAGNOSIS — M5136 Other intervertebral disc degeneration, lumbar region: Secondary | ICD-10-CM | POA: Diagnosis not present

## 2016-08-05 DIAGNOSIS — F418 Other specified anxiety disorders: Secondary | ICD-10-CM | POA: Diagnosis not present

## 2016-08-05 DIAGNOSIS — F322 Major depressive disorder, single episode, severe without psychotic features: Secondary | ICD-10-CM | POA: Diagnosis not present

## 2016-08-26 DIAGNOSIS — F322 Major depressive disorder, single episode, severe without psychotic features: Secondary | ICD-10-CM | POA: Diagnosis not present

## 2016-08-26 DIAGNOSIS — F418 Other specified anxiety disorders: Secondary | ICD-10-CM | POA: Diagnosis not present

## 2016-09-03 DIAGNOSIS — Z85828 Personal history of other malignant neoplasm of skin: Secondary | ICD-10-CM | POA: Diagnosis not present

## 2016-09-03 DIAGNOSIS — L570 Actinic keratosis: Secondary | ICD-10-CM | POA: Diagnosis not present

## 2016-09-16 DIAGNOSIS — F322 Major depressive disorder, single episode, severe without psychotic features: Secondary | ICD-10-CM | POA: Diagnosis not present

## 2016-09-16 DIAGNOSIS — F418 Other specified anxiety disorders: Secondary | ICD-10-CM | POA: Diagnosis not present

## 2016-10-19 DIAGNOSIS — F322 Major depressive disorder, single episode, severe without psychotic features: Secondary | ICD-10-CM | POA: Diagnosis not present

## 2016-10-19 DIAGNOSIS — F418 Other specified anxiety disorders: Secondary | ICD-10-CM | POA: Diagnosis not present

## 2016-11-11 DIAGNOSIS — H04123 Dry eye syndrome of bilateral lacrimal glands: Secondary | ICD-10-CM | POA: Diagnosis not present

## 2017-02-08 DIAGNOSIS — M1711 Unilateral primary osteoarthritis, right knee: Secondary | ICD-10-CM | POA: Diagnosis not present

## 2017-02-15 DIAGNOSIS — M1711 Unilateral primary osteoarthritis, right knee: Secondary | ICD-10-CM | POA: Diagnosis not present

## 2017-02-16 DIAGNOSIS — E039 Hypothyroidism, unspecified: Secondary | ICD-10-CM | POA: Diagnosis not present

## 2017-02-16 DIAGNOSIS — G479 Sleep disorder, unspecified: Secondary | ICD-10-CM | POA: Diagnosis not present

## 2017-02-16 DIAGNOSIS — F322 Major depressive disorder, single episode, severe without psychotic features: Secondary | ICD-10-CM | POA: Diagnosis not present

## 2017-02-17 DIAGNOSIS — M25561 Pain in right knee: Secondary | ICD-10-CM | POA: Diagnosis not present

## 2017-02-17 DIAGNOSIS — R262 Difficulty in walking, not elsewhere classified: Secondary | ICD-10-CM | POA: Diagnosis not present

## 2017-02-17 DIAGNOSIS — M1711 Unilateral primary osteoarthritis, right knee: Secondary | ICD-10-CM | POA: Diagnosis not present

## 2017-02-19 DIAGNOSIS — R262 Difficulty in walking, not elsewhere classified: Secondary | ICD-10-CM | POA: Diagnosis not present

## 2017-02-19 DIAGNOSIS — M25561 Pain in right knee: Secondary | ICD-10-CM | POA: Diagnosis not present

## 2017-02-19 DIAGNOSIS — M1711 Unilateral primary osteoarthritis, right knee: Secondary | ICD-10-CM | POA: Diagnosis not present

## 2017-02-22 DIAGNOSIS — M1711 Unilateral primary osteoarthritis, right knee: Secondary | ICD-10-CM | POA: Diagnosis not present

## 2017-02-23 DIAGNOSIS — M25561 Pain in right knee: Secondary | ICD-10-CM | POA: Diagnosis not present

## 2017-02-23 DIAGNOSIS — R262 Difficulty in walking, not elsewhere classified: Secondary | ICD-10-CM | POA: Diagnosis not present

## 2017-02-23 DIAGNOSIS — M1711 Unilateral primary osteoarthritis, right knee: Secondary | ICD-10-CM | POA: Diagnosis not present

## 2017-02-25 DIAGNOSIS — R262 Difficulty in walking, not elsewhere classified: Secondary | ICD-10-CM | POA: Diagnosis not present

## 2017-02-25 DIAGNOSIS — M25561 Pain in right knee: Secondary | ICD-10-CM | POA: Diagnosis not present

## 2017-02-25 DIAGNOSIS — M1711 Unilateral primary osteoarthritis, right knee: Secondary | ICD-10-CM | POA: Diagnosis not present

## 2017-03-02 DIAGNOSIS — R262 Difficulty in walking, not elsewhere classified: Secondary | ICD-10-CM | POA: Diagnosis not present

## 2017-03-02 DIAGNOSIS — M1711 Unilateral primary osteoarthritis, right knee: Secondary | ICD-10-CM | POA: Diagnosis not present

## 2017-03-02 DIAGNOSIS — M25561 Pain in right knee: Secondary | ICD-10-CM | POA: Diagnosis not present

## 2017-03-04 DIAGNOSIS — M25561 Pain in right knee: Secondary | ICD-10-CM | POA: Diagnosis not present

## 2017-03-04 DIAGNOSIS — M1711 Unilateral primary osteoarthritis, right knee: Secondary | ICD-10-CM | POA: Diagnosis not present

## 2017-03-04 DIAGNOSIS — R262 Difficulty in walking, not elsewhere classified: Secondary | ICD-10-CM | POA: Diagnosis not present

## 2017-03-09 DIAGNOSIS — R262 Difficulty in walking, not elsewhere classified: Secondary | ICD-10-CM | POA: Diagnosis not present

## 2017-03-09 DIAGNOSIS — M25561 Pain in right knee: Secondary | ICD-10-CM | POA: Diagnosis not present

## 2017-03-09 DIAGNOSIS — M1711 Unilateral primary osteoarthritis, right knee: Secondary | ICD-10-CM | POA: Diagnosis not present

## 2017-03-11 DIAGNOSIS — R262 Difficulty in walking, not elsewhere classified: Secondary | ICD-10-CM | POA: Diagnosis not present

## 2017-03-11 DIAGNOSIS — M25561 Pain in right knee: Secondary | ICD-10-CM | POA: Diagnosis not present

## 2017-03-11 DIAGNOSIS — M1711 Unilateral primary osteoarthritis, right knee: Secondary | ICD-10-CM | POA: Diagnosis not present

## 2017-03-15 DIAGNOSIS — M1711 Unilateral primary osteoarthritis, right knee: Secondary | ICD-10-CM | POA: Diagnosis not present

## 2017-03-15 DIAGNOSIS — R262 Difficulty in walking, not elsewhere classified: Secondary | ICD-10-CM | POA: Diagnosis not present

## 2017-03-15 DIAGNOSIS — M25561 Pain in right knee: Secondary | ICD-10-CM | POA: Diagnosis not present

## 2017-03-18 DIAGNOSIS — R262 Difficulty in walking, not elsewhere classified: Secondary | ICD-10-CM | POA: Diagnosis not present

## 2017-03-18 DIAGNOSIS — M1711 Unilateral primary osteoarthritis, right knee: Secondary | ICD-10-CM | POA: Diagnosis not present

## 2017-03-18 DIAGNOSIS — M25561 Pain in right knee: Secondary | ICD-10-CM | POA: Diagnosis not present

## 2017-03-25 DIAGNOSIS — M1711 Unilateral primary osteoarthritis, right knee: Secondary | ICD-10-CM | POA: Diagnosis not present

## 2017-05-04 DIAGNOSIS — E039 Hypothyroidism, unspecified: Secondary | ICD-10-CM | POA: Diagnosis not present

## 2017-05-04 DIAGNOSIS — Z0000 Encounter for general adult medical examination without abnormal findings: Secondary | ICD-10-CM | POA: Diagnosis not present

## 2017-05-04 DIAGNOSIS — G479 Sleep disorder, unspecified: Secondary | ICD-10-CM | POA: Diagnosis not present

## 2017-05-04 DIAGNOSIS — E7800 Pure hypercholesterolemia, unspecified: Secondary | ICD-10-CM | POA: Diagnosis not present

## 2017-05-04 DIAGNOSIS — F322 Major depressive disorder, single episode, severe without psychotic features: Secondary | ICD-10-CM | POA: Diagnosis not present

## 2017-05-04 DIAGNOSIS — R627 Adult failure to thrive: Secondary | ICD-10-CM | POA: Diagnosis not present

## 2017-05-13 DIAGNOSIS — L570 Actinic keratosis: Secondary | ICD-10-CM | POA: Diagnosis not present

## 2017-05-13 DIAGNOSIS — D692 Other nonthrombocytopenic purpura: Secondary | ICD-10-CM | POA: Diagnosis not present

## 2017-05-13 DIAGNOSIS — Z85828 Personal history of other malignant neoplasm of skin: Secondary | ICD-10-CM | POA: Diagnosis not present

## 2017-05-13 DIAGNOSIS — L821 Other seborrheic keratosis: Secondary | ICD-10-CM | POA: Diagnosis not present

## 2017-05-13 DIAGNOSIS — L814 Other melanin hyperpigmentation: Secondary | ICD-10-CM | POA: Diagnosis not present

## 2017-05-13 DIAGNOSIS — L820 Inflamed seborrheic keratosis: Secondary | ICD-10-CM | POA: Diagnosis not present

## 2017-05-13 DIAGNOSIS — D1801 Hemangioma of skin and subcutaneous tissue: Secondary | ICD-10-CM | POA: Diagnosis not present

## 2017-05-17 DIAGNOSIS — F332 Major depressive disorder, recurrent severe without psychotic features: Secondary | ICD-10-CM | POA: Diagnosis not present

## 2017-05-17 DIAGNOSIS — F411 Generalized anxiety disorder: Secondary | ICD-10-CM | POA: Diagnosis not present

## 2017-05-19 DIAGNOSIS — H40003 Preglaucoma, unspecified, bilateral: Secondary | ICD-10-CM | POA: Diagnosis not present

## 2017-05-19 DIAGNOSIS — H04123 Dry eye syndrome of bilateral lacrimal glands: Secondary | ICD-10-CM | POA: Diagnosis not present

## 2017-05-26 DIAGNOSIS — D72819 Decreased white blood cell count, unspecified: Secondary | ICD-10-CM | POA: Diagnosis not present

## 2017-06-02 DIAGNOSIS — D72819 Decreased white blood cell count, unspecified: Secondary | ICD-10-CM | POA: Diagnosis not present

## 2017-06-02 DIAGNOSIS — F322 Major depressive disorder, single episode, severe without psychotic features: Secondary | ICD-10-CM | POA: Diagnosis not present

## 2017-06-14 DIAGNOSIS — F411 Generalized anxiety disorder: Secondary | ICD-10-CM | POA: Diagnosis not present

## 2017-06-14 DIAGNOSIS — F332 Major depressive disorder, recurrent severe without psychotic features: Secondary | ICD-10-CM | POA: Diagnosis not present

## 2017-07-19 DIAGNOSIS — F332 Major depressive disorder, recurrent severe without psychotic features: Secondary | ICD-10-CM | POA: Diagnosis not present

## 2017-09-15 DIAGNOSIS — F332 Major depressive disorder, recurrent severe without psychotic features: Secondary | ICD-10-CM | POA: Diagnosis not present

## 2017-10-19 DIAGNOSIS — L821 Other seborrheic keratosis: Secondary | ICD-10-CM | POA: Diagnosis not present

## 2017-10-19 DIAGNOSIS — Z85828 Personal history of other malignant neoplasm of skin: Secondary | ICD-10-CM | POA: Diagnosis not present

## 2017-10-19 DIAGNOSIS — L570 Actinic keratosis: Secondary | ICD-10-CM | POA: Diagnosis not present

## 2017-10-19 DIAGNOSIS — L814 Other melanin hyperpigmentation: Secondary | ICD-10-CM | POA: Diagnosis not present

## 2017-12-01 DIAGNOSIS — K644 Residual hemorrhoidal skin tags: Secondary | ICD-10-CM | POA: Diagnosis not present

## 2017-12-01 DIAGNOSIS — K648 Other hemorrhoids: Secondary | ICD-10-CM | POA: Diagnosis not present

## 2017-12-06 DIAGNOSIS — F332 Major depressive disorder, recurrent severe without psychotic features: Secondary | ICD-10-CM | POA: Diagnosis not present

## 2017-12-08 DIAGNOSIS — H40003 Preglaucoma, unspecified, bilateral: Secondary | ICD-10-CM | POA: Diagnosis not present

## 2018-03-10 DIAGNOSIS — F332 Major depressive disorder, recurrent severe without psychotic features: Secondary | ICD-10-CM | POA: Diagnosis not present

## 2018-03-10 DIAGNOSIS — F411 Generalized anxiety disorder: Secondary | ICD-10-CM | POA: Diagnosis not present

## 2018-04-08 DIAGNOSIS — R1011 Right upper quadrant pain: Secondary | ICD-10-CM | POA: Diagnosis not present

## 2018-04-11 ENCOUNTER — Other Ambulatory Visit: Payer: Self-pay | Admitting: Family Medicine

## 2018-04-11 DIAGNOSIS — R1011 Right upper quadrant pain: Secondary | ICD-10-CM

## 2018-04-18 ENCOUNTER — Ambulatory Visit
Admission: RE | Admit: 2018-04-18 | Discharge: 2018-04-18 | Disposition: A | Discharge status: Home / Self Care | Payer: Medicare Other | Source: Ambulatory Visit | Attending: Family Medicine | Admitting: Family Medicine

## 2018-04-18 DIAGNOSIS — N281 Cyst of kidney, acquired: Secondary | ICD-10-CM | POA: Diagnosis not present

## 2018-04-18 DIAGNOSIS — R1011 Right upper quadrant pain: Secondary | ICD-10-CM

## 2018-05-18 DIAGNOSIS — E7800 Pure hypercholesterolemia, unspecified: Secondary | ICD-10-CM | POA: Diagnosis not present

## 2018-05-18 DIAGNOSIS — R197 Diarrhea, unspecified: Secondary | ICD-10-CM | POA: Diagnosis not present

## 2018-05-18 DIAGNOSIS — E039 Hypothyroidism, unspecified: Secondary | ICD-10-CM | POA: Diagnosis not present

## 2018-05-18 DIAGNOSIS — F322 Major depressive disorder, single episode, severe without psychotic features: Secondary | ICD-10-CM | POA: Diagnosis not present

## 2018-05-18 DIAGNOSIS — Z0000 Encounter for general adult medical examination without abnormal findings: Secondary | ICD-10-CM | POA: Diagnosis not present

## 2018-05-31 DIAGNOSIS — F411 Generalized anxiety disorder: Secondary | ICD-10-CM | POA: Diagnosis not present

## 2018-05-31 DIAGNOSIS — F332 Major depressive disorder, recurrent severe without psychotic features: Secondary | ICD-10-CM | POA: Diagnosis not present

## 2018-06-23 DIAGNOSIS — R197 Diarrhea, unspecified: Secondary | ICD-10-CM | POA: Diagnosis not present

## 2018-06-27 DIAGNOSIS — F332 Major depressive disorder, recurrent severe without psychotic features: Secondary | ICD-10-CM | POA: Diagnosis not present

## 2018-07-20 DIAGNOSIS — M1711 Unilateral primary osteoarthritis, right knee: Secondary | ICD-10-CM | POA: Diagnosis not present

## 2018-07-27 DIAGNOSIS — E039 Hypothyroidism, unspecified: Secondary | ICD-10-CM | POA: Diagnosis not present

## 2018-07-27 DIAGNOSIS — R197 Diarrhea, unspecified: Secondary | ICD-10-CM | POA: Diagnosis not present

## 2018-08-01 DIAGNOSIS — R768 Other specified abnormal immunological findings in serum: Secondary | ICD-10-CM | POA: Diagnosis not present

## 2018-08-02 DIAGNOSIS — L814 Other melanin hyperpigmentation: Secondary | ICD-10-CM | POA: Diagnosis not present

## 2018-08-02 DIAGNOSIS — L821 Other seborrheic keratosis: Secondary | ICD-10-CM | POA: Diagnosis not present

## 2018-08-02 DIAGNOSIS — D692 Other nonthrombocytopenic purpura: Secondary | ICD-10-CM | POA: Diagnosis not present

## 2018-08-02 DIAGNOSIS — D485 Neoplasm of uncertain behavior of skin: Secondary | ICD-10-CM | POA: Diagnosis not present

## 2018-08-02 DIAGNOSIS — Z85828 Personal history of other malignant neoplasm of skin: Secondary | ICD-10-CM | POA: Diagnosis not present

## 2018-08-02 DIAGNOSIS — L570 Actinic keratosis: Secondary | ICD-10-CM | POA: Diagnosis not present

## 2018-08-23 DIAGNOSIS — F332 Major depressive disorder, recurrent severe without psychotic features: Secondary | ICD-10-CM | POA: Diagnosis not present

## 2018-10-04 ENCOUNTER — Other Ambulatory Visit: Payer: Self-pay | Admitting: Orthopaedic Surgery

## 2018-10-10 ENCOUNTER — Encounter (HOSPITAL_COMMUNITY): Payer: Self-pay

## 2018-10-10 DIAGNOSIS — M25552 Pain in left hip: Secondary | ICD-10-CM | POA: Diagnosis not present

## 2018-10-10 NOTE — Pre-Procedure Instructions (Signed)
JULIEANN DRUMMONDS  10/10/2018      [place], [place] [address] Phone: [phone] Fax: [phone]  [facility] [place], [place] [address] Phone: [phone] Fax: [phone]    Your procedure is scheduled on Tuesday January 14th.  Report to [facility] Admitting at 10:30 A.M.  Call this number if you have problems the morning of surgery:  [phone]   Remember:  Do not eat or drink after midnight.     Take these medicines the morning of surgery with A SIP OF WATER  cholestyramine (QUESTRAN)  escitalopram (LEXAPRO) thyroid (ARMOUR)    7 days prior to surgery STOP taking any Aspirin(unless otherwise instructed by your surgeon), Aleve, Naproxen, Ibuprofen, Motrin, Advil, Goody's, BC's, all herbal medications, fish oil, and all vitamins   Do not wear jewelry, make-up or nail polish.  Do not wear lotions, powders, or perfumes, or deodorant.  Do not shave 48 hours prior to surgery.    Do not bring valuables to the hospital.  [facility] is not responsible for any belongings or valuables.  Contacts, eyeglasses, hearing aids, dentures or bridgework may not be worn into surgery.  Leave your suitcase in the car.  After surgery it may be brought to your room.  For patients admitted to the hospital, discharge time will be determined by your treatment team.  Patients discharged the day of surgery will not be allowed to drive home.   [place]- Preparing For Surgery  Before surgery, you can play an important role. Because skin is not sterile, your skin needs to be as free of germs as possible. You can reduce the number of germs on your skin by washing with CHG (chlorahexidine gluconate) Soap before surgery.  CHG is an antiseptic cleaner which kills germs and bonds with the skin to continue killing germs even  after washing.    Oral Hygiene is also important to reduce your risk of infection.  Remember - BRUSH YOUR TEETH THE MORNING OF SURGERY WITH YOUR REGULAR TOOTHPASTE  Please do not use if you have an allergy to CHG or antibacterial soaps. If your skin becomes reddened/irritated stop using the CHG.  Do not shave (including legs and underarms) for at least 48 hours prior to first CHG shower. It is OK to shave your face.  Please follow these instructions carefully.   1. Shower the NIGHT BEFORE SURGERY and the MORNING OF SURGERY with CHG.   2. If you chose to wash your hair, wash your hair first as usual with your normal shampoo.  3. After you shampoo, rinse your hair and body thoroughly to remove the shampoo.  4. Use CHG as you would any other liquid soap. You can apply CHG directly to the skin and wash gently with a scrungie or a clean washcloth.   5. Apply the CHG Soap to your body ONLY FROM THE NECK DOWN.  Do not use on open wounds or open sores. Avoid contact with your eyes, ears, mouth and genitals (private parts). Wash Face and genitals (private parts)  with your normal soap.  6. Wash thoroughly, paying special attention to the area where your surgery will be performed.  7. Thoroughly rinse your body with warm water from the neck down.  8. DO NOT shower/wash with your normal soap after using and rinsing off the  CHG Soap.  9. Pat yourself dry with a CLEAN TOWEL.  10. Wear CLEAN PAJAMAS to bed the night before surgery, wear comfortable clothes the morning of surgery  11. Place CLEAN SHEETS on your bed the night of your first shower and DO NOT SLEEP WITH PETS.    Day of Surgery: Shower as stated above. Do not apply any deodorants/lotions.  Please wear clean clothes to the hospital/surgery center.   Remember to brush your teeth WITH YOUR REGULAR TOOTHPASTE.   Please read over the following fact sheets that you were given.

## 2018-10-11 ENCOUNTER — Encounter (HOSPITAL_COMMUNITY): Payer: Self-pay

## 2018-10-11 ENCOUNTER — Encounter (HOSPITAL_COMMUNITY)
Admission: RE | Admit: 2018-10-11 | Discharge: 2018-10-11 | Disposition: A | Discharge status: Home / Self Care | Payer: Medicare Other | Source: Ambulatory Visit | Attending: Orthopaedic Surgery | Admitting: Orthopaedic Surgery

## 2018-10-11 ENCOUNTER — Other Ambulatory Visit: Payer: Self-pay

## 2018-10-11 DIAGNOSIS — I517 Cardiomegaly: Secondary | ICD-10-CM | POA: Insufficient documentation

## 2018-10-11 DIAGNOSIS — Z01818 Encounter for other preprocedural examination: Secondary | ICD-10-CM

## 2018-10-11 DIAGNOSIS — R064 Hyperventilation: Secondary | ICD-10-CM | POA: Diagnosis not present

## 2018-10-11 HISTORY — DX: Anxiety disorder, unspecified: F41.9

## 2018-10-11 HISTORY — DX: Unspecified osteoarthritis, unspecified site: M19.90

## 2018-10-11 HISTORY — DX: Hypothyroidism, unspecified: E03.9

## 2018-10-11 HISTORY — DX: Major depressive disorder, single episode, unspecified: F32.9

## 2018-10-11 HISTORY — DX: Depression, unspecified: F32.A

## 2018-10-11 LAB — CBC WITH DIFFERENTIAL/PLATELET
Abs Immature Granulocytes: 0.01 10*3/uL (ref 0.00–0.07)
Basophils Absolute: 0 10*3/uL (ref 0.0–0.1)
Basophils Relative: 1 %
Eosinophils Absolute: 0.1 10*3/uL (ref 0.0–0.5)
Eosinophils Relative: 3 %
HCT: 42.7 % (ref 36.0–46.0)
Hemoglobin: 13.6 g/dL (ref 12.0–15.0)
Immature Granulocytes: 0 %
LYMPHS ABS: 0.8 10*3/uL (ref 0.7–4.0)
Lymphocytes Relative: 24 %
MCH: 31.7 pg (ref 26.0–34.0)
MCHC: 31.9 g/dL (ref 30.0–36.0)
MCV: 99.5 fL (ref 80.0–100.0)
MONO ABS: 0.4 10*3/uL (ref 0.1–1.0)
MONOS PCT: 11 %
Neutro Abs: 2.1 10*3/uL (ref 1.7–7.7)
Neutrophils Relative %: 61 %
Platelets: 215 10*3/uL (ref 150–400)
RBC: 4.29 MIL/uL (ref 3.87–5.11)
RDW: 13.6 % (ref 11.5–15.5)
WBC: 3.4 10*3/uL — ABNORMAL LOW (ref 4.0–10.5)
nRBC: 0 % (ref 0.0–0.2)

## 2018-10-11 LAB — ABO/RH: ABO/RH(D): A POS

## 2018-10-11 LAB — BASIC METABOLIC PANEL
Anion gap: 10 (ref 5–15)
BUN: 9 mg/dL (ref 8–23)
CO2: 21 mmol/L — AB (ref 22–32)
Calcium: 9.3 mg/dL (ref 8.9–10.3)
Chloride: 102 mmol/L (ref 98–111)
Creatinine, Ser: 0.75 mg/dL (ref 0.44–1.00)
GFR calc Af Amer: >60 mL/min (ref >60)
GFR calc non Af Amer: >60 mL/min (ref >60)
Glucose, Bld: 95 mg/dL (ref 70–99)
Potassium: 3.7 mmol/L (ref 3.5–5.1)
Sodium: 133 mmol/L — ABNORMAL LOW (ref 135–145)

## 2018-10-11 LAB — SURGICAL PCR SCREEN
MRSA, PCR: NEGATIVE
STAPHYLOCOCCUS AUREUS: NEGATIVE

## 2018-10-11 LAB — APTT: aPTT: 28 seconds (ref 24–36)

## 2018-10-11 LAB — URINALYSIS, ROUTINE W REFLEX MICROSCOPIC
Bilirubin Urine: NEGATIVE
Glucose, UA: NEGATIVE mg/dL
Hgb urine dipstick: NEGATIVE
Ketones, ur: NEGATIVE mg/dL
Leukocytes, UA: NEGATIVE
Nitrite: NEGATIVE
PROTEIN: NEGATIVE mg/dL
Specific Gravity, Urine: 1.016 (ref 1.005–1.030)
pH: 5 (ref 5.0–8.0)

## 2018-10-11 LAB — TYPE AND SCREEN
ABO/RH(D): A POS
Antibody Screen: NEGATIVE

## 2018-10-11 LAB — PROTIME-INR
INR: 0.98
Prothrombin Time: 12.9 seconds (ref 11.4–15.2)

## 2018-10-11 NOTE — Progress Notes (Signed)
PCP - Dr. Doreene Burke [place] Cardiologist - denies  Chest x-ray - 10/11/18 EKG - 10/11/18 Stress Test - 30 + years ago- unsure where she had this done, or who performed it.  ECHO - pt states she thinks she may have had an ECHO in 2012 at [facility] ED- no records in [place]. Cardiac Cath - denies  Sleep Study - has had sleep study several years ago- does not have OSA and does not use CPAP  Aspirin Instructions: Patient instructed to hold all Aspirin, NSAID's, herbal medications, fish oil and vitamins 7 days prior to surgery.   Anesthesia review: no  Patient denies shortness of breath, fever, cough and chest pain at PAT appointment   Patient verbalized understanding of instructions that were given to them at the PAT appointment. Patient was also instructed that they will need to review over the PAT instructions again at home before surgery.\

## 2018-10-12 DIAGNOSIS — M25552 Pain in left hip: Secondary | ICD-10-CM | POA: Diagnosis not present

## 2018-10-12 DIAGNOSIS — M1612 Unilateral primary osteoarthritis, left hip: Secondary | ICD-10-CM | POA: Diagnosis not present

## 2018-10-14 ENCOUNTER — Other Ambulatory Visit: Payer: Self-pay | Admitting: Orthopaedic Surgery

## 2018-10-14 NOTE — Care Plan (Signed)
SPoke with patient prior to surgery. Will discharge to home with family and friends to assist. Has equipment at home. Will have HHPT and transition to OPPT as planned.     Ladell Heads, [place]

## 2018-10-17 MED ORDER — BUPIVACAINE LIPOSOME 1.3 % IJ SUSP
20.0000 mL | INTRAMUSCULAR | Status: AC
  Administered 2018-10-18: 20 mL
  Filled 2018-10-17 (×2): qty 20

## 2018-10-17 MED ORDER — TRANEXAMIC ACID-NACL 1000-0.7 MG/100ML-% IV SOLN
1000.0000 mg | INTRAVENOUS | Status: AC
  Administered 2018-10-18: 1000 mg via INTRAVENOUS
  Filled 2018-10-17: qty 100

## 2018-10-17 MED ORDER — TRANEXAMIC ACID 1000 MG/10ML IV SOLN
2000.0000 mg | INTRAVENOUS | Status: AC
  Administered 2018-10-18: 2000 mg via TOPICAL
  Filled 2018-10-17 (×2): qty 20

## 2018-10-17 NOTE — H&P (Signed)
TOTAL KNEE ADMISSION H&P  Patient is being admitted for right total knee arthroplasty.  Subjective:  Chief Complaint:right knee pain.  HPI: Tiffany Fox, 85 y.o. female, has a history of pain and functional disability in the right knee due to arthritis and has failed non-surgical conservative treatments for greater than 12 weeks to includeNSAID's and/or analgesics, corticosteriod injections, viscosupplementation injections, flexibility and strengthening excercises, use of assistive devices, weight reduction as appropriate and activity modification.  Onset of symptoms was gradual, starting 5 years ago with gradually worsening course since that time. The patient noted no past surgery on the right knee(s).  Patient currently rates pain in the right knee(s) at 10 out of 10 with activity. Patient has night pain, worsening of pain with activity and weight bearing, pain that interferes with activities of daily living, crepitus and joint swelling.  Patient has evidence of subchondral cysts, subchondral sclerosis, periarticular osteophytes and joint space narrowing by imaging studies. There is no active infection.  Patient Active Problem List   Diagnosis Date Noted  . Leukocytopenia 12/18/2014  . Other pancytopenia ([place]) 12/18/2014   Past Medical History:  Diagnosis Date  . Anxiety   . Arthritis   . Depression   . Hypertension   . Hypothyroidism   . Leukocytopenia     Past Surgical History:  Procedure Laterality Date  . APPENDECTOMY  2012  . EYE SURGERY Bilateral 2012  . hip fractuer Left 05.2011    Current Facility-Administered Medications  Medication Dose Route Frequency Provider Last Rate Last Dose  . [START ON 10/18/2018] bupivacaine liposome (EXPAREL) 1.3 % injection 266 mg  20 mL Infiltration To OR Melrose Nakayama, MD      . Derrill Memo ON 10/18/2018] tranexamic acid (CYKLOKAPRON) 2,000 mg in sodium chloride 0.9 % 50 mL Topical Application  7,628 mg Topical To OR Melrose Nakayama, MD       . Derrill Memo ON 10/18/2018] tranexamic acid (CYKLOKAPRON) IVPB 1,000 mg  1,000 mg Intravenous To OR Melrose Nakayama, MD       Current Outpatient Medications  Medication Sig Dispense Refill Last Dose  . cholestyramine (QUESTRAN) 4 g packet Take 4 g by mouth 2 (two) times daily.     Marland Kitchen escitalopram (LEXAPRO) 10 MG tablet Take 10 mg by mouth daily.     . eszopiclone (LUNESTA) 2 MG TABS tablet Take 2 mg by mouth at bedtime. Take immediately before bedtime     . LORazepam (ATIVAN) 0.5 MG tablet Take 0.5 mg by mouth at bedtime.     . Multiple Vitamins-Minerals (HAIR/SKIN/NAILS PO) Take 1 tablet by mouth daily.   Taking  . omega-3 acid ethyl esters (LOVAZA) 1 G capsule Take 1 g by mouth 2 (two) times daily.     Taking  . thyroid (ARMOUR) 60 MG tablet Take 60 mg by mouth daily before breakfast.    Taking   Allergies  Allergen Reactions  . Lisinopril Rash    Social History   Tobacco Use  . Smoking status: Never Smoker  . Smokeless tobacco: Never Used  Substance Use Topics  . Alcohol use: Yes    Alcohol/week: 7.0 standard drinks    Types: 7 Glasses of wine per week    No family history on file.   Review of Systems  Musculoskeletal: Positive for joint pain.       Right knee  All other systems reviewed and are negative.   Objective:  Physical Exam  Constitutional: She is oriented to person, place, and time. She appears  well-developed and well-nourished.  HENT:  Head: Normocephalic and atraumatic.  Eyes: Pupils are equal, round, and reactive to light.  Neck: Normal range of motion.  Cardiovascular: Normal rate and regular rhythm.  Respiratory: Effort normal.  GI: Soft.  Musculoskeletal:     Comments: Right knee motion remains good at 0-130.  She has crepitation but no effusion.  Her pain is patellofemoral and medial.  Hip motion is full.  Straight leg raise is negative.  She has intact sensation and motor function in her feet and palpable pulses on both sides.  Neurological: She is  alert and oriented to person, place, and time.  Skin: Skin is warm and dry.  Psychiatric: She has a normal mood and affect. Her behavior is normal. Judgment and thought content normal.    Vital signs in last 24 hours:    Labs:   Estimated body mass index is 22.37 kg/m as calculated from the following:   Height as of 10/11/18: 5' 3.5" (1.613 m).   Weight as of 10/11/18: 58.2 kg.   Imaging Review Plain radiographs demonstrate severe degenerative joint disease of the right knee(s). The overall alignment isneutral. The bone quality appears to be good for age and reported activity level.   Preoperative templating of the joint replacement has been completed, documented, and submitted to the Operating Room personnel in order to optimize intra-operative equipment management.    Patient's anticipated LOS is less than 2 midnights, meeting these requirements: - Younger than 108 - Lives within 1 hour of care - Has a competent adult at home to recover with post-op recover - NO history of  - Chronic pain requiring opiods  - Diabetes  - Coronary Artery Disease  - Heart failure  - Heart attack  - Stroke  - DVT/VTE  - Cardiac arrhythmia  - Respiratory Failure/COPD  - Renal failure  - Anemia  - Advanced Liver disease  Assessment/Plan:  End stage primary arthritis, right knee   The patient history, physical examination, clinical judgment of the provider and imaging studies are consistent with end stage degenerative joint disease of the right knee(s) and total knee arthroplasty is deemed medically necessary. The treatment options including medical management, injection therapy arthroscopy and arthroplasty were discussed at length. The risks and benefits of total knee arthroplasty were presented and reviewed. The risks due to aseptic loosening, infection, stiffness, patella tracking problems, thromboembolic complications and other imponderables were discussed. The patient acknowledged the  explanation, agreed to proceed with the plan and consent was signed. Patient is being admitted for inpatient treatment for surgery, pain control, PT, OT, prophylactic antibiotics, VTE prophylaxis, progressive ambulation and ADL's and discharge planning. The patient is planning to be discharged home with home health services

## 2018-10-18 ENCOUNTER — Encounter (HOSPITAL_COMMUNITY): Payer: Self-pay | Admitting: Certified Registered"

## 2018-10-18 ENCOUNTER — Encounter (HOSPITAL_COMMUNITY)
Admission: RE | Disposition: A | Discharge status: Home / Self Care | Payer: Self-pay | Source: Home / Self Care | Attending: Orthopaedic Surgery

## 2018-10-18 ENCOUNTER — Inpatient Hospital Stay (HOSPITAL_COMMUNITY)
Admission: RE | Admit: 2018-10-18 | Discharge: 2018-10-21 | DRG: 470 | Disposition: A | Discharge status: Home / Self Care | Payer: Medicare Other | Attending: Orthopaedic Surgery | Admitting: Orthopaedic Surgery

## 2018-10-18 ENCOUNTER — Inpatient Hospital Stay (HOSPITAL_COMMUNITY): Payer: Medicare Other | Admitting: Certified Registered"

## 2018-10-18 ENCOUNTER — Other Ambulatory Visit: Payer: Self-pay

## 2018-10-18 DIAGNOSIS — G8918 Other acute postprocedural pain: Secondary | ICD-10-CM | POA: Diagnosis not present

## 2018-10-18 DIAGNOSIS — Z7983 Long term (current) use of bisphosphonates: Secondary | ICD-10-CM | POA: Diagnosis not present

## 2018-10-18 DIAGNOSIS — Z79899 Other long term (current) drug therapy: Secondary | ICD-10-CM | POA: Diagnosis not present

## 2018-10-18 DIAGNOSIS — E039 Hypothyroidism, unspecified: Secondary | ICD-10-CM | POA: Diagnosis present

## 2018-10-18 DIAGNOSIS — I10 Essential (primary) hypertension: Secondary | ICD-10-CM | POA: Diagnosis present

## 2018-10-18 DIAGNOSIS — F418 Other specified anxiety disorders: Secondary | ICD-10-CM | POA: Diagnosis present

## 2018-10-18 DIAGNOSIS — M1711 Unilateral primary osteoarthritis, right knee: Secondary | ICD-10-CM | POA: Diagnosis present

## 2018-10-18 HISTORY — PX: TOTAL KNEE ARTHROPLASTY: SHX125

## 2018-10-18 SURGERY — ARTHROPLASTY, KNEE, TOTAL
Anesthesia: Monitor Anesthesia Care | Site: Knee | Laterality: Right

## 2018-10-18 MED ORDER — DIPHENHYDRAMINE HCL 12.5 MG/5ML PO ELIX: 12.5000 mg | ORAL_SOLUTION | ORAL | Status: DC | PRN

## 2018-10-18 MED ORDER — THYROID 60 MG PO TABS
60.0000 mg | ORAL_TABLET | Freq: Every day | ORAL | Status: DC
  Administered 2018-10-19 – 2018-10-21 (×3): 60 mg via ORAL
  Filled 2018-10-18 (×3): qty 1

## 2018-10-18 MED ORDER — KETOROLAC TROMETHAMINE 15 MG/ML IJ SOLN
7.5000 mg | Freq: Four times a day (QID) | INTRAMUSCULAR | Status: AC
  Administered 2018-10-18 – 2018-10-19 (×4): 7.5 mg via INTRAVENOUS
  Filled 2018-10-18 (×4): qty 1

## 2018-10-18 MED ORDER — CEFAZOLIN SODIUM-DEXTROSE 2-4 GM/100ML-% IV SOLN
2.0000 g | Freq: Four times a day (QID) | INTRAVENOUS | Status: AC
  Administered 2018-10-18 – 2018-10-19 (×2): 2 g via INTRAVENOUS
  Filled 2018-10-18 (×2): qty 100

## 2018-10-18 MED ORDER — SODIUM CHLORIDE 0.9 % IV SOLN
INTRAVENOUS | Status: DC | PRN
  Administered 2018-10-18: 15 ug/min via INTRAVENOUS

## 2018-10-18 MED ORDER — OXYCODONE HCL 5 MG/5ML PO SOLN: 5.0000 mg | Freq: Once | ORAL | Status: DC | PRN

## 2018-10-18 MED ORDER — PROPOFOL 500 MG/50ML IV EMUL
INTRAVENOUS | Status: DC | PRN
  Administered 2018-10-18: 100 ug/kg/min via INTRAVENOUS

## 2018-10-18 MED ORDER — OXYCODONE HCL 5 MG PO TABS: 5.0000 mg | ORAL_TABLET | Freq: Once | ORAL | Status: DC | PRN

## 2018-10-18 MED ORDER — LACTATED RINGERS IV SOLN
INTRAVENOUS | Status: DC
  Administered 2018-10-18 (×2): via INTRAVENOUS

## 2018-10-18 MED ORDER — METOCLOPRAMIDE HCL 5 MG/ML IJ SOLN: 5.0000 mg | Freq: Three times a day (TID) | INTRAMUSCULAR | Status: DC | PRN

## 2018-10-18 MED ORDER — PROPOFOL 10 MG/ML IV BOLUS
INTRAVENOUS | Status: AC
  Filled 2018-10-18: qty 20

## 2018-10-18 MED ORDER — CHLORHEXIDINE GLUCONATE 4 % EX LIQD: 60.0000 mL | Freq: Once | CUTANEOUS | Status: DC

## 2018-10-18 MED ORDER — ACETAMINOPHEN 325 MG PO TABS: 325.0000 mg | ORAL_TABLET | Freq: Four times a day (QID) | ORAL | Status: DC | PRN

## 2018-10-18 MED ORDER — FENTANYL CITRATE (PF) 250 MCG/5ML IJ SOLN
INTRAMUSCULAR | Status: AC
  Filled 2018-10-18: qty 5

## 2018-10-18 MED ORDER — ONDANSETRON HCL 4 MG/2ML IJ SOLN
4.0000 mg | Freq: Four times a day (QID) | INTRAMUSCULAR | Status: DC | PRN
  Administered 2018-10-19: 4 mg via INTRAVENOUS
  Filled 2018-10-18: qty 2

## 2018-10-18 MED ORDER — ASPIRIN EC 325 MG PO TBEC
325.0000 mg | DELAYED_RELEASE_TABLET | Freq: Two times a day (BID) | ORAL | Status: DC
  Administered 2018-10-19 – 2018-10-21 (×5): 325 mg via ORAL
  Filled 2018-10-18 (×5): qty 1

## 2018-10-18 MED ORDER — 0.9 % SODIUM CHLORIDE (POUR BTL) OPTIME
TOPICAL | Status: DC | PRN
  Administered 2018-10-18: 1000 mL

## 2018-10-18 MED ORDER — LACTATED RINGERS IV SOLN: INTRAVENOUS | Status: DC

## 2018-10-18 MED ORDER — PROPOFOL 1000 MG/100ML IV EMUL
INTRAVENOUS | Status: AC
  Filled 2018-10-18: qty 100

## 2018-10-18 MED ORDER — BUPIVACAINE-EPINEPHRINE (PF) 0.25% -1:200000 IJ SOLN
INTRAMUSCULAR | Status: AC
  Filled 2018-10-18: qty 30

## 2018-10-18 MED ORDER — CHOLESTYRAMINE 4 G PO PACK
4.0000 g | PACK | Freq: Two times a day (BID) | ORAL | Status: DC
  Administered 2018-10-19: 4 g via ORAL
  Filled 2018-10-18 (×3): qty 1

## 2018-10-18 MED ORDER — BUPIVACAINE-EPINEPHRINE 0.25% -1:200000 IJ SOLN
INTRAMUSCULAR | Status: DC | PRN
  Administered 2018-10-18: 30 mL

## 2018-10-18 MED ORDER — ONDANSETRON HCL 4 MG PO TABS: 4.0000 mg | ORAL_TABLET | Freq: Four times a day (QID) | ORAL | Status: DC | PRN

## 2018-10-18 MED ORDER — MORPHINE SULFATE (PF) 2 MG/ML IV SOLN: 0.5000 mg | INTRAVENOUS | Status: DC | PRN

## 2018-10-18 MED ORDER — ZOLPIDEM TARTRATE 5 MG PO TABS
5.0000 mg | ORAL_TABLET | Freq: Every evening | ORAL | Status: DC | PRN
  Administered 2018-10-18 – 2018-10-20 (×3): 5 mg via ORAL
  Filled 2018-10-18 (×3): qty 1

## 2018-10-18 MED ORDER — METHOCARBAMOL 1000 MG/10ML IJ SOLN
500.0000 mg | Freq: Four times a day (QID) | INTRAVENOUS | Status: DC | PRN
  Filled 2018-10-18: qty 5

## 2018-10-18 MED ORDER — PROPOFOL 10 MG/ML IV BOLUS
INTRAVENOUS | Status: DC | PRN
  Administered 2018-10-18 (×3): 10 mg via INTRAVENOUS

## 2018-10-18 MED ORDER — ACETAMINOPHEN 500 MG PO TABS: 1000.0000 mg | ORAL_TABLET | Freq: Once | ORAL | Status: DC | PRN

## 2018-10-18 MED ORDER — MENTHOL 3 MG MT LOZG: 1.0000 | LOZENGE | OROMUCOSAL | Status: DC | PRN

## 2018-10-18 MED ORDER — ACETAMINOPHEN 500 MG PO TABS
500.0000 mg | ORAL_TABLET | Freq: Four times a day (QID) | ORAL | Status: AC
  Administered 2018-10-18 – 2018-10-19 (×3): 500 mg via ORAL
  Filled 2018-10-18 (×3): qty 1

## 2018-10-18 MED ORDER — DOCUSATE SODIUM 100 MG PO CAPS
100.0000 mg | ORAL_CAPSULE | Freq: Two times a day (BID) | ORAL | Status: DC
  Administered 2018-10-21: 100 mg via ORAL
  Filled 2018-10-18 (×4): qty 1

## 2018-10-18 MED ORDER — FENTANYL CITRATE (PF) 100 MCG/2ML IJ SOLN: 25.0000 ug | INTRAMUSCULAR | Status: DC | PRN

## 2018-10-18 MED ORDER — FENTANYL CITRATE (PF) 100 MCG/2ML IJ SOLN
INTRAMUSCULAR | Status: AC
  Administered 2018-10-18: 50 ug
  Filled 2018-10-18: qty 2

## 2018-10-18 MED ORDER — CEFAZOLIN SODIUM-DEXTROSE 2-4 GM/100ML-% IV SOLN
2.0000 g | INTRAVENOUS | Status: AC
  Administered 2018-10-18: 2 g via INTRAVENOUS
  Filled 2018-10-18: qty 100

## 2018-10-18 MED ORDER — ACETAMINOPHEN 10 MG/ML IV SOLN: 1000.0000 mg | Freq: Once | INTRAVENOUS | Status: DC | PRN

## 2018-10-18 MED ORDER — TRANEXAMIC ACID-NACL 1000-0.7 MG/100ML-% IV SOLN
1000.0000 mg | Freq: Once | INTRAVENOUS | Status: AC
  Administered 2018-10-18: 1000 mg via INTRAVENOUS
  Filled 2018-10-18 (×2): qty 100

## 2018-10-18 MED ORDER — MIDAZOLAM HCL 2 MG/2ML IJ SOLN
INTRAMUSCULAR | Status: AC
  Filled 2018-10-18: qty 2

## 2018-10-18 MED ORDER — ACETAMINOPHEN 160 MG/5ML PO SOLN: 1000.0000 mg | Freq: Once | ORAL | Status: DC | PRN

## 2018-10-18 MED ORDER — METOCLOPRAMIDE HCL 5 MG PO TABS: 5.0000 mg | ORAL_TABLET | Freq: Three times a day (TID) | ORAL | Status: DC | PRN

## 2018-10-18 MED ORDER — SODIUM CHLORIDE 0.9 % IR SOLN
Status: DC | PRN
  Administered 2018-10-18: 3000 mL

## 2018-10-18 MED ORDER — METHOCARBAMOL 500 MG PO TABS
500.0000 mg | ORAL_TABLET | Freq: Four times a day (QID) | ORAL | Status: DC | PRN
  Administered 2018-10-19: 500 mg via ORAL
  Filled 2018-10-18: qty 1

## 2018-10-18 MED ORDER — ESCITALOPRAM OXALATE 10 MG PO TABS
10.0000 mg | ORAL_TABLET | Freq: Every day | ORAL | Status: DC
  Administered 2018-10-19 – 2018-10-21 (×3): 10 mg via ORAL
  Filled 2018-10-18 (×3): qty 1

## 2018-10-18 MED ORDER — ALUM & MAG HYDROXIDE-SIMETH 200-200-20 MG/5ML PO SUSP: 30.0000 mL | ORAL | Status: DC | PRN

## 2018-10-18 MED ORDER — SODIUM CHLORIDE 0.9% FLUSH
INTRAVENOUS | Status: DC | PRN
  Administered 2018-10-18: 20 mL

## 2018-10-18 MED ORDER — PHENOL 1.4 % MT LIQD
1.0000 | OROMUCOSAL | Status: DC | PRN
  Administered 2018-10-19: 1 via OROMUCOSAL
  Filled 2018-10-18: qty 177

## 2018-10-18 MED ORDER — HYDROCODONE-ACETAMINOPHEN 5-325 MG PO TABS
1.0000 | ORAL_TABLET | ORAL | Status: DC | PRN
  Administered 2018-10-19: 1 via ORAL
  Administered 2018-10-19 – 2018-10-20 (×6): 2 via ORAL
  Administered 2018-10-21: 1 via ORAL
  Filled 2018-10-18 (×2): qty 2
  Filled 2018-10-18: qty 1
  Filled 2018-10-18 (×4): qty 2
  Filled 2018-10-18: qty 1

## 2018-10-18 MED ORDER — LORAZEPAM 0.5 MG PO TABS
0.5000 mg | ORAL_TABLET | Freq: Every day | ORAL | Status: DC
  Administered 2018-10-18 – 2018-10-20 (×3): 0.5 mg via ORAL
  Filled 2018-10-18 (×3): qty 1

## 2018-10-18 MED ORDER — BISACODYL 5 MG PO TBEC: 5.0000 mg | DELAYED_RELEASE_TABLET | Freq: Every day | ORAL | Status: DC | PRN

## 2018-10-18 MED ORDER — ROPIVACAINE HCL 7.5 MG/ML IJ SOLN
INTRAMUSCULAR | Status: DC | PRN
  Administered 2018-10-18: 20 mL via PERINEURAL

## 2018-10-18 MED ORDER — LACTATED RINGERS IV SOLN
INTRAVENOUS | Status: DC
  Administered 2018-10-19 (×2): via INTRAVENOUS

## 2018-10-18 SURGICAL SUPPLY — 63 items
ATTUNE MED DOME PAT 38 KNEE (Knees) ×1 IMPLANT
ATTUNE MED DOME PAT 38MM KNEE (Knees) ×1 IMPLANT
ATTUNE PS FEM RT SZ 5 CEM KNEE (Femur) ×2 IMPLANT
ATTUNE PSRP INSR SZ5 6 KNEE (Insert) ×1 IMPLANT
ATTUNE PSRP INSR SZ5 6MM KNEE (Insert) ×1 IMPLANT
BAG DECANTER FOR FLEXI CONT (MISCELLANEOUS) IMPLANT
BANDAGE ESMARK 6X9 LF (GAUZE/BANDAGES/DRESSINGS) ×1 IMPLANT
BASE TIBIAL ROT PLAT SZ 5 KNEE (Knees) IMPLANT
BLADE SAGITTAL 25.0X1.19X90 (BLADE) ×2 IMPLANT
BLADE SAGITTAL 25.0X1.19X90MM (BLADE) ×1
BLADE SAW SGTL 13.0X1.19X90.0M (BLADE) IMPLANT
BNDG CMPR 9X6 STRL LF SNTH (GAUZE/BANDAGES/DRESSINGS) ×1
BNDG CMPR MED 10X6 ELC LF (GAUZE/BANDAGES/DRESSINGS) ×1
BNDG CMPR MED 15X6 ELC VLCR LF (GAUZE/BANDAGES/DRESSINGS) ×1
BNDG ELASTIC 6X10 VLCR STRL LF (GAUZE/BANDAGES/DRESSINGS) ×3 IMPLANT
BNDG ELASTIC 6X15 VLCR STRL LF (GAUZE/BANDAGES/DRESSINGS) ×2 IMPLANT
BNDG ESMARK 6X9 LF (GAUZE/BANDAGES/DRESSINGS) ×3
BOWL SMART MIX CTS (DISPOSABLE) ×3 IMPLANT
BSPLAT TIB 5 CMNT ROT PLAT STR (Knees) ×1 IMPLANT
CEMENT HV SMART SET (Cement) ×6 IMPLANT
COVER SURGICAL LIGHT HANDLE (MISCELLANEOUS) ×3 IMPLANT
COVER WAND RF STERILE (DRAPES) ×3 IMPLANT
CUFF TOURNIQUET SINGLE 34IN LL (TOURNIQUET CUFF) ×3 IMPLANT
CUFF TOURNIQUET SINGLE 44IN (TOURNIQUET CUFF) IMPLANT
DECANTER SPIKE VIAL GLASS SM (MISCELLANEOUS) ×3 IMPLANT
DRAPE EXTREMITY T 121X128X90 (DISPOSABLE) ×3 IMPLANT
DRAPE HALF SHEET 40X57 (DRAPES) ×6 IMPLANT
DRAPE U-SHAPE 47X51 STRL (DRAPES) ×3 IMPLANT
DRSG AQUACEL AG ADV 3.5X10 (GAUZE/BANDAGES/DRESSINGS) ×3 IMPLANT
DURAPREP 26ML APPLICATOR (WOUND CARE) ×3 IMPLANT
ELECT REM PT RETURN 9FT ADLT (ELECTROSURGICAL) ×3
ELECTRODE REM PT RTRN 9FT ADLT (ELECTROSURGICAL) ×1 IMPLANT
GLOVE BIO SURGEON STRL SZ8 (GLOVE) ×6 IMPLANT
GLOVE BIOGEL PI IND STRL 8 (GLOVE) ×2 IMPLANT
GLOVE BIOGEL PI INDICATOR 8 (GLOVE) ×4
GOWN STRL REUS W/ TWL LRG LVL3 (GOWN DISPOSABLE) ×1 IMPLANT
GOWN STRL REUS W/ TWL XL LVL3 (GOWN DISPOSABLE) ×2 IMPLANT
GOWN STRL REUS W/TWL LRG LVL3 (GOWN DISPOSABLE) ×3
GOWN STRL REUS W/TWL XL LVL3 (GOWN DISPOSABLE) ×6
HANDPIECE INTERPULSE COAX TIP (DISPOSABLE) ×3
HOOD PEEL AWAY FACE SHEILD DIS (HOOD) ×6 IMPLANT
KIT BASIN OR (CUSTOM PROCEDURE TRAY) ×3 IMPLANT
KIT TURNOVER KIT B (KITS) ×3 IMPLANT
MANIFOLD NEPTUNE II (INSTRUMENTS) ×3 IMPLANT
NDL HYPO 21X1 ECLIPSE (NEEDLE) ×1 IMPLANT
NEEDLE HYPO 21X1 ECLIPSE (NEEDLE) ×3 IMPLANT
NS IRRIG 1000ML POUR BTL (IV SOLUTION) ×3 IMPLANT
PACK TOTAL JOINT (CUSTOM PROCEDURE TRAY) ×3 IMPLANT
PAD ARMBOARD 7.5X6 YLW CONV (MISCELLANEOUS) ×6 IMPLANT
PIN STEINMAN FIXATION KNEE (PIN) ×2 IMPLANT
PIN THREADED HEADED SIGMA (PIN) ×2 IMPLANT
SET HNDPC FAN SPRY TIP SCT (DISPOSABLE) ×1 IMPLANT
SUT VIC AB 0 CT1 27 (SUTURE) ×3
SUT VIC AB 0 CT1 27XBRD ANBCTR (SUTURE) ×1 IMPLANT
SUT VIC AB 2-0 CT1 27 (SUTURE) ×3
SUT VIC AB 2-0 CT1 TAPERPNT 27 (SUTURE) ×1 IMPLANT
SUT VIC AB 3-0 FS2 27 (SUTURE) ×3 IMPLANT
SUT VLOC 180 0 24IN GS25 (SUTURE) ×3 IMPLANT
SYR 50ML LL SCALE MARK (SYRINGE) ×3 IMPLANT
TIBIAL BASE ROT PLAT SZ 5 KNEE (Knees) ×3 IMPLANT
TOWEL OR 17X24 6PK STRL BLUE (TOWEL DISPOSABLE) ×3 IMPLANT
TOWEL OR 17X26 10 PK STRL BLUE (TOWEL DISPOSABLE) ×3 IMPLANT
TRAY CATH 16FR W/PLASTIC CATH (SET/KITS/TRAYS/PACK) IMPLANT

## 2018-10-18 NOTE — Transfer of Care (Signed)
Immediate Anesthesia Transfer of Care Note  Patient: Tiffany Fox  Procedure(s) Performed: TOTAL KNEE ARTHROPLASTY (Right Knee)  Patient Location: PACU  Anesthesia Type:Spinal  Level of Consciousness: awake and oriented  Airway & Oxygen Therapy: Patient Spontanous Breathing  Post-op Assessment: Report given to RN  Post vital signs: Reviewed and stable  Last Vitals:  Vitals Value Taken Time  BP 130/84 10/18/2018  3:16 PM  Temp    Pulse 76 10/18/2018  3:16 PM  Resp 12 10/18/2018  3:16 PM  SpO2 97 % 10/18/2018  3:16 PM  Vitals shown include unvalidated device data.  Last Pain:  Vitals:   10/18/18 1028  TempSrc: Oral         Complications: No apparent anesthesia complications

## 2018-10-18 NOTE — Interval H&P Note (Signed)
History and Physical Interval Note:  10/18/2018 11:58 AM  Tiffany Fox  has presented today for surgery, with the diagnosis of RIGHT KNEE DEGENERATIVE JOINT DISEASE  The various methods of treatment have been discussed with the patient and family. After consideration of risks, benefits and other options for treatment, the patient has consented to  Procedure(s): TOTAL KNEE ARTHROPLASTY (Right) as a surgical intervention .  The patient's history has been reviewed, patient examined, no change in status, stable for surgery.  I have reviewed the patient's chart and labs.  Questions were answered to the patient's satisfaction.     Zemirah Krasinski G

## 2018-10-18 NOTE — Anesthesia Procedure Notes (Addendum)
Anesthesia Regional Block: Adductor canal block   Pre-Anesthetic Checklist: ,, timeout performed, Correct Patient, Correct Site, Correct Laterality, Correct Procedure, Correct Position, site marked, Risks and benefits discussed,  Surgical consent,  Pre-op evaluation,  At surgeon's request and post-op pain management  Laterality: Right and Lower  Prep: chloraprep       Needles:  Injection technique: Single-shot     Needle Length: 9cm  Needle Gauge: 22     Additional Needles: Arrow StimuQuik ECHO Echogenic Stimulating PNB Needle  Procedures:,,,, ultrasound used (permanent image in chart),,,,  Narrative:  Start time: 10/18/2018 11:53 AM End time: 10/18/2018 11:58 AM Injection made incrementally with aspirations every 5 mL.  Performed by: Personally  Anesthesiologist: Oleta Mouse, MD

## 2018-10-18 NOTE — Anesthesia Procedure Notes (Signed)
Procedure Name: MAC Date/Time: 10/18/2018 1:01 PM Performed by: Barrington Ellison, CRNA Pre-anesthesia Checklist: Patient identified, Emergency Drugs available, Suction available and Patient being monitored Patient Re-evaluated:Patient Re-evaluated prior to induction Oxygen Delivery Method: Simple face mask

## 2018-10-18 NOTE — Progress Notes (Signed)
Patient admitted to [id number], alert, oriented, coherent, not in any distress, oriented to unit and staff, vital signs stable, skin intact. Will endorse

## 2018-10-18 NOTE — Care Plan (Signed)
Ortho Bundle Case Management Note  Patient Details  Name: STEPHANNIE BRONER MRN: [id number] Date of Birth: [birth year]   SPoke with patient prior to surgery. Will discharge to home with family and friends to assist. Has equipment at home. Will have HHPT and transition to OPPT as planned.                     DME Arranged:    DME Agency:     HH Arranged:  PT HH Agency:  [facility]  Additional Comments: Please contact me with any questions of if this plan should need to change.  Ladell Heads,  [place] Specialist  [phone] 10/18/2018, 1:58 PM

## 2018-10-18 NOTE — Op Note (Signed)
PREOP DIAGNOSIS: DJD RIGHT KNEE POSTOP DIAGNOSIS: same PROCEDURE: RIGHT TKR ANESTHESIA: Spinal and MAC ATTENDING SURGEON: Marquise Lambson G ASSISTANT: Loni Dolly PA  INDICATIONS FOR PROCEDURE: Tiffany Fox is a 85 y.o. female who has struggled for a long time with pain due to degenerative arthritis of the right knee.  The patient has failed many conservative non-operative measures and at this point has pain which limits the ability to sleep and walk.  The patient is offered total knee replacement.  Informed operative consent was obtained after discussion of possible risks of anesthesia, infection, neurovascular injury, DVT, and death.  The importance of the post-operative rehabilitation protocol to optimize result was stressed extensively with the patient.  SUMMARY OF FINDINGS AND PROCEDURE:  Tiffany Fox was taken to the operative suite where under the above anesthesia a right knee replacement was performed.  There were advanced degenerative changes and the bone quality was poor.  We used the DePuy Attune system and placed size 5 femur, 5 tibia, 38 mm all polyethylene patella, and a size 6 mm spacer.  Loni Dolly PA-C assisted throughout and was invaluable to the completion of the case in that he helped retract and maintain exposure while I placed components.  He also helped close thereby minimizing OR time.  The patient was admitted for appropriate post-op care to include perioperative antibiotics and mechanical and pharmacologic measures for DVT prophylaxis.  DESCRIPTION OF PROCEDURE:  Tiffany Fox was taken to the operative suite where the above anesthesia was applied.  The patient was positioned supine and prepped and draped in normal sterile fashion.  An appropriate time out was performed.  After the administration of kefzol pre-op antibiotic the leg was elevated and exsanguinated and a tourniquet inflated. A standard longitudinal incision was made on the anterior knee.  Dissection  was carried down to the extensor mechanism.  All appropriate anti-infective measures were used including the pre-operative antibiotic, betadine impregnated drape, and closed hooded exhaust systems for each member of the surgical team.  A medial parapatellar incision was made in the extensor mechanism and the knee cap flipped and the knee flexed.  Some residual meniscal tissues were removed along with any remaining ACL/PCL tissue.  A guide was placed on the tibia and a flat cut was made on it's superior surface.  An intramedullary guide was placed in the femur and was utilized to make anterior and posterior cuts creating an appropriate flexion gap.  A second intramedullary guide was placed in the femur to make a distal cut properly balancing the knee with an extension gap equal to the flexion gap.  The three bones sized to the above mentioned sizes and the appropriate guides were placed and utilized.  A trial reduction was done and the knee easily came to full extension and the patella tracked well on flexion.  The trial components were removed and all bones were cleaned with pulsatile lavage and then dried thoroughly.  Cement was mixed and was pressurized onto the bones followed by placement of the aforementioned components.  Excess cement was trimmed and pressure was held on the components until the cement had hardened.  The tourniquet was deflated and a small amount of bleeding was controlled with cautery and pressure.  The knee was irrigated thoroughly.  The extensor mechanism was re-approximated with V-loc suture in running fashion.  The knee was flexed and the repair was solid.  The subcutaneous tissues were re-approximated with #0 and #2-0 vicryl and the skin closed with a  subcuticular stitch and steristrips.  A sterile dressing was applied.  Intraoperative fluids, EBL, and tourniquet time can be obtained from anesthesia records.  DISPOSITION:  The patient was taken to recovery room in stable condition and  admitted for appropriate post-op care to include peri-operative antibiotic and DVT prophylaxis with mechanical and pharmacologic measures.  Jalexis Breed G 10/18/2018, 2:34 PM

## 2018-10-18 NOTE — Anesthesia Preprocedure Evaluation (Signed)
Anesthesia Evaluation  Patient identified by MRN, date of birth, ID band Patient awake    Reviewed: Allergy & Precautions, NPO status , Patient's Chart, lab work & pertinent test results  History of Anesthesia Complications Negative for: history of anesthetic complications  Airway Mallampati: II  TM Distance: >3 FB Neck ROM: Full    Dental  (+) Teeth Intact   Pulmonary neg pulmonary ROS,    breath sounds clear to auscultation       Cardiovascular hypertension,  Rhythm:Regular     Neuro/Psych PSYCHIATRIC DISORDERS Anxiety Depression negative neurological ROS     GI/Hepatic negative GI ROS, Neg liver ROS,   Endo/Other  Hypothyroidism   Renal/GU negative Renal ROS     Musculoskeletal  (+) Arthritis ,   Abdominal   Peds  Hematology   Anesthesia Other Findings   Reproductive/Obstetrics                             Anesthesia Physical Anesthesia Plan  ASA: II  Anesthesia Plan: MAC, Regional and Spinal   Post-op Pain Management:  Regional for Post-op pain   Induction:   PONV Risk Score and Plan: 2 and Treatment may vary due to age or medical condition and Propofol infusion  Airway Management Planned: Nasal Cannula  Additional Equipment: None  Intra-op Plan:   Post-operative Plan:   Informed Consent: I have reviewed the patients History and Physical, chart, labs and discussed the procedure including the risks, benefits and alternatives for the proposed anesthesia with the patient or authorized representative who has indicated his/her understanding and acceptance.     Dental advisory given  Plan Discussed with: CRNA and Surgeon  Anesthesia Plan Comments:         Anesthesia Quick Evaluation

## 2018-10-19 ENCOUNTER — Encounter (HOSPITAL_COMMUNITY): Payer: Self-pay | Admitting: Orthopaedic Surgery

## 2018-10-19 DIAGNOSIS — M1711 Unilateral primary osteoarthritis, right knee: Principal | ICD-10-CM

## 2018-10-19 DIAGNOSIS — E039 Hypothyroidism, unspecified: Secondary | ICD-10-CM

## 2018-10-19 DIAGNOSIS — I10 Essential (primary) hypertension: Secondary | ICD-10-CM

## 2018-10-19 LAB — CBC
HCT: 34.7 % — ABNORMAL LOW (ref 36.0–46.0)
Hemoglobin: 11.4 g/dL — ABNORMAL LOW (ref 12.0–15.0)
MCH: 31.9 pg (ref 26.0–34.0)
MCHC: 32.9 g/dL (ref 30.0–36.0)
MCV: 97.2 fL (ref 80.0–100.0)
Platelets: 150 K/uL (ref 150–400)
RBC: 3.57 MIL/uL — ABNORMAL LOW (ref 3.87–5.11)
RDW: 13.7 % (ref 11.5–15.5)
WBC: 3.7 K/uL — ABNORMAL LOW (ref 4.0–10.5)
nRBC: 0 % (ref 0.0–0.2)

## 2018-10-19 LAB — BASIC METABOLIC PANEL WITH GFR
Anion gap: 11 (ref 5–15)
BUN: 7 mg/dL — ABNORMAL LOW (ref 8–23)
CO2: 26 mmol/L (ref 22–32)
Calcium: 8.6 mg/dL — ABNORMAL LOW (ref 8.9–10.3)
Chloride: 98 mmol/L (ref 98–111)
Creatinine, Ser: 0.77 mg/dL (ref 0.44–1.00)
GFR calc Af Amer: >60 mL/min
GFR calc non Af Amer: >60 mL/min
Glucose, Bld: 106 mg/dL — ABNORMAL HIGH (ref 70–99)
Potassium: 3.7 mmol/L (ref 3.5–5.1)
Sodium: 135 mmol/L (ref 135–145)

## 2018-10-19 MED ORDER — HYDRALAZINE HCL 20 MG/ML IJ SOLN: 5.0000 mg | INTRAMUSCULAR | Status: DC | PRN

## 2018-10-19 MED ORDER — HYDRALAZINE HCL 20 MG/ML IJ SOLN: 10.0000 mg | INTRAMUSCULAR | Status: DC | PRN

## 2018-10-19 MED ORDER — LACTATED RINGERS IV BOLUS: 500.0000 mL | Freq: Once | INTRAVENOUS | Status: DC

## 2018-10-19 MED ORDER — AMLODIPINE BESYLATE 10 MG PO TABS
10.0000 mg | ORAL_TABLET | Freq: Every day | ORAL | Status: DC
  Administered 2018-10-19 – 2018-10-21 (×3): 10 mg via ORAL
  Filled 2018-10-19 (×3): qty 1

## 2018-10-19 MED ORDER — PANTOPRAZOLE SODIUM 40 MG PO TBEC
40.0000 mg | DELAYED_RELEASE_TABLET | Freq: Every day | ORAL | Status: DC
  Administered 2018-10-20 – 2018-10-21 (×2): 40 mg via ORAL
  Filled 2018-10-19 (×2): qty 1

## 2018-10-19 MED ORDER — POLYETHYLENE GLYCOL 3350 17 G PO PACK: 17.0000 g | PACK | Freq: Every day | ORAL | Status: DC | PRN

## 2018-10-19 NOTE — Care Plan (Signed)
Patient seen on rounds this am. She is doing well. Will have therapy today, tomorrow and twice on Friday prior to discharge. Her plan is home with her daughter to assist and HHPT. She has friends that will help with driving and other needs. Will continue to follow to determine if plan needs to change. This was discussed with patient and Dr Rhona Raider prior to admission.   Ladell Heads, [place]

## 2018-10-19 NOTE — Consult Note (Signed)
Patient Demographics  Tiffany Fox, is a 85 y.o. female   MRN: [id number]   DOB - [birth year]  Admit Date - 10/18/2018    Outpatient Primary MD for the patient is Rankins, Bill Salinas, MD  Consult requested in the Hospital by Melrose Nakayama, MD, On 10/19/2018    Reason for consult: hypertension   With History of -  Past Medical History:  Diagnosis Date  . Anxiety   . Arthritis   . Depression   . Hypertension   . Hypothyroidism   . Leukocytopenia       Past Surgical History:  Procedure Laterality Date  . APPENDECTOMY  01-01-11  . EYE SURGERY Bilateral 2011-01-01  . FRACTURE SURGERY    . HIP FRACTURE SURGERY Left 05.2011  . JOINT REPLACEMENT    . TOTAL KNEE ARTHROPLASTY Right 10/18/2018  . TOTAL KNEE ARTHROPLASTY Right 10/18/2018   Procedure: TOTAL KNEE ARTHROPLASTY;  Surgeon: Melrose Nakayama, MD;  Location: [facility];  Service: Orthopedics;  Laterality: Right;    in for   No chief complaint on file.    HPI  Tiffany Fox  is a 85 y.o. female, with past medical history of depression/anxiety, leukopenia, hypothyroidism, essential hypertension(has been on medication in the past intermittently) was admitted for total right knee replacement, for right knee osteoarthritis, she tolerated the procedure well, but she was noted to have elevated blood pressure perioperatively, overall his pain has been controlled, patient reported history of hypertension in the past, diagnosed after son died from cancer in 2004-12-31, but reported blood pressure has been controlled after that of medications, but by reviewing old records at outpatient visits blood pressure appears to be uncontrolled, currently she denies any chest pain, shortness of breath, fever, chills, cough, she had one episode of vomiting this afternoon while in the bathroom, but she denies any abdominal pain or nausea preceding that, reports it did resolve,  she tolerated her supper, I was consulted for blood pressure management.    Review of Systems    In addition to the HPI above,  No Fever-chills, No Headache, No changes with Vision or hearing, No problems swallowing food or Liquids, No Chest pain, Cough or Shortness of Breath, No Abdominal pain, No Nausea , Bowel movements are regular, he did report one episode of vomiting today No Blood in stool or Urine, No dysuria, No new skin rashes or bruises, Reports right joint pain is controlled No new weakness, tingling, numbness in any extremity, No recent weight gain or loss, No polyuria, polydypsia or polyphagia, No significant Mental Stressors.  A full 10 point Review of Systems was done, except as stated above, all other Review of Systems were negative.   Social History Social History   Tobacco Use  . Smoking status: Never Smoker  . Smokeless tobacco: Never Used  Substance Use Topics  . Alcohol use: Yes    Alcohol/week: 7.0 standard drinks    Types: 7 Glasses of wine per week    Family History Reports family history of  hypertension in her diseased son, and daughter, and her father  Prior to Admission medications   Medication Sig Start Date End Date Taking? Authorizing Provider  cholestyramine (QUESTRAN) 4 g packet Take 4 g by mouth 2 (two) times daily.   Yes [provider]  escitalopram (LEXAPRO) 10 MG tablet Take 10 mg by mouth daily.   Yes [provider]  eszopiclone (LUNESTA) 2 MG TABS tablet Take 2 mg by mouth at bedtime. Take immediately before bedtime   Yes [provider]  LORazepam (ATIVAN) 0.5 MG tablet Take 0.5 mg by mouth at bedtime.   Yes [provider]  Multiple Vitamins-Minerals (HAIR/SKIN/NAILS PO) Take 1 tablet by mouth daily.   Yes [provider]  omega-3 acid ethyl esters (LOVAZA) 1 G capsule Take 1 g by mouth 2 (two) times daily.     Yes [provider]  thyroid (ARMOUR) 60 MG tablet Take 60 mg by  mouth daily before breakfast.    Yes [provider]    Anti-infectives (From admission, onward)   Start     Dose/Rate Route Frequency Ordered Stop   10/18/18 2000  ceFAZolin (ANCEF) IVPB 2g/100 mL premix     2 g 200 mL/hr over 30 Minutes Intravenous Every 6 hours 10/18/18 1820 10/19/18 0240   10/18/18 1030  ceFAZolin (ANCEF) IVPB 2g/100 mL premix     2 g 200 mL/hr over 30 Minutes Intravenous On call to O.R. 10/18/18 1016 10/18/18 1258      Scheduled Meds: . amLODipine  10 mg Oral Daily  . aspirin EC  325 mg Oral BID PC  . docusate sodium  100 mg Oral BID  . escitalopram  10 mg Oral Daily  . LORazepam  0.5 mg Oral QHS  . pantoprazole  40 mg Oral Daily  . thyroid  60 mg Oral QAC breakfast   Continuous Infusions: . lactated ringers 50 mL/hr at 10/19/18 0209  . methocarbamol (ROBAXIN) IV     PRN Meds:.acetaminophen, alum & mag hydroxide-simeth, bisacodyl, diphenhydrAMINE, hydrALAZINE, HYDROcodone-acetaminophen, menthol-cetylpyridinium **OR** phenol, methocarbamol **OR** methocarbamol (ROBAXIN) IV, metoCLOPramide **OR** metoCLOPramide (REGLAN) injection, morphine injection, ondansetron **OR** ondansetron (ZOFRAN) IV, polyethylene glycol, zolpidem  Allergies  Allergen Reactions  . Lisinopril Rash    Physical Exam  Vitals  Blood pressure (!) 189/80, pulse 67, temperature 98.1 F (36.7 C), temperature source Oral, resp. rate 18, SpO2 99 %.   1. General developed  female, laying in bed in no apparent distress  2. Normal affect and insight, Not Suicidal or Homicidal, Awake Alert, Oriented X 3.  3. No F.N deficits, ALL C.Nerves Intact, Strength 5/5 all 4 extremities, Sensation intact all 4 extremities, Plantars down going.  4. Ears and Eyes appear Normal, Conjunctivae clear, PERRLA. Moist Oral Mucosa.  5. Supple Neck, No JVD, No cervical lymphadenopathy appriciated, No Carotid Bruits.  6. Symmetrical Chest wall movement, Good air movement bilaterally, CTAB.  7.  RRR, No Gallops, Rubs or Murmurs, No Parasternal Heave.  8. Positive Bowel Sounds, Abdomen Soft, No tenderness, No organomegaly appriciated,No rebound -guarding or rigidity.  9.  No Cyanosis, Normal Skin Turgor, No Skin Rash or Bruise.  10. Good muscle tone, right lower extremity in Ace wrap  11. No Palpable Lymph Nodes in Neck or Axillae  Data Review  CBC Recent Labs  Lab 10/19/18 0257  WBC 3.7*  HGB 11.4*  HCT 34.7*  PLT 150  MCV 97.2  MCH 31.9  MCHC 32.9  RDW 13.7   ------------------------------------------------------------------------------------------------------------------  Chemistries  Recent Labs  Lab 10/19/18 0257  NA 135  K 3.7  CL 98  CO2 26  GLUCOSE 106*  BUN 7*  CREATININE 0.77  CALCIUM 8.6*   ------------------------------------------------------------------------------------------------------------------ estimated creatinine clearance is 45.9 mL/min (by C-G formula based on SCr of 0.77 mg/dL). ------------------------------------------------------------------------------------------------------------------ No results for input(s): TSH, T4TOTAL, T3FREE, THYROIDAB in the last 72 hours.  Invalid input(s): FREET3   Coagulation profile No results for input(s): INR, PROTIME in the last 168 hours. ------------------------------------------------------------------------------------------------------------------- No results for input(s): DDIMER in the last 72 hours. -------------------------------------------------------------------------------------------------------------------  Cardiac Enzymes No results for input(s): CKMB, TROPONINI, MYOGLOBIN in the last 168 hours.  Invalid input(s): CK ------------------------------------------------------------------------------------------------------------------ Invalid input(s):  POCBNP   ---------------------------------------------------------------------------------------------------------------  Urinalysis    Component Value Date/Time   COLORURINE YELLOW 10/11/2018 [place] 10/11/2018 1422   LABSPEC 1.016 10/11/2018 1422   PHURINE 5.0 10/11/2018 1422   GLUCOSEU NEGATIVE 10/11/2018 1422   HGBUR NEGATIVE 10/11/2018 1422   BILIRUBINUR NEGATIVE 10/11/2018 1422   KETONESUR NEGATIVE 10/11/2018 1422   PROTEINUR NEGATIVE 10/11/2018 1422   UROBILINOGEN 0.2 09/01/2011 2150   NITRITE NEGATIVE 10/11/2018 1422   LEUKOCYTESUR NEGATIVE 10/11/2018 1422     Imaging results:   No results found.   Assessment & Plan  Principal Problem:   Primary osteoarthritis of right knee   Essential hypertension -Patient blood pressure uncontrolled during hospital stay, she had history of hypertension in the past, ports it was related to stress(episode after her husband death, and her son's death), overall blood pressure is consistently elevated and uncontrolled during hospital stay, all her pain is controlled, so I do not think is contributing much to it, her blood pressure was high even was reviewed at her outpatient follow-ups. -I will start her amlodipine 10 mg oral daily, I have discussed with her, likely she will need to be discharged on it, and will monitor over the next 24 hours on PRN hydralazine, and will adjust dosing as needed.  Hypothyroidism -Continue with home meds  Depression/anxiety -Continue with home meds  Vomiting -Patient has 1 episode of vomiting today, currently resolved, tolerating her diet, denies any nausea or abdominal pain, abdominal exam is benign, she reports history of diarrhea for which she was started on cholestyramine, which I will stop, as I told her likely she will be more constipated during hospital stay due to lack of mobility and pain medications.  Right knee degenerative joint disease -Let us postelective total knee  arthroplasty, management per primary orthopedic team -Is on aspirin for DVT prophylaxis, I will start on Protonix, have discussed with her, she is to continue Protonix as long she is on aspirin.  History of leukopenia -at [facility], she is being followed by hematology Dr. Earlie Server as an outpatient  DVT Prophylaxis ASPIRIN 325 MG BID,  SCDs    Family Communication: Plan discussed with patient, no other family members at bedside   Thank you for the consult, we will follow the patient with you in the Hospital.   Phillips Climes M.D on 10/19/2018 at 6:41 PM  Between 7am to 7pm - Pager - [phone]  After 7pm go to [email or web address] - password [email or web address]   Thank you for the consult, we will follow the patient with you in the [facility]   Office  [phone]

## 2018-10-19 NOTE — Social Work (Signed)
CSW acknowledging consult for SNF placement. Will follow for therapy recommendations.   Baruc Tugwell, MSW, LCSWA [place] Clinical Social Work [phone]   

## 2018-10-19 NOTE — Care Management Note (Signed)
Case Management Note  Patient Details  Name: JELENA MALICOAT MRN: [id number] Date of Birth: 1934/01/05  Subjective/Objective:                    Action/Plan: Await PT recommendations   Expected Discharge Date:                  Expected Discharge Plan:     In-House Referral:     Discharge planning Services  CM Consult  Post Acute Care Choice:  Durable Medical Equipment, Home Health Choice offered to:     DME Arranged:    DME Agency:     HH Arranged:  PT [place]:  [facility]  Status of Service:  In process, will continue to follow  If discussed at Long Length of Stay Meetings, dates discussed:    Additional Comments:  Marilu Favre, RN 10/19/2018, 8:35 AM

## 2018-10-19 NOTE — Evaluation (Signed)
Physical Therapy Evaluation Patient Details Name: Tiffany Fox MRN: [id number] DOB: Jun 09, 1934 Today's Date: 10/19/2018   History of Present Illness  85yo female who failed conservative management of R knee pain, received R TKR 10/18/2018. PMH anxiety, HTN, hx hip fracture   Clinical Impression   Patient received in bed, very pleasant but anxious regarding mobility; R knee AROM in supine 6 degrees extension, 50 degrees flexion. Able to complete bed mobility with MinA to support painful R LE, and required ModA for sit to stand from EOB with RW, cues for safety and hand placement; able to maintain balance with MinA and no UE support to pull up briefs in standing. Toileted and performed seated pericare with S, then required ModA for sit to stand from standard height toilet, able to maintain standing balance with min guard at sink to organize personal toiletries and wash hands. Able to gait train approximately 51fx2 with RW, gait mechanics limited as per note below and limited by pain. She was left up in the chair with all needs met, chair alarm active and all other needs met this morning.     Follow Up Recommendations Follow surgeon's recommendation for DC plan and follow-up therapies(if transfers do not improve to at least min guard before DC, may need SNF )    Equipment Recommendations  Rolling walker with 5" wheels;3in1 (PT)    Recommendations for Other Services       Precautions / Restrictions Precautions Precautions: Fall Restrictions Weight Bearing Restrictions: Yes RLE Weight Bearing: Weight bearing as tolerated      Mobility  Bed Mobility Overal bed mobility: Needs Assistance Bed Mobility: Supine to Sit     Supine to sit: Min assist     General bed mobility comments: MinA to support R LE during mobility due to pain   Transfers Overall transfer level: Needs assistance Equipment used: Rolling walker (2 wheeled) Transfers: Sit to/from Stand Sit to Stand: Mod assist         General transfer comment: ModA and mod VC for sequencing and safety during sit to stand transfers, extended time   Ambulation/Gait Ambulation/Gait assistance: Min guard Gait Distance (Feet): 15 Feet(x2) Assistive device: Rolling walker (2 wheeled) Gait Pattern/deviations: Step-through pattern;Decreased step length - right;Decreased step length - left;Decreased stance time - right;Decreased stride length;Decreased dorsiflexion - right;Decreased weight shift to right;Drifts right/left;Trunk flexed;Antalgic Gait velocity: decreased    General Gait Details: small, short steps and antalgic gait pattern secondary to knee pain; once up with RW steady and able to maintain balance with min guard. Gait distance limited by pain   Stairs            Wheelchair Mobility    Modified Rankin (Stroke Patients Only)       Balance Overall balance assessment: Needs assistance;History of Falls Sitting-balance support: Bilateral upper extremity supported;Feet supported Sitting balance-Leahy Scale: Good     Standing balance support: Bilateral upper extremity supported;During functional activity Standing balance-Leahy Scale: Fair Standing balance comment: reliance on B UE support; able to pull up brief with no UE support and Min guard-MinA for balance                              Pertinent Vitals/Pain Pain Assessment: 0-10 Pain Score: 5  Pain Location: R knee  Pain Descriptors / Indicators: Aching;Sore Pain Intervention(s): Limited activity within patient's tolerance;Monitored during session;Premedicated before session    Home Living Family/patient expects to be discharged to::  Private residence Living Arrangements: Alone Available Help at Discharge: Family;Available PRN/intermittently Type of Home: House Home Access: Level entry     Home Layout: One level Home Equipment: Walker - 2 wheels;Other (comment);Grab bars - tub/shower Additional Comments: elevated toilet  seat     Prior Function Level of Independence: Independent with assistive device(s)               Hand Dominance        Extremity/Trunk Assessment   Upper Extremity Assessment Upper Extremity Assessment: Generalized weakness    Lower Extremity Assessment Lower Extremity Assessment: Generalized weakness    Cervical / Trunk Assessment Cervical / Trunk Assessment: Normal  Communication   Communication: No difficulties  Cognition Arousal/Alertness: Awake/alert Behavior During Therapy: WFL for tasks assessed/performed Overall Cognitive Status: Within Functional Limits for tasks assessed                                        General Comments      Exercises Total Joint Exercises Goniometric ROM: R knee AROM in supine: 6 degrees extension, 50 degrees flexion    Assessment/Plan    PT Assessment Patient needs continued PT services  PT Problem List Decreased strength;Decreased coordination;Pain;Decreased range of motion;Decreased activity tolerance;Decreased knowledge of use of DME;Decreased balance;Decreased safety awareness;Decreased mobility;Decreased knowledge of precautions       PT Treatment Interventions DME instruction;Therapeutic exercise;Gait training;Balance training;Manual techniques;Stair training;Neuromuscular re-education;Functional mobility training;Cognitive remediation;Therapeutic activities;Patient/family education    PT Goals (Current goals can be found in the Care Plan section)  Acute Rehab PT Goals Patient Stated Goal: go home, start PT  PT Goal Formulation: With patient Time For Goal Achievement: 11/02/18 Potential to Achieve Goals: Good    Frequency 7X/week   Barriers to discharge        Co-evaluation               AM-PAC PT "6 Clicks" Mobility  Outcome Measure Help needed turning from your back to your side while in a flat bed without using bedrails?: None Help needed moving from lying on your back to sitting on  the side of a flat bed without using bedrails?: A Little Help needed moving to and from a bed to a chair (including a wheelchair)?: A Lot Help needed standing up from a chair using your arms (e.g., wheelchair or bedside chair)?: A Lot Help needed to walk in hospital room?: A Little Help needed climbing 3-5 steps with a railing? : A Lot 6 Click Score: 16    End of Session Equipment Utilized During Treatment: Gait belt Activity Tolerance: Patient limited by pain;Patient tolerated treatment well Patient left: in chair;with call bell/phone within reach;with chair alarm set   PT Visit Diagnosis: Unsteadiness on feet (R26.81);Difficulty in walking, not elsewhere classified (R26.2);Muscle weakness (generalized) (M62.81);History of falling (Z91.81)    Time: 2010-0712 PT Time Calculation (min) (ACUTE ONLY): 34 min   Charges:   PT Evaluation $PT Eval Low Complexity: 1 Low PT Treatments $Gait Training: 8-22 mins        Deniece Ree PT, DPT, CBIS  Supplemental Physical Therapist [place]    Pager [phone] Acute Rehab Office [phone]

## 2018-10-19 NOTE — Progress Notes (Signed)
Subjective: 1 Day Post-Op Procedure(s) (LRB): TOTAL KNEE ARTHROPLASTY (Right)   Patient resting comfortably in bed. She has not gotten up with PT yet.  Activity level:  wbat Diet tolerance:  ok Voiding:  Foley out this morning Patient reports pain as mild.    Objective: Vital signs in last 24 hours: Temp:  [97.3 F (36.3 C)-98.6 F (37 C)] 98 F (36.7 C) (01/15 0506) Pulse Rate:  [59-88] 80 (01/15 0506) Resp:  [12-20] 12 (01/15 0506) BP: (130-199)/(63-88) 140/72 (01/15 0506) SpO2:  [96 %-100 %] 97 % (01/15 0506)  Labs: Recent Labs    10/19/18 0257  HGB 11.4*   Recent Labs    10/19/18 0257  WBC 3.7*  RBC 3.57*  HCT 34.7*  PLT 150   Recent Labs    10/19/18 0257  NA 135  K 3.7  CL 98  CO2 26  BUN 7*  CREATININE 0.77  GLUCOSE 106*  CALCIUM 8.6*   No results for input(s): LABPT, INR in the last 72 hours.  Physical Exam:  Neurologically intact ABD soft Neurovascular intact Sensation intact distally Intact pulses distally Dorsiflexion/Plantar flexion intact Incision: dressing C/D/I and no drainage No cellulitis present Compartment soft  Assessment/Plan:  1 Day Post-Op Procedure(s) (LRB): TOTAL KNEE ARTHROPLASTY (Right) Advance diet Up with therapy D/C IV fluids Discharge home with home health probably Friday morning as that is when she will have family to help. Continue on ASA 325mg  BID x 2 weeks post op for DVT prevention. Follow up in office 2 weeks post op. Anticipated LOS equal to or greater than 2 midnights due to - Age 83 and older with one or more of the following:  - Obesity  - Expected need for hospital services (PT, OT, Nursing) required for safe  discharge  - Anticipated need for postoperative skilled nursing care or inpatient rehab  - Active co-morbidities: None OR   - Unanticipated findings during/Post Surgery: Slow post-op progression: GI, pain control, mobility  - Patient is a high risk of re-admission due to: None  Everado Pillsbury,  Larwance Sachs 10/19/2018, 7:36 AM

## 2018-10-19 NOTE — Progress Notes (Addendum)
Pt's BP 182/79. Dr. Rhona Raider paged. Awaiting response.  1613- Paged for Dr. Rhona Raider or his PA Mitzi Hansen.  1717- Page to confirm order of a bolus- Dr. Rhona Raider stated they would contact medicine.

## 2018-10-19 NOTE — Progress Notes (Signed)
Physical Therapy Treatment Patient Details Name: Tiffany Fox MRN: [id number] DOB: 16-Dec-1933 Today's Date: 10/19/2018    History of Present Illness 85yo female who failed conservative management of R knee pain, received R TKR 10/18/2018. PMH anxiety, HTN, hx hip fracture     PT Comments    Patient received in bed, pleasant and willing to participate in PT however reporting feeling nauseated and that she had vomited with nurse tech in bathroom earlier. Verbally reviewed and practiced supine TKR exercises and educated patient on appropriate number of reps/sets/frequency. Able to then perform bed mobility with min guard, functional transfers with min guard and rocking/multiple attempts with RW, gait approximately 138f with RW and min guard with cues for heel-toe pattern and safe use of device. She was left in bed with all needs met, bed alarm active. Mobility and tolerance to gait overall much improved this afternoon.    Follow Up Recommendations  Follow surgeon's recommendation for DC plan and follow-up therapies     Equipment Recommendations  Rolling walker with 5" wheels;3in1 (PT)    Recommendations for Other Services       Precautions / Restrictions Precautions Precautions: Fall Restrictions Weight Bearing Restrictions: Yes RLE Weight Bearing: Weight bearing as tolerated    Mobility  Bed Mobility Overal bed mobility: Needs Assistance Bed Mobility: Supine to Sit;Sit to Supine     Supine to sit: Min guard Sit to supine: Min guard   General bed mobility comments: Min guard for all bed mobility, overall mobility greatly improving this afternoon   Transfers Overall transfer level: Needs assistance Equipment used: Rolling walker (2 wheeled) Transfers: Sit to/from Stand Sit to Stand: Min guard         General transfer comment: Min guard and rocking/multiple attempts but able to come to full stand with less assist this afternoon, VC for hand placement and safety    Ambulation/Gait Ambulation/Gait assistance: Min guard Gait Distance (Feet): 150 Feet Assistive device: Rolling walker (2 wheeled) Gait Pattern/deviations: Step-through pattern;Decreased step length - right;Decreased step length - left;Decreased stance time - right;Decreased stride length;Decreased dorsiflexion - right;Decreased weight shift to right;Drifts right/left;Trunk flexed;Antalgic Gait velocity: decreased    General Gait Details: improved gait pattern with larger reciprocal steps, cues provided for heel-toe pattern; generally steady with RW, gait tolerance much improved this afternoon as well    Stairs             Wheelchair Mobility    Modified Rankin (Stroke Patients Only)       Balance Overall balance assessment: Needs assistance;History of Falls Sitting-balance support: Bilateral upper extremity supported;Feet supported Sitting balance-Leahy Scale: Good     Standing balance support: Bilateral upper extremity supported;During functional activity Standing balance-Leahy Scale: Fair Standing balance comment: B UE support on RW for safety                             Cognition Arousal/Alertness: Awake/alert Behavior During Therapy: WFL for tasks assessed/performed Overall Cognitive Status: Within Functional Limits for tasks assessed                                        Exercises Total Joint Exercises Ankle Circles/Pumps: Supine;10 reps Quad Sets: Right;10 reps Towel Squeeze: Both;10 reps Short Arc Quad: Right;10 reps Heel Slides: Right;10 reps Hip ABduction/ADduction: Right;10 reps Straight Leg Raises: Right;10 reps  General Comments        Pertinent Vitals/Pain Pain Assessment: Faces Faces Pain Scale: Hurts little more Pain Location: R knee  Pain Descriptors / Indicators: Aching;Sore Pain Intervention(s): Limited activity within patient's tolerance;Monitored during session    Home Living                       Prior Function            PT Goals (current goals can now be found in the care plan section) Acute Rehab PT Goals Patient Stated Goal: go home, start PT  PT Goal Formulation: With patient Time For Goal Achievement: 11/02/18 Potential to Achieve Goals: Good Progress towards PT goals: Progressing toward goals    Frequency    7X/week      PT Plan Current plan remains appropriate    Co-evaluation              AM-PAC PT "6 Clicks" Mobility   Outcome Measure  Help needed turning from your back to your side while in a flat bed without using bedrails?: None Help needed moving from lying on your back to sitting on the side of a flat bed without using bedrails?: A Little Help needed moving to and from a bed to a chair (including a wheelchair)?: A Little Help needed standing up from a chair using your arms (e.g., wheelchair or bedside chair)?: A Little Help needed to walk in hospital room?: A Little Help needed climbing 3-5 steps with a railing? : A Lot 6 Click Score: 18    End of Session Equipment Utilized During Treatment: Gait belt Activity Tolerance: Patient tolerated treatment well Patient left: in bed;with call bell/phone within reach;with bed alarm set   PT Visit Diagnosis: Unsteadiness on feet (R26.81);Difficulty in walking, not elsewhere classified (R26.2);Muscle weakness (generalized) (M62.81);History of falling (Z91.81)     Time: 1421-1450 PT Time Calculation (min) (ACUTE ONLY): 29 min  Charges:  $Gait Training: 8-22 mins $Therapeutic Exercise: 8-22 mins                     Deniece Ree PT, DPT, CBIS  Supplemental Physical Therapist [place]    Pager [phone] Acute Rehab Office [phone]

## 2018-10-20 LAB — CBC
HCT: 32.7 % — ABNORMAL LOW (ref 36.0–46.0)
Hemoglobin: 11.2 g/dL — ABNORMAL LOW (ref 12.0–15.0)
MCH: 32.9 pg (ref 26.0–34.0)
MCHC: 34.3 g/dL (ref 30.0–36.0)
MCV: 96.2 fL (ref 80.0–100.0)
Platelets: 146 10*3/uL — ABNORMAL LOW (ref 150–400)
RBC: 3.4 MIL/uL — AB (ref 3.87–5.11)
RDW: 13.6 % (ref 11.5–15.5)
WBC: 3.4 10*3/uL — ABNORMAL LOW (ref 4.0–10.5)
nRBC: 0 % (ref 0.0–0.2)

## 2018-10-20 NOTE — Progress Notes (Signed)
Subjective: 2 Days Post-Op Procedure(s) (LRB): TOTAL KNEE ARTHROPLASTY (Right)  Patient is feeling better this morning. She did try to get up on her own to go to the bathroom during the night and slipped down on to the floor. She states that she was trying to [place] e the IV pole to brace herself and didn't realize it had wheels on it. She has no pain or complaints since this incident. She also states that she knows she should of called for help and that she wont try to get up on her own again.  Activity level:  wbat Diet tolerance:  ok Voiding:  ok Patient reports pain as mild.    Objective: Vital signs in last 24 hours: Temp:  [98.1 F (36.7 C)-98.3 F (36.8 C)] 98.2 F (36.8 C) (01/16 0437) Pulse Rate:  [67-88] 71 (01/16 0437) Resp:  [16-20] 18 (01/16 0437) BP: (148-189)/(66-89) 148/66 (01/16 0437) SpO2:  [95 %-100 %] 95 % (01/16 0437)  Labs: Recent Labs    10/19/18 0257 10/20/18 0303  HGB 11.4* 11.2*   Recent Labs    10/19/18 0257 10/20/18 0303  WBC 3.7* 3.4*  RBC 3.57* 3.40*  HCT 34.7* 32.7*  PLT 150 146*   Recent Labs    10/19/18 0257  NA 135  K 3.7  CL 98  CO2 26  BUN 7*  CREATININE 0.77  GLUCOSE 106*  CALCIUM 8.6*   No results for input(s): LABPT, INR in the last 72 hours.  Physical Exam:  Neurologically intact ABD soft Neurovascular intact Sensation intact distally Intact pulses distally Dorsiflexion/Plantar flexion intact Incision: dressing C/D/I and no drainage No cellulitis present Compartment soft  Assessment/Plan:  2 Days Post-Op Procedure(s) (LRB): TOTAL KNEE ARTHROPLASTY (Right) Advance diet Up with therapy Plan for discharge tomorrow Discharge home with home health if doing well and cleared by PT and Medicine team. We greatly appreciate the medicine teams input on her BP.  Continue on ASA 325mg  BID x 2 weeks post op. Follow up in office 2 weeks post op.  Anticipated LOS equal to or greater than 2 midnights due to - Age 85 and  older with one or more of the following:  - Obesity  - Expected need for hospital services (PT, OT, Nursing) required for safe  discharge  - Anticipated need for postoperative skilled nursing care or inpatient rehab  - Active co-morbidities: None OR   - Unanticipated findings during/Post Surgery: Slow post-op progression: GI, pain control, mobility  - Patient is a high risk of re-admission due to: None  [place] 10/20/2018, 6:59 AM

## 2018-10-20 NOTE — Clinical Social Work Note (Signed)
Clinical Social Work Assessment  Patient Details  Name: Tiffany Fox MRN: [id number] Date of Birth: Mar 07, 1934  Date of referral:  10/20/18               Reason for consult:  Discharge Planning                Permission sought to share information with:  Family Supports, Customer service manager Permission granted to share information::  Yes, Verbal Permission Granted  Name::     Designer, fashion/clothing::  SNFs  Relationship::  daughter  Contact Information:  [phone]  Housing/Transportation Living arrangements for the past 2 months:  [place] of Information:  Patient Patient Interpreter Needed:  None Criminal Activity/Legal Involvement Pertinent to Current Situation/Hospitalization:  No - Comment as needed Significant Relationships:  Adult Children, Community Support Lives with:  Self Do you feel safe going back to the place where you live?  Yes Need for family participation in patient care:  Yes (Comment)  Care giving concerns:  Pt lives at home alone, she has a daughter coming to stay with her post discharge. Pt understands difference between home health and rehab, prefers [facility] at this time.   Social Worker assessment / plan:  CSW spoke with pt at bedside. Introduced self, role, and reason for visit. Pt alert and pleasant, states she is still tired post op. Pt states understanding of recommendations and that she has the option to d/c to SNF or home with home health. Pt states to this Probation officer that her physician encouraged her to go home. CSW let pt know that it is her choice and that she needs to make a decision based on her comfort level and assistance.   CSW overviewed the SNF referral process with pt, she still remains interested in [facility]. CSW alerted RNCM who will meet with pt.  Employment status:  Retired Forensic scientist:  Medicare PT Recommendations:  Home with [place] / Referral to community resources:  [place]  Patient/Family's Response to care:  Pt states understanding of CSW role and recommendations. Pt interested in [facility].  Patient/Family's Understanding of and Emotional Response to Diagnosis, Current Treatment, and Prognosis:  Pt states understanding of her diagnosis, current treatment and prognosis. Pt appears happy with care here at hospital and is pleasant and emotionally appropriate throughout assessment.  Emotional Assessment Appearance:  Appears stated age Attitude/Demeanor/Rapport:  Engaged Affect (typically observed):  Accepting, Adaptable Orientation:  Oriented to Self, Oriented to Place, Oriented to Situation, Oriented to  Time Alcohol / Substance use:  Not Applicable Psych involvement (Current and /or in the community):  No (Comment)  Discharge Needs  Concerns to be addressed:  Care Coordination, Discharge Planning Concerns Readmission within the last 30 days:  No Current discharge risk:  Physical Impairment, Lives alone Barriers to Discharge:  Continued Medical Work up   [employer], [place] 10/20/2018, 10:02 AM

## 2018-10-20 NOTE — Care Management Note (Signed)
Case Management Note  Patient Details  Name: Tiffany Fox MRN: [id number] Date of Birth: [birth year]  Subjective/Objective:                    Action/Plan:  Patient from home alone. Discussed PT recommendation SNF. Patient voices understanding , but wants to go home with home health. Patient aware home health is not in the home daily or for long periods of time. Patient states her daughter is going to stay with her through Monday 10/24/18 . If patient needs her daughter to stay longer , her daughter can. Patient states daughter has 80 days of vacation time.   Patient already has 3 in 1 , bedside commode and walker at home.   Referral given to and accepted by Joen Laura with [facility].   Expected Discharge Date:                  Expected Discharge Plan:  [place]  In-House Referral:  Clinical Social Work  Discharge planning Services  CM Consult  Post Acute Care Choice:  Durable Medical Equipment, Home Health Choice offered to:  Patient  DME Arranged:  N/A DME Agency:  NA  HH Arranged:  PT [place] Agency:  [facility] [employer]  Status of Service:  Completed, signed off  If discussed at [employer], dates discussed:    Additional Comments:  Marilu Favre, RN 10/20/2018, 10:51 AM

## 2018-10-20 NOTE — Progress Notes (Signed)
   10/20/18 0437  What Happened  Was fall witnessed? No  Was patient injured? No  Patient found in bathroom  Found by Staff-comment (pt sled to the floor and scooted to the [place])  Stated prior activity bathroom-unassisted  Follow Up  MD notified Joanell Rising, PA  Time MD notified 0500  Family notified Yes-comment (Called fried Juliann Pulse, as daughter no answering call)  Time family notified 0430  Additional tests No (PA said to order Pelvic xray if pt c/o of pain in that area)  Progress note created (see row info) Yes  Adult Fall Risk Assessment  Risk Factor Category (scoring not indicated) Fall has occurred during this admission (document High fall risk)  Age 85  Fall History: Fall within 6 months prior to admission 0  Elimination; Bowel and/or Urine Incontinence 0  Elimination; Bowel and/or Urine Urgency/Frequency 0  Medications: includes PCA/Opiates, Anti-convulsants, Anti-hypertensives, Diuretics, Hypnotics, Laxatives, Sedatives, and Psychotropics 5  Patient Care Equipment 2  Mobility-Assistance 2  Mobility-Gait 2  Mobility-Sensory Deficit 0  Altered awareness of immediate physical environment 0  Impulsiveness 0  Lack of understanding of one's physical/cognitive limitations 0  Total Score 14  Patient Fall Risk Level High fall risk  Adult Fall Risk Interventions  Required Bundle Interventions *See Row Information* High fall risk - low, moderate, and high requirements implemented  Additional Interventions PT/OT need assessed if change in mobility from baseline;Use of appropriate toileting equipment (bedpan, BSC, etc.)  Screening for Fall Injury Risk (To be completed on HIGH fall risk patients) - Assessing Need for Low Bed  Risk For Fall Injury- Low Bed Criteria Previous fall this admission  Will Implement Low Bed and Floor Mats Yes  Screening for Fall Injury Risk (To be completed on HIGH fall risk patients who do not meet crieteria for Low Bed) - Assessing Need for Floor Mats Only   Risk For Fall Injury- Criteria for Floor Mats Noncompliant with safety precautions  Will Implement Floor Mats Yes  Vitals  Temp 98.2 F (36.8 C)  Temp Source Oral  BP (!) 148/66  BP Location Left Arm  BP Method Automatic  Patient Position (if appropriate) Lying  Pulse Rate 71  Pulse Rate Source Dinamap  Resp 18  Oxygen Therapy  SpO2 95 %  O2 Device Room Air  Pain Assessment  Pain Scale 0-10  Pain Score 0  PCA/Epidural/Spinal Assessment  Respiratory Pattern Regular;Unlabored  Neurological  Neuro (WDL) WDL  Level of Consciousness Alert  Orientation Level Oriented X4  RLE Motor Response Purposeful movement  RLE Sensation Full sensation  LLE Motor Response Purposeful movement  LLE Sensation Full sensation  Musculoskeletal  Musculoskeletal (WDL) X  Assistive Device Front wheel walker  Generalized Weakness Yes  Weight Bearing Restrictions Yes  RLE Weight Bearing WBAT

## 2018-10-20 NOTE — Progress Notes (Signed)
PROGRESS NOTE                                                                                                                                                                                                             Patient Demographics:    Tiffany Fox, is a 85 y.o. female, DOB - Jan 24, 1934, XBJ:[id number]  Admit date - 10/18/2018   Admitting Physician Melrose Nakayama, MD  Outpatient Primary MD for the patient is Rankins, Bill Salinas, MD  LOS - 2   No chief complaint on file.      Brief Narrative   85 y.o. female, with past medical history of depression/anxiety, leukopenia, hypothyroidism, essential hypertension(has been on medication in the past intermittently) was admitted for total right knee replacement, for right knee osteoarthritis, we were consulted for hypertension.   Subjective:    Tiffany Fox today has, No headache, No chest pain, No abdominal pain , no nausea, no vomiting, reports her right knee pain is controlled.    Assessment  & Plan :    Principal Problem:   Primary osteoarthritis of right knee    Essential hypertension -Controlled after starting amlodipine, will continue to monitor, continue with PRN hydralazine, she will need to be discharged on amlodipine 10 mg oral daily.  Hypothyroidism -Continue with home meds  Depression/anxiety -Continue with home meds  Vomiting -No recurrence  Right knee degenerative joint disease -Let us postelective total knee arthroplasty, management per primary orthopedic team -Is on aspirin for DVT prophylaxis,  -  on Protonix, have discussed with her, she is to continue Protonix as long she is on aspirin.  History of leukopenia -at [facility], she is being followed by hematology Dr. Earlie Server as an outpatient   Code Status : Full code  Family Communication  : None at bedside  Disposition Plan  : PT did recommend SNF, but patient wants to go home  Procedures  : Total  right knee replacement  DVT Prophylaxis  : Per Primary team, aspirin 325 mg twice daily  Lab Results  Component Value Date   PLT 146 (L) 10/20/2018    Antibiotics  :    Anti-infectives (From admission, onward)   Start     Dose/Rate Route Frequency Ordered Stop   10/18/18 2000  ceFAZolin (ANCEF) IVPB 2g/100 mL premix     2 g 200 mL/hr  over 30 Minutes Intravenous Every 6 hours 10/18/18 1820 10/19/18 0240   10/18/18 1030  ceFAZolin (ANCEF) IVPB 2g/100 mL premix     2 g 200 mL/hr over 30 Minutes Intravenous On call to O.R. 10/18/18 1016 10/18/18 1258        Objective:   Vitals:   10/19/18 2020 10/20/18 0200 10/20/18 0402 10/20/18 0437  BP: (!) 156/80  (!) 148/66 (!) 148/66  Pulse: 88  71 71  Resp: 20  18 18   Temp: 98.3 F (36.8 C)  98.2 F (36.8 C) 98.2 F (36.8 C)  TempSrc: Oral  Oral Oral  SpO2: 96%  95% 95%  Height:  5' 3.5" (1.613 m)      Wt Readings from Last 3 Encounters:  10/11/18 58.2 kg  11/13/15 57.4 kg  05/08/15 59.8 kg     Intake/Output Summary (Last 24 hours) at 10/20/2018 1243 Last data filed at 10/20/2018 0910 Gross per 24 hour  Intake 2318.86 ml  Output -  Net 2318.86 ml     Physical Exam  Awake Alert, Oriented X 3, No new F.N deficits, Normal affect Symmetrical Chest wall movement, Good air movement bilaterally, CTAB RRR,No Gallops,Rubs or new Murmurs, No Parasternal Heave +ve B.Sounds, Abd Soft, No tenderness,  No rebound - guarding or rigidity. No Cyanosis, Clubbing or edema, No new Rash or bruise      Data Review:    CBC Recent Labs  Lab 10/19/18 0257 10/20/18 0303  WBC 3.7* 3.4*  HGB 11.4* 11.2*  HCT 34.7* 32.7*  PLT 150 146*  MCV 97.2 96.2  MCH 31.9 32.9  MCHC 32.9 34.3  RDW 13.7 13.6    Chemistries  Recent Labs  Lab 10/19/18 0257  NA 135  K 3.7  CL 98  CO2 26  GLUCOSE 106*  BUN 7*  CREATININE 0.77  CALCIUM 8.6*    ------------------------------------------------------------------------------------------------------------------ No results for input(s): CHOL, HDL, LDLCALC, TRIG, CHOLHDL, LDLDIRECT in the last 72 hours.  No results found for: HGBA1C ------------------------------------------------------------------------------------------------------------------ No results for input(s): TSH, T4TOTAL, T3FREE, THYROIDAB in the last 72 hours.  Invalid input(s): FREET3 ------------------------------------------------------------------------------------------------------------------ No results for input(s): VITAMINB12, FOLATE, FERRITIN, TIBC, IRON, RETICCTPCT in the last 72 hours.  Coagulation profile No results for input(s): INR, PROTIME in the last 168 hours.  No results for input(s): DDIMER in the last 72 hours.  Cardiac Enzymes No results for input(s): CKMB, TROPONINI, MYOGLOBIN in the last 168 hours.  Invalid input(s): CK ------------------------------------------------------------------------------------------------------------------ No results found for: BNP  Inpatient Medications  Scheduled Meds: . amLODipine  10 mg Oral Daily  . aspirin EC  325 mg Oral BID PC  . docusate sodium  100 mg Oral BID  . escitalopram  10 mg Oral Daily  . LORazepam  0.5 mg Oral QHS  . pantoprazole  40 mg Oral Daily  . thyroid  60 mg Oral QAC breakfast   Continuous Infusions: . lactated ringers 50 mL/hr at 10/19/18 2038  . methocarbamol (ROBAXIN) IV     PRN Meds:.acetaminophen, alum & mag hydroxide-simeth, bisacodyl, diphenhydrAMINE, hydrALAZINE, HYDROcodone-acetaminophen, menthol-cetylpyridinium **OR** phenol, methocarbamol **OR** methocarbamol (ROBAXIN) IV, metoCLOPramide **OR** metoCLOPramide (REGLAN) injection, morphine injection, ondansetron **OR** ondansetron (ZOFRAN) IV, polyethylene glycol, zolpidem  Micro Results Recent Results (from the past 240 hour(s))  Surgical pcr screen     Status: None    Collection Time: 10/11/18  2:23 PM  Result Value Ref Range Status   MRSA, PCR NEGATIVE NEGATIVE Final   Staphylococcus aureus NEGATIVE NEGATIVE Final    Comment: (NOTE)  The Xpert SA Assay (FDA approved for NASAL specimens in patients 71 years of age and older), is one component of a comprehensive surveillance program. It is not intended to diagnose infection nor to guide or monitor treatment. Performed at [facility], [place] [address], [place], [place] [address]     Radiology Reports Dg Chest 2 View  Result Date: 10/11/2018 CLINICAL DATA:  Preop for knee replacement, history of hypertension EXAM: CHEST - 2 VIEW COMPARISON:  Chest x-ray of 09/01/2011 FINDINGS: The lungs remain somewhat hyperaerated. No pneumonia or pleural effusion is seen. Mediastinal and hilar contours are unremarkable and mild cardiomegaly is stable. There are degenerative changes in the mid to lower thoracic spine. IMPRESSION: Stable hyperaeration. No active lung disease. Borderline cardiomegaly. Electronically Signed   By: Ivar Drape M.D.   On: 10/11/2018 14:44     Phillips Climes M.D on 10/20/2018 at 12:43 PM  Between 7am to 7pm - Pager - [phone]  After 7pm go to [email or web address] - password [facility]  [facility] -  Office  [phone]

## 2018-10-20 NOTE — Progress Notes (Signed)
Physical Therapy Treatment Patient Details Name: Tiffany Fox MRN: [id number] DOB: Dec 27, 1933 Today's Date: 10/20/2018    History of Present Illness 85yo female who failed conservative management of R knee pain, received R TKR 10/18/2018. PMH anxiety, HTN, hx hip fracture     PT Comments    Pt presents to PT sitting in chair, willing to participate in therapy. She reports that her pain has decreased, but that it was previously increased due to sitting with knee extended for a long period. Education provided that sitting for length with knee straight is good to prevent contracture. Pt able to transfer sit to stand with supervision and cues to wait for PT to bring walker. Verbal cues also for hand placement. Pt ambulated about 180 ft with supervision for safety and verbal cues for step through gait pattern and maintaining forward gaze. Pt seemed distracted during ambulation, required multiple cues to maintain focus and look forward while walking. Pt began to perform turn with RW far away from body and required cuing to turn with RW close to body. Pt able to perform stand to sit and sit to supine transfers with supervision for safety but required no physical assist. Pt performed exercises in bed. She was left in bed with bed alarm active and call button within reach. Pt required multiple cues throughout session to maintain safety today.   Follow Up Recommendations  Follow surgeon's recommendation for DC plan and follow-up therapies     Equipment Recommendations  Rolling walker with 5" wheels;3in1 (PT)    Recommendations for Other Services       Precautions / Restrictions Precautions Precautions: Fall;Knee Restrictions Weight Bearing Restrictions: Yes RLE Weight Bearing: Weight bearing as tolerated    Mobility  Bed Mobility Overal bed mobility: Needs Assistance Bed Mobility: Sit to Supine       Sit to supine: Supervision   General bed mobility comments: OOB in bathroom with  nurse tech/left up in chair   Transfers Overall transfer level: Needs assistance Equipment used: Rolling walker (2 wheeled) Transfers: Sit to/from Stand Sit to Stand: Supervision         General transfer comment: supervision for safety and verbal cues for hand placement  Ambulation/Gait Ambulation/Gait assistance: Supervision Gait Distance (Feet): 180 Feet Assistive device: Rolling walker (2 wheeled) Gait Pattern/deviations: Step-through pattern;Decreased step length - left;Decreased stance time - right;Drifts right/left;Trunk flexed Gait velocity: decreased    General Gait Details: verbal cues required for step through gait pattern, cuing to maintain forward gaze and to keep RW close   Stairs             Wheelchair Mobility    Modified Rankin (Stroke Patients Only)       Balance Overall balance assessment: Needs assistance;History of Falls Sitting-balance support: Bilateral upper extremity supported;Feet supported Sitting balance-Leahy Scale: Good     Standing balance support: Bilateral upper extremity supported;During functional activity Standing balance-Leahy Scale: Good Standing balance comment: B UE support on RW for safety, fall in room night of 10/19/2018                             Cognition Arousal/Alertness: Awake/alert Behavior During Therapy: [facility] for tasks assessed/performed Overall Cognitive Status: Within Functional Limits for tasks assessed                                 General Comments: Pt stated  that she felt sleepy from medication at beginning of session but appeared awake and alert during session, noted some cognitive change from previous session . Required cuing to maintain on task during ambulation and to maintain safety throughout session.      Exercises Total Joint Exercises Quad Sets: AROM;Right;5 reps Towel Squeeze: AROM;5 reps Short Arc Quad: AROM;10 reps;Right Heel Slides: AROM;Right;10 reps Hip  ABduction/ADduction: AROM;Right;10 reps Straight Leg Raises: AROM;10 reps;Right Long Arc Quad: AROM;Seated;5 reps;AAROM(active assist given for 3/5 reps)    General Comments        Pertinent Vitals/Pain Pain Assessment: 0-10 Pain Score: 3  Pain Location: R knee  Pain Descriptors / Indicators: Aching;Sore Pain Intervention(s): Monitored during session    Home Living                      Prior Function            PT Goals (current goals can now be found in the care plan section) Acute Rehab PT Goals Patient Stated Goal: go home, start PT  PT Goal Formulation: With patient Time For Goal Achievement: 11/02/18 Potential to Achieve Goals: Good Progress towards PT goals: Progressing toward goals    Frequency    7X/week      PT Plan Current plan remains appropriate    Co-evaluation              AM-PAC PT "6 Clicks" Mobility   Outcome Measure  Help needed turning from your back to your side while in a flat bed without using bedrails?: None Help needed moving from lying on your back to sitting on the side of a flat bed without using bedrails?: A Little Help needed moving to and from a bed to a chair (including a wheelchair)?: A Little Help needed standing up from a chair using your arms (e.g., wheelchair or bedside chair)?: A Little Help needed to walk in hospital room?: A Little Help needed climbing 3-5 steps with a railing? : A Lot 6 Click Score: 18    End of Session   Activity Tolerance: Patient tolerated treatment well Patient left: in bed;with call bell/phone within reach;with bed alarm set Nurse Communication: Mobility status PT Visit Diagnosis: Other abnormalities of gait and mobility (R26.89);Unsteadiness on feet (R26.81);History of falling (Z91.81);Muscle weakness (generalized) (M62.81)     Time: 2025-4270 PT Time Calculation (min) (ACUTE ONLY): 33 min    Ronnell Guadalajara, SPT

## 2018-10-20 NOTE — Plan of Care (Signed)
  Problem: Education: Goal: Knowledge of General Education information will improve Description Including pain rating scale, medication(s)/side effects and non-pharmacologic comfort measures Outcome: Progressing   Problem: Education: Goal: Knowledge of General Education information will improve Description Including pain rating scale, medication(s)/side effects and non-pharmacologic comfort measures Outcome: Progressing   Problem: Health Behavior/Discharge Planning: Goal: Ability to manage health-related needs will improve Outcome: Progressing   Problem: Clinical Measurements: Goal: Ability to maintain clinical measurements within normal limits will improve Outcome: Progressing   Problem: Activity: Goal: Risk for activity intolerance will decrease Outcome: Progressing   Problem: Pain Managment: Goal: General experience of comfort will improve Outcome: Progressing

## 2018-10-20 NOTE — Progress Notes (Signed)
Physical Therapy Treatment Patient Details Name: Tiffany Fox MRN: [id number] DOB: 09-10-1934 Today's Date: 10/20/2018    History of Present Illness 85yo female who failed conservative management of R knee pain, received R TKR 10/18/2018. PMH anxiety, HTN, hx hip fracture     PT Comments    Patient received up in bathroom with nurse tech, pleasant and willing to participate in PT; RN present and confirms patient had fall last night but without injury. Patient education regarding safety precautions taken in hospital, call for assist before bladder/bowel issues and other needs become emergency, continued to reinforce not getting up by herself, patient gives verbal agreement.  Surgical knee AROM in sitting 5 degrees extension/70 degrees flexion. Able to complete functional transfers with S and VC for sequencing/safety today, then tolerated gait training approximately 152f this morning with RW and S, cues for reduced UE pressure on device, and step-through/heel toe pattern. She was left up in the recliner with all needs met, chair alarm active this morning.     Follow Up Recommendations  Follow surgeon's recommendation for DC plan and follow-up therapies     Equipment Recommendations  Rolling walker with 5" wheels;3in1 (PT)    Recommendations for Other Services       Precautions / Restrictions Precautions Precautions: Fall Restrictions Weight Bearing Restrictions: Yes RLE Weight Bearing: Weight bearing as tolerated    Mobility  Bed Mobility               General bed mobility comments: OOB in bathroom with nurse tech/left up in chair   Transfers Overall transfer level: Needs assistance Equipment used: Rolling walker (2 wheeled) Transfers: Sit to/from Stand Sit to Stand: Supervision         General transfer comment: transfers much improved today, able to complete with S and VC for safety and hand placement, no physical assist given   Ambulation/Gait Ambulation/Gait  assistance: Supervision Gait Distance (Feet): 180 Feet Assistive device: Rolling walker (2 wheeled) Gait Pattern/deviations: Step-through pattern;Decreased step length - right;Decreased step length - left;Decreased stance time - right;Decreased stride length;Decreased dorsiflexion - right;Decreased weight shift to right;Drifts right/left;Trunk flexed;Antalgic Gait velocity: decreased    General Gait Details: initially with step to pattern due to pain, improved back to step through pattern, ongoing cues for heel-toe and less UE pressure on RW during gait    Stairs             Wheelchair Mobility    Modified Rankin (Stroke Patients Only)       Balance Overall balance assessment: Needs assistance;History of Falls Sitting-balance support: Bilateral upper extremity supported;Feet supported Sitting balance-Leahy Scale: Good     Standing balance support: Bilateral upper extremity supported;During functional activity Standing balance-Leahy Scale: Good Standing balance comment: B UE support on RW for safety, fall in room night of 10/19/2018                             Cognition Arousal/Alertness: Awake/alert Behavior [facility]: [facility]formed Overall Cognitive Status: Within Functional Limits for tasks assessed                                        Exercises      General Comments        Pertinent Vitals/Pain Pain Assessment: 0-10 Pain Score: 6  Pain Location: R knee  Pain  Descriptors / Indicators: Aching;Sore Pain Intervention(s): Limited activity within patient's tolerance;Monitored during session;Repositioned    Home Living                      Prior Function            PT Goals (current goals can now be found in the care plan section) Acute Rehab PT Goals Patient Stated Goal: go home, start PT  PT Goal Formulation: With patient Time For Goal Achievement: 11/02/18 Potential to Achieve Goals:  Good Progress towards PT goals: Progressing toward goals    Frequency    7X/week      PT Plan Current plan remains appropriate    Co-evaluation              AM-PAC PT "6 Clicks" Mobility   Outcome Measure  Help needed turning from your back to your side while in a flat bed without using bedrails?: None Help needed moving from lying on your back to sitting on the side of a flat bed without using bedrails?: A Little Help needed moving to and from a bed to a chair (including a wheelchair)?: A Little Help needed standing up from a chair using your arms (e.g., wheelchair or bedside chair)?: A Little Help needed to walk in hospital room?: A Little Help needed climbing 3-5 steps with a railing? : A Lot 6 Click Score: 18    End of Session   Activity Tolerance: Patient tolerated treatment well Patient left: in chair;with chair alarm set;with call bell/phone within reach Nurse Communication: Mobility status PT Visit Diagnosis: Unsteadiness on feet (R26.81);Difficulty in walking, not elsewhere classified (R26.2);Muscle weakness (generalized) (M62.81);History of falling (Z91.81)     Time: 1012-1030 PT Time Calculation (min) (ACUTE ONLY): 18 min  Charges:  $Gait Training: 8-22 mins                     Deniece Ree PT, DPT, CBIS  Supplemental Physical Therapist [place]    Pager [phone] Acute Rehab Office [phone]

## 2018-10-21 ENCOUNTER — Encounter (HOSPITAL_COMMUNITY): Payer: Self-pay | Admitting: Orthopaedic Surgery

## 2018-10-21 LAB — CBC
HCT: 31.9 % — ABNORMAL LOW (ref 36.0–46.0)
Hemoglobin: 10.4 g/dL — ABNORMAL LOW (ref 12.0–15.0)
MCH: 32.1 pg (ref 26.0–34.0)
MCHC: 32.6 g/dL (ref 30.0–36.0)
MCV: 98.5 fL (ref 80.0–100.0)
NRBC: 0 % (ref 0.0–0.2)
PLATELETS: 134 10*3/uL — AB (ref 150–400)
RBC: 3.24 MIL/uL — ABNORMAL LOW (ref 3.87–5.11)
RDW: 13.7 % (ref 11.5–15.5)
WBC: 2.8 10*3/uL — ABNORMAL LOW (ref 4.0–10.5)

## 2018-10-21 MED ORDER — HYDROCODONE-ACETAMINOPHEN 5-325 MG PO TABS: 1.0000 | ORAL_TABLET | Freq: Four times a day (QID) | ORAL | 0 refills | Status: DC | PRN

## 2018-10-21 MED ORDER — ASPIRIN 325 MG PO TBEC: 325.0000 mg | DELAYED_RELEASE_TABLET | Freq: Two times a day (BID) | ORAL | 0 refills | Status: DC

## 2018-10-21 MED ORDER — AMLODIPINE BESYLATE 10 MG PO TABS: 10.0000 mg | ORAL_TABLET | Freq: Every day | ORAL | 1 refills | Status: DC

## 2018-10-21 MED ORDER — PANTOPRAZOLE SODIUM 40 MG PO TBEC: 40.0000 mg | DELAYED_RELEASE_TABLET | Freq: Every day | ORAL | 0 refills | Status: DC

## 2018-10-21 MED ORDER — TIZANIDINE HCL 4 MG PO TABS: 4.0000 mg | ORAL_TABLET | Freq: Four times a day (QID) | ORAL | 1 refills | Status: DC | PRN

## 2018-10-21 NOTE — Progress Notes (Signed)
Physical Therapy Treatment Patient Details Name: Tiffany Fox MRN: [id number] DOB: Dec 18, 1933 Today's Date: 10/21/2018    History of Present Illness 85yo female who failed conservative management of R knee pain, received R TKR 10/18/2018. PMH anxiety, HTN, hx hip fracture     PT Comments    Patient seen for LE strengthening/ROM exercises. Pt continues to make progress toward mobility goals. Current plan remains appropriate.    Follow Up Recommendations  Follow surgeon's recommendation for DC plan and follow-up therapies     Equipment Recommendations  Rolling walker with 5" wheels;3in1 (PT)    Recommendations for Other Services       Precautions / Restrictions Precautions Precautions: Fall;Knee Restrictions Weight Bearing Restrictions: Yes RLE Weight Bearing: Weight bearing as tolerated    Mobility  Bed Mobility               General bed mobility comments: pt up in room unassisted upon arrival without AD trying to pack bag  Transfers Overall transfer level: Needs assistance Equipment used: Rolling walker (2 wheeled) Transfers: Sit to/from Stand Sit to Stand: Supervision         General transfer comment: cues for safe use of AD  Ambulation/Gait Ambulation/Gait assistance: Supervision   Assistive device: Rolling walker (2 wheeled) Gait Pattern/deviations: Step-through pattern;Drifts right/left;Decreased stride length Gait velocity: decreased    General Gait Details: ambulated short distance in room ~10 ft wtih cues for use of RW for support   Stairs             Wheelchair Mobility    Modified Rankin (Stroke Patients Only)       Balance Overall balance assessment: Needs assistance;History of Falls Sitting-balance support: Bilateral upper extremity supported;Feet supported Sitting balance-Leahy Scale: Good     Standing balance support: Bilateral upper extremity supported;During functional activity Standing balance-Leahy Scale:  Poor Standing balance comment: able to static stand without UE support                            Cognition Arousal/Alertness: Awake/alert Behavior During Therapy: WFL for tasks assessed/performed Overall Cognitive Status: Within Functional Limits for tasks assessed                                 General Comments: doesn't appear to take fall on 1/15 very seriously      Exercises Total Joint Exercises Ankle Circles/Pumps: Both Quad Sets: Both Towel Squeeze: 10 reps Short Arc Quad: Right;10 reps Heel Slides: Right;10 reps Hip ABduction/ADduction: Right;10 reps Straight Leg Raises: Right;10 reps Long Arc Quad: Right;10 reps Knee Flexion: Right;10 reps;Seated;Other (comment)(10 second holds)    General Comments        Pertinent Vitals/Pain Pain Assessment: Faces Faces Pain Scale: Hurts a little bit Pain Location: R knee  Pain Descriptors / Indicators: Sore Pain Intervention(s): Monitored during session;Repositioned    Home Living                      Prior Function            PT Goals (current goals can now be found in the care plan section) Progress towards PT goals: Progressing toward goals    Frequency    7X/week      PT Plan Current plan remains appropriate    Co-evaluation  AM-PAC PT "6 Clicks" Mobility   Outcome Measure  Help needed turning from your back to your side while in a flat bed without using bedrails?: None Help needed moving from lying on your back to sitting on the side of a flat bed without using bedrails?: A Little Help needed moving to and from a bed to a chair (including a wheelchair)?: A Little Help needed standing up from a chair using your arms (e.g., wheelchair or bedside chair)?: A Little Help needed to walk in hospital room?: A Little Help needed climbing 3-5 steps with a railing? : A Little 6 Click Score: 19    End of Session Equipment Utilized During Treatment: Gait  belt Activity Tolerance: Patient tolerated treatment well Patient left: with call bell/phone within reach;in chair;with chair alarm set Nurse Communication: Mobility status PT Visit Diagnosis: Other abnormalities of gait and mobility (R26.89);Unsteadiness on feet (R26.81);History of falling (Z91.81);Muscle weakness (generalized) (M62.81)     Time: 1460-4799 PT Time Calculation (min) (ACUTE ONLY): 23 min  Charges:  $Therapeutic Exercise: 23-37 mins                     Earney Navy, PTA Acute Rehabilitation Services Pager: [phone] Office: [phone]     Darliss Cheney 10/21/2018, 5:03 PM

## 2018-10-21 NOTE — Progress Notes (Signed)
PROGRESS NOTE                                                                                                                                                                                                             Patient Demographics:    Tiffany Fox, is a 85 y.o. female, DOB - Jun 08, 1934, WEX:[id number]  Admit date - 10/18/2018   Admitting Physician Tiffany Nakayama, MD  Outpatient Primary MD for the patient is Rankins, Bill Salinas, MD  LOS - 3   No chief complaint on file.      Brief Narrative   85 y.o. female, with past medical history of depression/anxiety, leukopenia, hypothyroidism, essential hypertension(has been on medication in the past intermittently) was admitted for total right knee replacement, for right knee osteoarthritis, we were consulted for hypertension.   Subjective:    [name] today has, No headache, No chest pain, No abdominal pain , no nausea, no vomiting, she is excited about going home today    Assessment  & Plan :    Principal Problem:   Primary osteoarthritis of right knee   Essential hypertension -Controlled after starting amlodipine, will continue to monitor, continue with PRN hydralazine, will be discharged on amlodipine 10 mg oral daily, blood pressure to be followed as an outpatient medication to be adjusted, her prescription has been electronically sent to her pharmacy .  Hypothyroidism -Continue with home meds  Depression/anxiety -Continue with home meds  Vomiting -Resolved, no recurrence  Right knee degenerative joint disease -Let us postelective total knee arthroplasty, management per primary orthopedic team -Is on aspirin for DVT prophylaxis,  -  on Protonix, have discussed with her, she is to continue Protonix as long she is on aspirin.  History of leukopenia -at [facility], she is being followed by hematology Dr. Earlie Server as an outpatient   Code Status : Full code  Family  Communication  : None at bedside  Disposition Plan  : PT did recommend SNF, but patient wants to go home  Procedures  : Total right knee replacement  DVT Prophylaxis  : Per Primary team, aspirin 325 mg twice daily  Lab Results  Component Value Date   PLT 134 (L) 10/21/2018    Antibiotics  :    Anti-infectives (From admission, onward)   Start     Dose/Rate Route Frequency Ordered Stop  10/18/18 2000  ceFAZolin (ANCEF) IVPB 2g/100 mL premix     2 g 200 mL/hr over 30 Minutes Intravenous Every 6 hours 10/18/18 1820 10/19/18 0240   10/18/18 1030  ceFAZolin (ANCEF) IVPB 2g/100 mL premix     2 g 200 mL/hr over 30 Minutes Intravenous On call to O.R. 10/18/18 1016 10/18/18 1258        Objective:   Vitals:   10/20/18 1603 10/20/18 2120 10/21/18 0551 10/21/18 0844  BP: (!) 145/70 (!) 141/68 138/65 131/70  Pulse: 80 81 73   Resp: 18 16 16    Temp: 98.2 F (36.8 C) 98.7 F (37.1 C) 98.4 F (36.9 C)   TempSrc: Oral Oral Oral   SpO2: 96% 99% 94%   Height:        Wt Readings from Last 3 Encounters:  10/11/18 58.2 kg  11/13/15 57.4 kg  05/08/15 59.8 kg     Intake/Output Summary (Last 24 hours) at 10/21/2018 1202 Last data filed at 10/20/2018 1330 Gross per 24 hour  Intake 300 ml  Output -  Net 300 ml     Physical Exam  Awake Alert, Oriented X 3, No new F.N deficits, Normal affect Symmetrical Chest wall movement, Good air movement bilaterally, CTAB RRR,No Gallops,Rubs or new Murmurs, No Parasternal Heave +ve B.Sounds, Abd Soft, No tenderness, No rebound - guarding or rigidity. No Cyanosis, Clubbing or edema, No new Rash or bruise       Data Review:    CBC Recent Labs  Lab 10/19/18 0257 10/20/18 0303 10/21/18 0455  WBC 3.7* 3.4* 2.8*  HGB 11.4* 11.2* 10.4*  HCT 34.7* 32.7* 31.9*  PLT 150 146* 134*  MCV 97.2 96.2 98.5  MCH 31.9 32.9 32.1  MCHC 32.9 34.3 32.6  RDW 13.7 13.6 13.7    Chemistries  Recent Labs  Lab 10/19/18 0257  NA 135  K 3.7  CL 98    CO2 26  GLUCOSE 106*  BUN 7*  CREATININE 0.77  CALCIUM 8.6*   ------------------------------------------------------------------------------------------------------------------ No results for input(s): CHOL, HDL, LDLCALC, TRIG, CHOLHDL, LDLDIRECT in the last 72 hours.  No results found for: HGBA1C ------------------------------------------------------------------------------------------------------------------ No results for input(s): TSH, T4TOTAL, T3FREE, THYROIDAB in the last 72 hours.  Invalid input(s): FREET3 ------------------------------------------------------------------------------------------------------------------ No results for input(s): VITAMINB12, FOLATE, FERRITIN, TIBC, IRON, RETICCTPCT in the last 72 hours.  Coagulation profile No results for input(s): INR, PROTIME in the last 168 hours.  No results for input(s): DDIMER in the last 72 hours.  Cardiac Enzymes No results for input(s): CKMB, TROPONINI, MYOGLOBIN in the last 168 hours.  Invalid input(s): CK ------------------------------------------------------------------------------------------------------------------ No results found for: BNP  Inpatient Medications  Scheduled Meds: . amLODipine  10 mg Oral Daily  . aspirin EC  325 mg Oral BID PC  . docusate sodium  100 mg Oral BID  . escitalopram  10 mg Oral Daily  . LORazepam  0.5 mg Oral QHS  . pantoprazole  40 mg Oral Daily  . thyroid  60 mg Oral QAC breakfast   Continuous Infusions: . lactated ringers 50 mL/hr at 10/19/18 2038  . methocarbamol (ROBAXIN) IV     PRN Meds:.acetaminophen, alum & mag hydroxide-simeth, bisacodyl, diphenhydrAMINE, hydrALAZINE, HYDROcodone-acetaminophen, menthol-cetylpyridinium **OR** phenol, methocarbamol **OR** methocarbamol (ROBAXIN) IV, metoCLOPramide **OR** metoCLOPramide (REGLAN) injection, morphine injection, ondansetron **OR** ondansetron (ZOFRAN) IV, polyethylene glycol, zolpidem  Micro Results Recent Results  (from the past 240 hour(s))  Surgical pcr screen     Status: None   Collection Time: 10/11/18  2:23 PM  Result Value Ref Range Status   MRSA, PCR NEGATIVE NEGATIVE Final   Staphylococcus aureus NEGATIVE NEGATIVE Final    Comment: (NOTE) The Xpert SA Assay (FDA approved for NASAL specimens in patients 68 years of age and older), is one component of a comprehensive surveillance program. It is not intended to diagnose infection nor to guide or monitor treatment. Performed at [facility], [place] [address] [place], [place] [address]     Radiology Reports Dg Chest 2 View  Result Date: 10/11/2018 CLINICAL DATA:  Preop for knee replacement, history of hypertension EXAM: CHEST - 2 VIEW COMPARISON:  Chest x-ray of 09/01/2011 FINDINGS: The lungs remain somewhat hyperaerated. No pneumonia or pleural effusion is seen. Mediastinal and hilar contours are unremarkable and mild cardiomegaly is stable. There are degenerative changes in the mid to lower thoracic spine. IMPRESSION: Stable hyperaeration. No active lung disease. Borderline cardiomegaly. Electronically Signed   By: Ivar Drape M.D.   On: 10/11/2018 14:44     Phillips Climes M.D on 10/21/2018 at 12:02 PM  Between 7am to 7pm - Pager - [phone]  After 7pm go to [email or web address] - password [facility]  [facility] -  Office  [phone]

## 2018-10-21 NOTE — Care Management Important Message (Signed)
Important Message  Patient Details  Name: Tiffany Fox MRN: [id number] Date of Birth: 1934-06-10   Medicare Important Message Given:  Yes    Jakobe Blau [address] 10/21/2018, 4:26 PM

## 2018-10-21 NOTE — Discharge Summary (Signed)
Patient ID: Tiffany Fox MRN: [id number] DOB/AGE: [birth year] 85 y.o.  Admit date: 10/18/2018 Discharge date: 10/21/2018  Admission Diagnoses:  Principal Problem:   Primary osteoarthritis of right knee   Discharge Diagnoses:  Same  Past Medical History:  Diagnosis Date  . Anxiety   . Arthritis   . Depression   . Hypertension   . Hypothyroidism   . Leukocytopenia     Surgeries: Procedure(s): TOTAL KNEE ARTHROPLASTY on 10/18/2018   Consultants:   Discharged Condition: Improved  Hospital Course: Tiffany Fox is an 85 y.o. female who was admitted 10/18/2018 for operative treatment ofPrimary osteoarthritis of right knee. Patient has severe unremitting pain that affects sleep, daily activities, and work/hobbies. After pre-op clearance the patient was taken to the operating room on 10/18/2018 and underwent  Procedure(s): TOTAL KNEE ARTHROPLASTY.    Patient was given perioperative antibiotics:  Anti-infectives (From admission, onward)   Start     Dose/Rate Route Frequency Ordered Stop   10/18/18 2000  ceFAZolin (ANCEF) IVPB 2g/100 mL premix     2 g 200 mL/hr over 30 Minutes Intravenous Every 6 hours 10/18/18 1820 10/19/18 0240   10/18/18 1030  ceFAZolin (ANCEF) IVPB 2g/100 mL premix     2 g 200 mL/hr over 30 Minutes Intravenous On call to O.R. 10/18/18 1016 10/18/18 1258       Patient was given sequential compression devices, early ambulation, and chemoprophylaxis to prevent DVT.  Patient benefited maximally from hospital stay and there were no complications.    Recent vital signs:  Patient Vitals for the past 24 hrs:  BP Temp Temp src Pulse Resp SpO2  10/21/18 0551 138/65 98.4 F (36.9 C) Oral 73 16 94 %  10/20/18 2120 (!) 141/68 98.7 F (37.1 C) Oral 81 16 99 %  10/20/18 1603 (!) 145/70 98.2 F (36.8 C) Oral 80 18 96 %     Recent laboratory studies:  Recent Labs    10/19/18 0257 10/20/18 0303 10/21/18 0455  WBC 3.7* 3.4* 2.8*  HGB 11.4* 11.2* 10.4*   HCT 34.7* 32.7* 31.9*  PLT 150 146* 134*  NA 135  --   --   K 3.7  --   --   CL 98  --   --   CO2 26  --   --   BUN 7*  --   --   CREATININE 0.77  --   --   GLUCOSE 106*  --   --   CALCIUM 8.6*  --   --      Discharge Medications:   Allergies as of 10/21/2018      Reactions   Lisinopril Rash      Medication List    TAKE these medications   amLODipine 10 MG tablet Commonly known as:  NORVASC Take 1 tablet (10 mg total) by mouth daily.   aspirin 325 MG EC tablet Take 1 tablet (325 mg total) by mouth 2 (two) times daily after a meal.   cholestyramine 4 g packet Commonly known as:  QUESTRAN Take 4 g by mouth 2 (two) times daily.   escitalopram 10 MG tablet Commonly known as:  LEXAPRO Take 10 mg by mouth daily.   HAIR/SKIN/NAILS PO Take 1 tablet by mouth daily.   HYDROcodone-acetaminophen 5-325 MG tablet Commonly known as:  NORCO/VICODIN Take 1-2 tablets by mouth every 6 (six) hours as needed for moderate pain (pain score 4-6).   LORazepam 0.5 MG tablet Commonly known as:  ATIVAN Take 0.5 mg by  mouth at bedtime.   LUNESTA 2 MG Tabs tablet Generic drug:  eszopiclone Take 2 mg by mouth at bedtime. Take immediately before bedtime   omega-3 acid ethyl esters 1 g capsule Commonly known as:  LOVAZA Take 1 g by mouth 2 (two) times daily.   pantoprazole 40 MG tablet Commonly known as:  PROTONIX Take 1 tablet (40 mg total) by mouth daily.   thyroid 60 MG tablet Commonly known as:  ARMOUR Take 60 mg by mouth daily before breakfast.   tiZANidine 4 MG tablet Commonly known as:  ZANAFLEX Take 1 tablet (4 mg total) by mouth every 6 (six) hours as needed.            Durable Medical Equipment  (From admission, onward)         Start     Ordered   10/18/18 1821  DME Walker rolling  Once    Question:  Patient needs a walker to treat with the following condition  Answer:  Primary osteoarthritis of right knee   10/18/18 1820   10/18/18 1821  DME 3 n 1  Once      10/18/18 1820   10/18/18 1821  DME Bedside commode  Once    Question:  Patient needs a bedside commode to treat with the following condition  Answer:  Primary osteoarthritis of right knee   10/18/18 1820          Diagnostic Studies: Dg Chest 2 View  Result Date: 10/11/2018 CLINICAL DATA:  Preop for knee replacement, history of hypertension EXAM: CHEST - 2 VIEW COMPARISON:  Chest x-ray of 09/01/2011 FINDINGS: The lungs remain somewhat hyperaerated. No pneumonia or pleural effusion is seen. Mediastinal and hilar contours are unremarkable and mild cardiomegaly is stable. There are degenerative changes in the mid to lower thoracic spine. IMPRESSION: Stable hyperaeration. No active lung disease. Borderline cardiomegaly. Electronically Signed   By: Ivar Drape M.D.   On: 10/11/2018 14:44    Disposition: Discharge disposition: 01-Home or Self Care       Discharge Instructions    Call MD / Call 911   Complete by:  As directed    If you experience chest pain or shortness of breath, CALL 911 and be transported to the hospital emergency room.  If you develope a fever above 101 F, pus (white drainage) or increased drainage or redness at the wound, or calf pain, call your surgeon's office.   Constipation Prevention   Complete by:  As directed    Drink plenty of fluids.  Prune juice may be helpful.  You may use a stool softener, such as Colace (over the counter) 100 mg twice a day.  Use MiraLax (over the counter) for constipation as needed.   Diet - low sodium heart healthy   Complete by:  As directed    Discharge instructions   Complete by:  As directed    INSTRUCTIONS AFTER JOINT REPLACEMENT   Remove items at home which could result in a fall. This includes throw rugs or furniture in walking pathways ICE to the affected joint every three hours while awake for 30 minutes at a time, for at least the first 3-5 days, and then as needed for pain and swelling.  Continue to use ice for pain and  swelling. You may notice swelling that will progress down to the foot and ankle.  This is normal after surgery.  Elevate your leg when you are not up walking on it.   Continue to  use the breathing machine you got in the hospital (incentive spirometer) which will help keep your temperature down.  It is common for your temperature to cycle up and down following surgery, especially at night when you are not up moving around and exerting yourself.  The breathing machine keeps your lungs expanded and your temperature down.   DIET:  As you were doing prior to hospitalization, we recommend a well-balanced diet.  DRESSING / WOUND CARE / SHOWERING  You may shower 3 days after surgery, but keep the wounds dry during showering.  You may use an occlusive plastic wrap (Press'n Seal for example), NO SOAKING/SUBMERGING IN THE BATHTUB.  If the bandage gets wet, change with a clean dry gauze.  If the incision gets wet, pat the wound dry with a clean towel.  ACTIVITY  Increase activity slowly as tolerated, but follow the weight bearing instructions below.   No driving for 6 weeks or until further direction given by your physician.  You cannot drive while taking narcotics.  No lifting or carrying greater than 10 lbs. until further directed by your surgeon. Avoid periods of inactivity such as sitting longer than an hour when not asleep. This helps prevent blood clots.  You may return to work once you are authorized by your doctor.     WEIGHT BEARING   Weight bearing as tolerated with assist device (walker, cane, etc) as directed, use it as long as suggested by your surgeon or therapist, typically at least 4-6 weeks.   EXERCISES  Results after joint replacement surgery are often greatly improved when you follow the exercise, range of motion and muscle strengthening exercises prescribed by your doctor. Safety measures are also important to protect the joint from further injury. Any time any of these exercises  cause you to have increased pain or swelling, decrease what you are doing until you are comfortable again and then slowly increase them. If you have problems or questions, call your caregiver or physical therapist for advice.   Rehabilitation is important following a joint replacement. After just a few days of immobilization, the muscles of the leg can become weakened and shrink (atrophy).  These exercises are designed to build up the tone and strength of the thigh and leg muscles and to improve motion. Often times heat used for twenty to thirty minutes before working out will loosen up your tissues and help with improving the range of motion but do not use heat for the first two weeks following surgery (sometimes heat can increase post-operative swelling).   These exercises can be done on a training (exercise) mat, on the floor, on a table or on a bed. Use whatever works the best and is most comfortable for you.    Use music or television while you are exercising so that the exercises are a pleasant break in your day. This will make your life better with the exercises acting as a break in your routine that you can look forward to.   Perform all exercises about fifteen times, three times per day or as directed.  You should exercise both the operative leg and the other leg as well.   Exercises include:   Quad Sets - Tighten up the muscle on the front of the thigh (Quad) and hold for 5-10 seconds.   Straight Leg Raises - With your knee straight (if you were given a brace, keep it on), lift the leg to 60 degrees, hold for 3 seconds, and slowly lower the leg.  Perform this exercise against resistance later as your leg gets stronger.  Leg Slides: Lying on your back, slowly slide your foot toward your buttocks, bending your knee up off the floor (only go as far as is comfortable). Then slowly slide your foot back down until your leg is flat on the floor again.  Angel Wings: Lying on your back spread your legs  to the side as far apart as you can without causing discomfort.  Hamstring Strength:  Lying on your back, push your heel against the floor with your leg straight by tightening up the muscles of your buttocks.  Repeat, but this time bend your knee to a comfortable angle, and push your heel against the floor.  You may put a pillow under the heel to make it more comfortable if necessary.   A rehabilitation program following joint replacement surgery can speed recovery and prevent re-injury in the future due to weakened muscles. Contact your doctor or a physical therapist for more information on knee rehabilitation.    CONSTIPATION  Constipation is defined medically as fewer than three stools per week and severe constipation as less than one stool per week.  Even if you have a regular bowel pattern at home, your normal regimen is likely to be disrupted due to multiple reasons following surgery.  Combination of anesthesia, postoperative narcotics, change in appetite and fluid intake all can affect your bowels.   YOU MUST use at least one of the following options; they are listed in order of increasing strength to get the job done.  They are all available over the counter, and you may need to use some, POSSIBLY even all of these options:    Drink plenty of fluids (prune juice may be helpful) and high fiber foods Colace 100 mg by mouth twice a day  Senokot for constipation as directed and as needed Dulcolax (bisacodyl), take with full glass of water  Miralax (polyethylene glycol) once or twice a day as needed.  If you have tried all these things and are unable to have a bowel movement in the first 3-4 days after surgery call either your surgeon or your primary doctor.    If you experience loose stools or diarrhea, hold the medications until you stool forms back up.  If your symptoms do not get better within 1 week or if they get worse, check with your doctor.  If you experience "the worst abdominal pain  ever" or develop nausea or vomiting, please contact the office immediately for further recommendations for treatment.   ITCHING:  If you experience itching with your medications, try taking only a single pain pill, or even half a pain pill at a time.  You can also use Benadryl over the counter for itching or also to help with sleep.   TED HOSE STOCKINGS:  Use stockings on both legs until for at least 2 weeks or as directed by physician office. They may be removed at night for sleeping.  MEDICATIONS:  See your medication summary on the "After Visit Summary" that nursing will review with you.  You may have some home medications which will be placed on hold until you complete the course of blood thinner medication.  It is important for you to complete the blood thinner medication as prescribed.  PRECAUTIONS:  If you experience chest pain or shortness of breath - call 911 immediately for transfer to the hospital emergency department.   If you develop a fever greater that 101 F, purulent drainage  from wound, increased redness or drainage from wound, foul odor from the wound/dressing, or calf pain - CONTACT YOUR SURGEON.                                                   FOLLOW-UP APPOINTMENTS:  If you do not already have a post-op appointment, please call the office for an appointment to be seen by your surgeon.  Guidelines for how soon to be seen are listed in your "After Visit Summary", but are typically between 1-4 weeks after surgery.  OTHER INSTRUCTIONS:   Knee Replacement:  Do not place pillow under knee, focus on keeping the knee straight while resting. CPM instructions: 0-90 degrees, 2 hours in the morning, 2 hours in the afternoon, and 2 hours in the evening. Place foam block, curve side up under heel at all times except when in CPM or when walking.  DO NOT modify, tear, cut, or change the foam block in any way.  MAKE SURE YOU:  Understand these instructions.  Get help right away if you are  not doing well or get worse.    Thank you for letting us be a part of your medical care team.  It is a privilege we respect greatly.  We hope these instructions will help you stay on track for a fast and full recovery!   Increase activity slowly as tolerated   Complete by:  As directed       Follow-up Information    Melrose Nakayama, MD. Go on 10/28/2018.   Specialty:  Orthopedic Surgery Why:  Your appointment has been made for 13:30 am Contact information: [place] [address] [phone]        [facility] Go on 10/28/2018.   Why:  You are scheduled to start Outpatient Physical therapy at 1240 after you see Dr. Rhona Raider. Please go over to the therapy side and begin your paperwork  Contact information: [facility] [place] [address] [phone]        [facility] Follow up.   Specialty:  Home Health Services Why:  You will have 5 HHPT visits prior to seeing Dr. Rhona Raider in follow up.  Contact information: [address] [place] [address] [phone]        Melrose Nakayama, MD. Schedule an appointment as soon as possible for a visit in 2 weeks.   Specialty:  Orthopedic Surgery Contact information: [address] [place] [address] [phone]            Signed: Larwance Sachs Inmer Nix 10/21/2018, 8:09 AM

## 2018-10-21 NOTE — Anesthesia Postprocedure Evaluation (Signed)
Anesthesia Post Note  Patient: Tiffany Fox  Procedure(s) Performed: TOTAL KNEE ARTHROPLASTY (Right Knee)     Patient location during evaluation: PACU Anesthesia Type: Regional, MAC and Spinal Level of consciousness: oriented and awake and alert Pain management: pain level controlled Vital Signs Assessment: post-procedure vital signs reviewed and stable Respiratory status: spontaneous breathing, respiratory function stable and patient connected to nasal cannula oxygen Cardiovascular status: blood pressure returned to baseline and stable Postop Assessment: no headache, no backache and no apparent nausea or vomiting Anesthetic complications: no    Last Vitals:  Vitals:   10/21/18 0551 10/21/18 0844  BP: 138/65 131/70  Pulse: 73   Resp: 16   Temp: 36.9 C   SpO2: 94%     Last Pain:  Vitals:   10/21/18 1213  TempSrc:   PainSc: [place]

## 2018-10-21 NOTE — Progress Notes (Signed)
Subjective: 3 Days Post-Op Procedure(s) (LRB): TOTAL KNEE ARTHROPLASTY (Right)  Patient slept well last night and is feeling well. She is looking forward to going home.   Activity level:  wbat Diet tolerance:  ok Voiding:  ok Patient reports pain as mild.    Objective: Vital signs in last 24 hours: Temp:  [98.2 F (36.8 C)-98.7 F (37.1 C)] 98.4 F (36.9 C) (01/17 0551) Pulse Rate:  [73-81] 73 (01/17 0551) Resp:  [16-18] 16 (01/17 0551) BP: (138-145)/(65-70) 138/65 (01/17 0551) SpO2:  [94 %-99 %] 94 % (01/17 0551)  Labs: Recent Labs    10/19/18 0257 10/20/18 0303 10/21/18 0455  HGB 11.4* 11.2* 10.4*   Recent Labs    10/20/18 0303 10/21/18 0455  WBC 3.4* 2.8*  RBC 3.40* 3.24*  HCT 32.7* 31.9*  PLT 146* 134*   Recent Labs    10/19/18 0257  NA 135  K 3.7  CL 98  CO2 26  BUN 7*  CREATININE 0.77  GLUCOSE 106*  CALCIUM 8.6*   No results for input(s): LABPT, INR in the last 72 hours.  Physical Exam:  Neurologically intact ABD soft Neurovascular intact Sensation intact distally Intact pulses distally Dorsiflexion/Plantar flexion intact Incision: dressing C/D/I and no drainage No cellulitis present Compartment soft  Assessment/Plan:  3 Days Post-Op Procedure(s) (LRB): TOTAL KNEE ARTHROPLASTY (Right) Advance diet Up with therapy Discharge home with home health today after PT. Continue on ASA 325mg  BID x 2 weeks post op for DVT prevention. Continue amlodipine 10mg  daily for hypertension and follow up with PCP in the next few weeks. Follow up in office 2 weeks post op. Anticipated LOS equal to or greater than 2 midnights due to - Age 83 and older with one or more of the following:  - Obesity  - Expected need for hospital services (PT, OT, Nursing) required for safe  discharge  - Anticipated need for postoperative skilled nursing care or inpatient rehab  - Active co-morbidities: None OR   - Unanticipated findings during/Post Surgery: Slow post-op  progression: GI, pain control, mobility  - Patient is a high risk of re-admission due to: None   [place] 10/21/2018, 8:03 AM

## 2018-10-21 NOTE — Plan of Care (Signed)
  Problem: Clinical Measurements: Goal: Ability to maintain clinical measurements within normal limits will improve Outcome: Progressing   Problem: Clinical Measurements: Goal: Will remain free from infection Outcome: Progressing   Problem: Nutrition: Goal: Adequate nutrition will be maintained Outcome: Progressing   Problem: Coping: Goal: Level of anxiety will decrease Outcome: Progressing

## 2018-10-21 NOTE — Progress Notes (Signed)
Pt discharged home with family member. Pt taken to front via w./c with staff

## 2018-10-21 NOTE — Progress Notes (Signed)
Physical Therapy Treatment Patient Details Name: Tiffany Fox MRN: [id number] DOB: 1934/04/27 Today's Date: 10/21/2018    History of Present Illness 85yo female who failed conservative management of R knee pain, received R TKR 10/18/2018. PMH anxiety, HTN, hx hip fracture     PT Comments    Patient seen for mobility progression. Pt is making progress toward PT goals and tolerated session well with c/o 4/10 pain.  Reviewed positioning/precautions with pt. HEP next session.   Follow Up Recommendations  Follow surgeon's recommendation for DC plan and follow-up therapies     Equipment Recommendations  Rolling walker with 5" wheels;3in1 (PT)    Recommendations for Other Services       Precautions / Restrictions Precautions Precautions: Fall;Knee Restrictions Weight Bearing Restrictions: Yes RLE Weight Bearing: Weight bearing as tolerated    Mobility  Bed Mobility               General bed mobility comments: pt up in room with RN upon arrival  Transfers Overall transfer level: Needs assistance Equipment used: Rolling walker (2 wheeled) Transfers: Sit to/from Stand Sit to Stand: Supervision         General transfer comment: supervision for safety; safe hand placement demonstrated  Ambulation/Gait Ambulation/Gait assistance: Supervision Gait Distance (Feet): 200 Feet Assistive device: Rolling walker (2 wheeled) Gait Pattern/deviations: Step-through pattern;Drifts right/left;Decreased stride length Gait velocity: decreased    General Gait Details: cues for safe proximity to RW at times; pt easily distracted by environment; good step through pattern   Stairs             Wheelchair Mobility    Modified Rankin (Stroke Patients Only)       Balance Overall balance assessment: Needs assistance;History of Falls Sitting-balance support: Bilateral upper extremity supported;Feet supported Sitting balance-Leahy Scale: Good     Standing balance  support: Bilateral upper extremity supported;During functional activity Standing balance-Leahy Scale: Poor                              Cognition Arousal/Alertness: Awake/alert Behavior During Therapy: WFL for tasks assessed/performed Overall Cognitive Status: Within Functional Limits for tasks assessed                                 General Comments: doesn't appear to take fall on 1/15 very seriously      Exercises      General Comments        Pertinent Vitals/Pain Pain Assessment: 0-10 Pain Score: 4  Pain Location: R knee  Pain Descriptors / Indicators: Sore Pain Intervention(s): Monitored during session;Repositioned    Home Living                      Prior Function            PT Goals (current goals can now be found in the care plan section) Progress towards PT goals: Progressing toward goals    Frequency    7X/week      PT Plan Current plan remains appropriate    Co-evaluation              AM-PAC PT "6 Clicks" Mobility   Outcome Measure  Help needed turning from your back to your side while in a flat bed without using bedrails?: None Help needed moving from lying on your back to sitting on the side of  a flat bed without using bedrails?: A Little Help needed moving to and from a bed to a chair (including a wheelchair)?: A Little Help needed standing up from a chair using your arms (e.g., wheelchair or bedside chair)?: A Little Help needed to walk in hospital room?: A Little Help needed climbing 3-5 steps with a railing? : A Little 6 Click Score: 19    End of Session Equipment Utilized During Treatment: Gait belt Activity Tolerance: Patient tolerated treatment well Patient left: with call bell/phone within reach;in chair;with chair alarm set Nurse Communication: Mobility status PT Visit Diagnosis: Other abnormalities of gait and mobility (R26.89);Unsteadiness on feet (R26.81);History of falling  (Z91.81);Muscle weakness (generalized) (M62.81)     Time: 7062-3762 PT Time Calculation (min) (ACUTE ONLY): 22 min  Charges:  $Gait Training: 8-22 mins                     Earney Navy, PTA Acute Rehabilitation Services Pager: [phone] Office: [phone]     Darliss Cheney 10/21/2018, 9:32 AM

## 2018-10-22 DIAGNOSIS — E039 Hypothyroidism, unspecified: Secondary | ICD-10-CM | POA: Diagnosis not present

## 2018-10-22 DIAGNOSIS — Z9181 History of falling: Secondary | ICD-10-CM | POA: Diagnosis not present

## 2018-10-22 DIAGNOSIS — Z96651 Presence of right artificial knee joint: Secondary | ICD-10-CM | POA: Diagnosis not present

## 2018-10-22 DIAGNOSIS — D72819 Decreased white blood cell count, unspecified: Secondary | ICD-10-CM | POA: Diagnosis not present

## 2018-10-22 DIAGNOSIS — D61818 Other pancytopenia: Secondary | ICD-10-CM | POA: Diagnosis not present

## 2018-10-22 DIAGNOSIS — I10 Essential (primary) hypertension: Secondary | ICD-10-CM | POA: Diagnosis not present

## 2018-10-22 DIAGNOSIS — Z471 Aftercare following joint replacement surgery: Secondary | ICD-10-CM | POA: Diagnosis not present

## 2018-10-22 DIAGNOSIS — Z7982 Long term (current) use of aspirin: Secondary | ICD-10-CM | POA: Diagnosis not present

## 2018-10-22 DIAGNOSIS — F419 Anxiety disorder, unspecified: Secondary | ICD-10-CM | POA: Diagnosis not present

## 2018-10-22 DIAGNOSIS — F329 Major depressive disorder, single episode, unspecified: Secondary | ICD-10-CM | POA: Diagnosis not present

## 2018-10-24 DIAGNOSIS — F419 Anxiety disorder, unspecified: Secondary | ICD-10-CM | POA: Diagnosis not present

## 2018-10-24 DIAGNOSIS — D72819 Decreased white blood cell count, unspecified: Secondary | ICD-10-CM | POA: Diagnosis not present

## 2018-10-24 DIAGNOSIS — F329 Major depressive disorder, single episode, unspecified: Secondary | ICD-10-CM | POA: Diagnosis not present

## 2018-10-24 DIAGNOSIS — I10 Essential (primary) hypertension: Secondary | ICD-10-CM | POA: Diagnosis not present

## 2018-10-24 DIAGNOSIS — Z471 Aftercare following joint replacement surgery: Secondary | ICD-10-CM | POA: Diagnosis not present

## 2018-10-24 DIAGNOSIS — D61818 Other pancytopenia: Secondary | ICD-10-CM | POA: Diagnosis not present

## 2018-10-25 DIAGNOSIS — I10 Essential (primary) hypertension: Secondary | ICD-10-CM | POA: Diagnosis not present

## 2018-10-25 DIAGNOSIS — D61818 Other pancytopenia: Secondary | ICD-10-CM | POA: Diagnosis not present

## 2018-10-25 DIAGNOSIS — D72819 Decreased white blood cell count, unspecified: Secondary | ICD-10-CM | POA: Diagnosis not present

## 2018-10-25 DIAGNOSIS — Z471 Aftercare following joint replacement surgery: Secondary | ICD-10-CM | POA: Diagnosis not present

## 2018-10-25 DIAGNOSIS — F419 Anxiety disorder, unspecified: Secondary | ICD-10-CM | POA: Diagnosis not present

## 2018-10-25 DIAGNOSIS — F329 Major depressive disorder, single episode, unspecified: Secondary | ICD-10-CM | POA: Diagnosis not present

## 2018-10-26 DIAGNOSIS — D61818 Other pancytopenia: Secondary | ICD-10-CM | POA: Diagnosis not present

## 2018-10-26 DIAGNOSIS — Z471 Aftercare following joint replacement surgery: Secondary | ICD-10-CM | POA: Diagnosis not present

## 2018-10-26 DIAGNOSIS — F419 Anxiety disorder, unspecified: Secondary | ICD-10-CM | POA: Diagnosis not present

## 2018-10-26 DIAGNOSIS — F329 Major depressive disorder, single episode, unspecified: Secondary | ICD-10-CM | POA: Diagnosis not present

## 2018-10-26 DIAGNOSIS — D72819 Decreased white blood cell count, unspecified: Secondary | ICD-10-CM | POA: Diagnosis not present

## 2018-10-26 DIAGNOSIS — I10 Essential (primary) hypertension: Secondary | ICD-10-CM | POA: Diagnosis not present

## 2018-10-27 DIAGNOSIS — F329 Major depressive disorder, single episode, unspecified: Secondary | ICD-10-CM | POA: Diagnosis not present

## 2018-10-27 DIAGNOSIS — D72819 Decreased white blood cell count, unspecified: Secondary | ICD-10-CM | POA: Diagnosis not present

## 2018-10-27 DIAGNOSIS — I10 Essential (primary) hypertension: Secondary | ICD-10-CM | POA: Diagnosis not present

## 2018-10-27 DIAGNOSIS — D61818 Other pancytopenia: Secondary | ICD-10-CM | POA: Diagnosis not present

## 2018-10-27 DIAGNOSIS — F419 Anxiety disorder, unspecified: Secondary | ICD-10-CM | POA: Diagnosis not present

## 2018-10-27 DIAGNOSIS — Z471 Aftercare following joint replacement surgery: Secondary | ICD-10-CM | POA: Diagnosis not present

## 2018-10-28 DIAGNOSIS — M1711 Unilateral primary osteoarthritis, right knee: Secondary | ICD-10-CM | POA: Diagnosis not present

## 2018-10-28 DIAGNOSIS — M25661 Stiffness of right knee, not elsewhere classified: Secondary | ICD-10-CM | POA: Diagnosis not present

## 2018-10-28 DIAGNOSIS — Z96651 Presence of right artificial knee joint: Secondary | ICD-10-CM | POA: Diagnosis not present

## 2018-10-31 DIAGNOSIS — M25661 Stiffness of right knee, not elsewhere classified: Secondary | ICD-10-CM | POA: Diagnosis not present

## 2018-10-31 DIAGNOSIS — Z96651 Presence of right artificial knee joint: Secondary | ICD-10-CM | POA: Diagnosis not present

## 2018-11-02 DIAGNOSIS — M25661 Stiffness of right knee, not elsewhere classified: Secondary | ICD-10-CM | POA: Diagnosis not present

## 2018-11-02 DIAGNOSIS — Z96651 Presence of right artificial knee joint: Secondary | ICD-10-CM | POA: Diagnosis not present

## 2018-11-04 DIAGNOSIS — M25661 Stiffness of right knee, not elsewhere classified: Secondary | ICD-10-CM | POA: Diagnosis not present

## 2018-11-04 DIAGNOSIS — Z96651 Presence of right artificial knee joint: Secondary | ICD-10-CM | POA: Diagnosis not present

## 2018-11-07 ENCOUNTER — Encounter (HOSPITAL_COMMUNITY): Payer: Self-pay | Admitting: Emergency Medicine

## 2018-11-07 ENCOUNTER — Emergency Department (HOSPITAL_COMMUNITY): Payer: Medicare Other

## 2018-11-07 ENCOUNTER — Emergency Department (HOSPITAL_COMMUNITY)
Admission: EM | Admit: 2018-11-07 | Discharge: 2018-11-08 | Disposition: A | Discharge status: Home / Self Care | Payer: Medicare Other | Attending: Emergency Medicine | Admitting: Emergency Medicine

## 2018-11-07 DIAGNOSIS — M25561 Pain in right knee: Secondary | ICD-10-CM | POA: Diagnosis not present

## 2018-11-07 DIAGNOSIS — M7989 Other specified soft tissue disorders: Secondary | ICD-10-CM | POA: Diagnosis not present

## 2018-11-07 DIAGNOSIS — Z79899 Other long term (current) drug therapy: Secondary | ICD-10-CM | POA: Diagnosis not present

## 2018-11-07 DIAGNOSIS — I10 Essential (primary) hypertension: Secondary | ICD-10-CM | POA: Insufficient documentation

## 2018-11-07 DIAGNOSIS — R2241 Localized swelling, mass and lump, right lower limb: Secondary | ICD-10-CM | POA: Diagnosis not present

## 2018-11-07 DIAGNOSIS — Z87891 Personal history of nicotine dependence: Secondary | ICD-10-CM | POA: Diagnosis not present

## 2018-11-07 DIAGNOSIS — E039 Hypothyroidism, unspecified: Secondary | ICD-10-CM | POA: Diagnosis not present

## 2018-11-07 DIAGNOSIS — M79661 Pain in right lower leg: Secondary | ICD-10-CM | POA: Diagnosis not present

## 2018-11-07 DIAGNOSIS — Z7982 Long term (current) use of aspirin: Secondary | ICD-10-CM | POA: Diagnosis not present

## 2018-11-07 LAB — CBC WITH DIFFERENTIAL/PLATELET
Abs Immature Granulocytes: 0.02 10*3/uL (ref 0.00–0.07)
Basophils Absolute: 0 10*3/uL (ref 0.0–0.1)
Basophils Relative: 1 %
EOS ABS: 0.1 10*3/uL (ref 0.0–0.5)
EOS PCT: 3 %
HCT: 38.4 % (ref 36.0–46.0)
Hemoglobin: 12.3 g/dL (ref 12.0–15.0)
Immature Granulocytes: 1 %
Lymphocytes Relative: 23 %
Lymphs Abs: 0.8 10*3/uL (ref 0.7–4.0)
MCH: 31.2 pg (ref 26.0–34.0)
MCHC: 32 g/dL (ref 30.0–36.0)
MCV: 97.5 fL (ref 80.0–100.0)
Monocytes Absolute: 0.6 10*3/uL (ref 0.1–1.0)
Monocytes Relative: 16 %
Neutro Abs: 2 10*3/uL (ref 1.7–7.7)
Neutrophils Relative %: 56 %
Platelets: 321 10*3/uL (ref 150–400)
RBC: 3.94 MIL/uL (ref 3.87–5.11)
RDW: 13.7 % (ref 11.5–15.5)
WBC: 3.6 10*3/uL — ABNORMAL LOW (ref 4.0–10.5)
nRBC: 0 % (ref 0.0–0.2)

## 2018-11-07 LAB — BASIC METABOLIC PANEL
Anion gap: 12 (ref 5–15)
BUN: 18 mg/dL (ref 8–23)
CALCIUM: 9.9 mg/dL (ref 8.9–10.3)
CO2: 25 mmol/L (ref 22–32)
CREATININE: 0.99 mg/dL (ref 0.44–1.00)
Chloride: 98 mmol/L (ref 98–111)
GFR calc Af Amer: >60 mL/min (ref >60)
GFR calc non Af Amer: 53 mL/min — ABNORMAL LOW (ref >60)
Glucose, Bld: 115 mg/dL — ABNORMAL HIGH (ref 70–99)
Potassium: 3.4 mmol/L — ABNORMAL LOW (ref 3.5–5.1)
Sodium: 135 mmol/L (ref 135–145)

## 2018-11-07 MED ORDER — IOPAMIDOL (ISOVUE-370) INJECTION 76%
75.0000 mL | Freq: Once | INTRAVENOUS | Status: AC | PRN
  Administered 2018-11-07: 75 mL via INTRAVENOUS

## 2018-11-07 MED ORDER — SODIUM CHLORIDE 0.9 % IV BOLUS: 500.0000 mL | Freq: Once | INTRAVENOUS | Status: DC

## 2018-11-07 MED ORDER — RIVAROXABAN 15 MG PO TABS
15.0000 mg | ORAL_TABLET | Freq: Once | ORAL | Status: AC
  Administered 2018-11-08: 15 mg via ORAL
  Filled 2018-11-07: qty 1

## 2018-11-07 MED ORDER — IOPAMIDOL (ISOVUE-370) INJECTION 76%
INTRAVENOUS | Status: AC
  Filled 2018-11-07: qty 100

## 2018-11-07 NOTE — ED Notes (Signed)
Pt returned from CT °

## 2018-11-07 NOTE — ED Notes (Signed)
Patient transported to CT 

## 2018-11-07 NOTE — ED Provider Notes (Signed)
[facility] EMERGENCY DEPARTMENT Provider Note   CSN: [id number] Arrival date & time: 11/07/18  1754     History   Chief Complaint Chief Complaint  Patient presents with  . Knee Pain    rule out DVT    HPI SHEMIAH ROSCH is a 85 y.o. female.  Patient is a 85 year old female who presents with leg pain and swelling.  She had a knee replacement 3 weeks ago by Dr. Novella Olive with [facility] orthopedists.  She states over the last few days she has noted some redness starting in the lateral aspect of her knee and extending down her leg.  She has diffuse swelling of the leg which started yesterday.  She has some increased pain in the posterior aspect of her knee.  She does have a prior history of DVT after a pregnancy about 50 years ago.  She is not currently on anticoagulants.  She denies any shortness of breath.  No known fevers.  No nausea or vomiting.  She was seen at the orthopedic office today and they were concerned about a DVT and sent her here for further evaluation.     Past Medical History:  Diagnosis Date  . Anxiety   . Arthritis   . Depression   . Hypertension   . Hypothyroidism   . Leukocytopenia     Patient Active Problem List   Diagnosis Date Noted  . Primary osteoarthritis of right knee 10/18/2018  . Leukocytopenia 12/18/2014  . Other pancytopenia ([place]) 12/18/2014    Past Surgical History:  Procedure Laterality Date  . APPENDECTOMY  2012  . EYE SURGERY Bilateral 2012  . FRACTURE SURGERY    . HIP FRACTURE SURGERY Left 05.2011  . JOINT REPLACEMENT    . TOTAL KNEE ARTHROPLASTY Right 10/18/2018  . TOTAL KNEE ARTHROPLASTY Right 10/18/2018   Procedure: TOTAL KNEE ARTHROPLASTY;  Surgeon: Melrose Nakayama, MD;  Location: [facility];  Service: Orthopedics;  Laterality: Right;     OB History   No obstetric history on file.      Home Medications    Prior to Admission medications   Medication Sig Start Date End Date Taking? Authorizing Provider    amLODipine (NORVASC) 10 MG tablet Take 1 tablet (10 mg total) by mouth daily. 10/21/18   Loni Dolly, PA-C  aspirin EC 325 MG EC tablet Take 1 tablet (325 mg total) by mouth 2 (two) times daily after a meal. 10/21/18   Loni Dolly, PA-C  cholestyramine (QUESTRAN) 4 g packet Take 4 g by mouth 2 (two) times daily.    [provider]  escitalopram (LEXAPRO) 10 MG tablet Take 10 mg by mouth daily.    [provider]  eszopiclone (LUNESTA) 2 MG TABS tablet Take 2 mg by mouth at bedtime. Take immediately before bedtime    [provider]  HYDROcodone-acetaminophen (NORCO/VICODIN) 5-325 MG tablet Take 1-2 tablets by mouth every 6 (six) hours as needed for moderate pain (pain score 4-6). 10/21/18   Loni Dolly, PA-C  LORazepam (ATIVAN) 0.5 MG tablet Take 0.5 mg by mouth at bedtime.    [provider]  Multiple Vitamins-Minerals (HAIR/SKIN/NAILS PO) Take 1 tablet by mouth daily.    [provider]  omega-3 acid ethyl esters (LOVAZA) 1 G capsule Take 1 g by mouth 2 (two) times daily.      [provider]  pantoprazole (PROTONIX) 40 MG tablet Take 1 tablet (40 mg total) by mouth daily. 10/21/18   Elgergawy, Silver Huguenin, MD  thyroid (ARMOUR) 60 MG tablet Take 60 mg by mouth daily before breakfast.     [provider]  tiZANidine (ZANAFLEX) 4 MG tablet Take 1 tablet (4 mg total) by mouth every 6 (six) hours as needed. 10/21/18 10/21/19  Loni Dolly, PA-C    Family History History reviewed. No pertinent family history.  Social History Social History   Tobacco Use  . Smoking status: Never Smoker  . Smokeless tobacco: Never Used  Substance Use Topics  . Alcohol use: Yes    Alcohol/week: 2.0 standard drinks    Types: 2 Glasses of wine per week    Comment: wine   . Drug use: No     Allergies   Lisinopril   Review of Systems Review of Systems  Constitutional: Negative for chills, diaphoresis, fatigue and fever.  HENT: Negative for  congestion, rhinorrhea and sneezing.   Eyes: Negative.   Respiratory: Negative for cough, chest tightness and shortness of breath.   Cardiovascular: Positive for leg swelling. Negative for chest pain.  Gastrointestinal: Negative for abdominal pain, blood in stool, diarrhea, nausea and vomiting.  Genitourinary: Negative for difficulty urinating, flank pain, frequency and hematuria.  Musculoskeletal: Negative for arthralgias and back pain.  Skin: Positive for color change. Negative for rash.  Neurological: Negative for dizziness, speech difficulty, weakness, numbness and headaches.     Physical Exam Updated Vital Signs BP (!) 165/81 (BP Location: Right Arm)   Pulse 100   Temp 98.7 F (37.1 C) (Oral)   Resp 16   SpO2 100%   Physical Exam Constitutional:      Appearance: She is well-developed.  HENT:     Head: Normocephalic and atraumatic.  Eyes:     Pupils: Pupils are equal, round, and reactive to light.  Neck:     Musculoskeletal: Normal range of motion and neck supple.  Cardiovascular:     Rate and Rhythm: Normal rate and regular rhythm.     Heart sounds: Normal heart sounds.  Pulmonary:     Effort: Pulmonary effort is normal. No respiratory distress.     Breath sounds: Normal breath sounds. No wheezing or rales.  Chest:     Chest wall: No tenderness.  Abdominal:     General: Bowel sounds are normal.     Palpations: Abdomen is soft.     Tenderness: There is no abdominal tenderness. There is no guarding or rebound.  Musculoskeletal: Normal range of motion.     Comments: Patient has a healing incision site on the anterior right knee.  There is some redness diffusely to the right leg from the knee down.  There is some increased warmth and erythema to the lateral aspect of the right knee.  There is some mild diffuse swelling of the lower leg from the knee down.  Pedal pulses are intact.  She has normal sensation and motor function distally.  Lymphadenopathy:     Cervical: No  cervical adenopathy.  Skin:    General: Skin is warm and dry.     Findings: No rash.  Neurological:     Mental Status: She is alert and oriented to person, place, and time.      ED Treatments / Results  Labs (all labs ordered are listed, but only abnormal results are displayed) Labs Reviewed  BASIC METABOLIC PANEL - Abnormal; Notable for the following components:      Result Value   Potassium 3.4 (*)    Glucose, Bld 115 (*)    GFR calc non  Af Amer 53 (*)    All other components within normal limits  CBC WITH DIFFERENTIAL/PLATELET - Abnormal; Notable for the following components:   WBC 3.6 (*)    All other components within normal limits    EKG None  Radiology Ct Angio Chest Pe W/cm &/or Wo Cm  Result Date: 11/07/2018 CLINICAL DATA:  85 year old female status post right knee surgery last month. Woke with posterior knee pain, redness and inflammation on 11/05/2018. EXAM: CT ANGIOGRAPHY CHEST WITH CONTRAST TECHNIQUE: Multidetector CT imaging of the chest was performed using the standard protocol during bolus administration of intravenous contrast. Multiplanar CT image reconstructions and MIPs were obtained to evaluate the vascular anatomy. CONTRAST:  7mL ISOVUE-370 IOPAMIDOL (ISOVUE-370) INJECTION 76% COMPARISON:  Chest radiographs 10/11/2018. CT Abdomen and Pelvis 12/09/2010. FINDINGS: Cardiovascular: Good contrast bolus timing in the pulmonary arterial tree. Lower lobe respiratory motion. No focal filling defect identified in the pulmonary arteries to suggest acute pulmonary embolism. Calcified coronary artery atherosclerosis (series 6, image 142). Cardiac size at the upper limits of normal. No pericardial effusion. Negative visible aorta aside from atherosclerosis. Mediastinum/Nodes: Negative. No lymphadenopathy. Lungs/Pleura: Major airways are patent. Mild lingula atelectasis. Chronic linear scarring in the left lower lobe is stable since 2012. No other abnormal pulmonary opacity. No  pleural effusion. Upper Abdomen: Small gastric hiatal hernia. Negative visible liver, spleen, adrenal glands, left renal upper pole. Musculoskeletal: Intermittent advanced disc degeneration. Osteopenia. No acute osseous abnormality identified. Review of the MIP images confirms the above findings. IMPRESSION: 1. Negative for acute pulmonary embolus. 2. No acute findings in the chest other than pulmonary atelectasis. 3. Calcified coronary artery atherosclerosis. 4. Small gastric hiatal hernia. Electronically Signed   By: Genevie Ann M.D.   On: 11/07/2018 23:33    Procedures Procedures (including critical care time)  Medications Ordered in ED Medications  iopamidol (ISOVUE-370) 76 % injection (has no administration in time range)  sodium chloride 0.9 % bolus 500 mL (has no administration in time range)  rivaroxaban (XARELTO) tablet 20 mg (has no administration in time range)  iopamidol (ISOVUE-370) 76 % injection 75 mL (75 mLs Intravenous Contrast Given 11/07/18 2318)     Initial Impression / Assessment and Plan / ED Course  I have reviewed the triage vital signs and the nursing notes.  Pertinent labs & imaging results that were available during my care of the patient were reviewed by me and considered in my medical decision making (see chart for details).     Patient presents with right leg swelling.  She has no shortness of breath but was tachycardic so I did do a CT scan of her chest which shows no evidence of PE.  Her labs are non-concerning.  We are unable to do a Doppler ultrasound tonight.  I spoke with Dr. Lucia Gaskins with her orthopedic group who advises that he spoke with the provider that saw the patient in the office earlier today and they did not feel that it was consistent with cellulitis.  He is okay with patient being discharged with a dose of Xarelto and she will be brought back tomorrow to do a Doppler ultrasound.  I explained this to the patient.  She is going home with friends and they  will bring her back at 8:00 in the morning to the main hospital entrance to obtain a Doppler ultrasound.  This has been ordered.  Final Clinical Impressions(s) / ED Diagnoses   Final diagnoses:  Leg swelling    ED Discharge Orders  Ordered    VAS [place] LOWER EXTREMITY VENOUS (DVT)     11/07/18 2348           Malvin Johns, MD 11/07/18 2351

## 2018-11-07 NOTE — ED Triage Notes (Signed)
Pt sent by [facility] orthopaedic center, pt was right knee arthroplasty on 10/18/18, woke up on Saturday morning with knee pain on the back side, leg is red, hot and swollen.  Denies any SOB or CP. MD requesting DVT study.

## 2018-11-07 NOTE — Discharge Instructions (Signed)
Follow-up at the main hospital entrance tomorrow at 8:00 AM to have a vascular ultrasound on your right leg to assess for DVT.

## 2018-11-08 ENCOUNTER — Ambulatory Visit (HOSPITAL_BASED_OUTPATIENT_CLINIC_OR_DEPARTMENT_OTHER)
Admission: RE | Admit: 2018-11-08 | Discharge: 2018-11-08 | Disposition: A | Discharge status: Home / Self Care | Payer: Medicare Other | Source: Ambulatory Visit | Attending: Emergency Medicine | Admitting: Emergency Medicine

## 2018-11-08 DIAGNOSIS — M7989 Other specified soft tissue disorders: Secondary | ICD-10-CM | POA: Diagnosis not present

## 2018-11-08 DIAGNOSIS — R2241 Localized swelling, mass and lump, right lower limb: Secondary | ICD-10-CM | POA: Diagnosis not present

## 2018-11-08 NOTE — Progress Notes (Signed)
Right lower extremity venous duplex has been completed. Negative for DVT.  11/08/18 8:52 AM Carlos Levering RVT

## 2018-11-09 DIAGNOSIS — R42 Dizziness and giddiness: Secondary | ICD-10-CM | POA: Diagnosis not present

## 2018-11-10 DIAGNOSIS — Z96651 Presence of right artificial knee joint: Secondary | ICD-10-CM | POA: Diagnosis not present

## 2018-11-10 DIAGNOSIS — M25661 Stiffness of right knee, not elsewhere classified: Secondary | ICD-10-CM | POA: Diagnosis not present

## 2018-11-10 DIAGNOSIS — Z471 Aftercare following joint replacement surgery: Secondary | ICD-10-CM | POA: Diagnosis not present

## 2018-11-14 DIAGNOSIS — M25661 Stiffness of right knee, not elsewhere classified: Secondary | ICD-10-CM | POA: Diagnosis not present

## 2018-11-14 DIAGNOSIS — Z96651 Presence of right artificial knee joint: Secondary | ICD-10-CM | POA: Diagnosis not present

## 2018-11-14 DIAGNOSIS — F332 Major depressive disorder, recurrent severe without psychotic features: Secondary | ICD-10-CM | POA: Diagnosis not present

## 2018-11-15 DIAGNOSIS — R42 Dizziness and giddiness: Secondary | ICD-10-CM | POA: Diagnosis not present

## 2018-11-15 DIAGNOSIS — I499 Cardiac arrhythmia, unspecified: Secondary | ICD-10-CM | POA: Diagnosis not present

## 2018-11-15 DIAGNOSIS — E039 Hypothyroidism, unspecified: Secondary | ICD-10-CM | POA: Diagnosis not present

## 2018-11-16 DIAGNOSIS — Z96651 Presence of right artificial knee joint: Secondary | ICD-10-CM | POA: Diagnosis not present

## 2018-11-17 DIAGNOSIS — Z96651 Presence of right artificial knee joint: Secondary | ICD-10-CM | POA: Diagnosis not present

## 2018-11-17 DIAGNOSIS — M25661 Stiffness of right knee, not elsewhere classified: Secondary | ICD-10-CM | POA: Diagnosis not present

## 2018-11-22 DIAGNOSIS — M25661 Stiffness of right knee, not elsewhere classified: Secondary | ICD-10-CM | POA: Diagnosis not present

## 2018-11-22 DIAGNOSIS — Z96651 Presence of right artificial knee joint: Secondary | ICD-10-CM | POA: Diagnosis not present

## 2018-11-23 DIAGNOSIS — R42 Dizziness and giddiness: Secondary | ICD-10-CM | POA: Diagnosis not present

## 2018-11-24 DIAGNOSIS — M25661 Stiffness of right knee, not elsewhere classified: Secondary | ICD-10-CM | POA: Diagnosis not present

## 2018-11-24 DIAGNOSIS — Z96651 Presence of right artificial knee joint: Secondary | ICD-10-CM | POA: Diagnosis not present

## 2018-11-25 ENCOUNTER — Encounter: Payer: Self-pay | Admitting: Cardiology

## 2018-11-28 ENCOUNTER — Telehealth: Payer: Self-pay | Admitting: Cardiology

## 2018-11-28 NOTE — Telephone Encounter (Signed)
Records received from [facility] on 11/28/18, Appt 11/30/18 @ 3:40PM.Nv

## 2018-11-29 DIAGNOSIS — M25661 Stiffness of right knee, not elsewhere classified: Secondary | ICD-10-CM | POA: Diagnosis not present

## 2018-11-29 DIAGNOSIS — Z96651 Presence of right artificial knee joint: Secondary | ICD-10-CM | POA: Diagnosis not present

## 2018-11-30 ENCOUNTER — Ambulatory Visit (INDEPENDENT_AMBULATORY_CARE_PROVIDER_SITE_OTHER): Payer: Medicare Other | Admitting: Cardiology

## 2018-11-30 ENCOUNTER — Encounter: Payer: Self-pay | Admitting: Cardiology

## 2018-11-30 VITALS — Ht 63.0 in | Wt 125.0 lb

## 2018-11-30 DIAGNOSIS — Z9189 Other specified personal risk factors, not elsewhere classified: Secondary | ICD-10-CM | POA: Diagnosis not present

## 2018-11-30 DIAGNOSIS — R5383 Other fatigue: Secondary | ICD-10-CM | POA: Diagnosis not present

## 2018-11-30 DIAGNOSIS — Z7189 Other specified counseling: Secondary | ICD-10-CM | POA: Diagnosis not present

## 2018-11-30 DIAGNOSIS — Z79899 Other long term (current) drug therapy: Secondary | ICD-10-CM

## 2018-11-30 DIAGNOSIS — I10 Essential (primary) hypertension: Secondary | ICD-10-CM | POA: Diagnosis not present

## 2018-11-30 DIAGNOSIS — R000 Tachycardia, unspecified: Secondary | ICD-10-CM | POA: Diagnosis not present

## 2018-11-30 MED ORDER — AMLODIPINE BESYLATE 10 MG PO TABS: 10.0000 mg | ORAL_TABLET | Freq: Every day | ORAL | 6 refills | Status: DC

## 2018-11-30 NOTE — Progress Notes (Signed)
Cardiology Office Note:    Date:  11/30/2018   ID:  Tiffany Fox, DOB [birth year], MRN [id number]  PCP:  Aretta Nip, MD  Cardiologist:  Buford Dresser, MD PhD  Referring MD: Aretta Nip, MD   No chief complaint on file.   History of Present Illness:    Tiffany Fox is a 85 y.o. female with a hx of insomnia, arthritis s/p recent knee replacement who is seen as a new consult at the request of Rankins, Bill Salinas, MD for the evaluation and management of tachycardia and orthostatic hypotension.  HPI: 6 weeks ago, had knee replacement. Was in generally good health prior. She was not on blood pressure medication prior to surgery. On the day of her surgery, she was noted to have very high blood pressure, started on 10 mg amlodipine at the time. Since discharge, knee healing has gone well, but about 2 weeks after surgery, started feeling nauseated, chills with shaking, exhaustion.   When she takes her blood pressure now, her BP is stable. Heart rate has gone from 90 to 120 bpm on occasion. Has also noted occasional difficulty getting a deep breath, not with exertion, just sitting still or lying in bed.   She has been unable to be as active and social as she was in the past. She feels very fatigued and just not interested in leaving the house or being active.   Before surgery, she was basically on only thyroid medication routinely. She was on blood pressure medication for several years around the death of her husband, but her blood pressure was low and she went off medication. Two years ago, her son passed away from cancer, and since then she has had intermittent elevations in her blood pressures but was not on medication prior to her surgery.   On chart review, noted BP reading from 158/78 several years ago, and she believes this is in line with her prior readings in the office.   On cholestyramine for history of severe diarrhea, not for dyslipidemia. Has pending  GI follow up for her intermittent diarrhea in the next few months.   Has been on fish oil OTC for a long time (not lovaza, just OTC). She isn't sure why, can't remember what her cholesterol numbers were. Was started on by her holistic doctor. Has been in discussion re: statin in the past, believe she also trialed something, but does not think it agreed with her body.  On current medication list, has lunesta, ativan, lexapro, and tizanidine. No longer taking tramadol or opiates. Takes amlodipine, lunesta, ativan, tizanidine at bedtime (cutting back on ativan). Takes thyroid first thing AM, then lexapro, omega 3, and questran once a day.  She overall is just very concerned as to why she has felt so out of balance since surgery. She would like to get back to her baseline.  Denies chest pain, shortness of breath at rest or with normal exertion. No PND, orthopnea, LE edema or unexpected weight gain. No syncope or palpitations.  Past Medical History:  Diagnosis Date  . Anxiety   . Arthritis   . Cervical spondylosis   . Depression   . Hypertension   . Hypothyroidism   . Leukocytopenia   . Osteoporosis     Past Surgical History:  Procedure Laterality Date  . APPENDECTOMY  2012  . EYE SURGERY Bilateral 2012  . FRACTURE SURGERY    . HIP FRACTURE SURGERY Left 05.2011  . JOINT REPLACEMENT    .  TOTAL KNEE ARTHROPLASTY Right 10/18/2018  . TOTAL KNEE ARTHROPLASTY Right 10/18/2018   Procedure: TOTAL KNEE ARTHROPLASTY;  Surgeon: Melrose Nakayama, MD;  Location: [facility];  Service: Orthopedics;  Laterality: Right;    Current Medications: Current Outpatient Medications on File Prior to Visit  Medication Sig  . cholestyramine (QUESTRAN) 4 g packet Take 4 g by mouth 2 (two) times daily.  Marland Kitchen escitalopram (LEXAPRO) 10 MG tablet Take 10 mg by mouth daily.  . eszopiclone (LUNESTA) 2 MG TABS tablet Take 2 mg by mouth at bedtime. Take immediately before bedtime  . LORazepam (ATIVAN) 0.5 MG tablet Take 0.5 mg  by mouth at bedtime.  . Multiple Vitamins-Minerals (HAIR/SKIN/NAILS PO) Take 1 tablet by mouth daily.  Marland Kitchen thyroid (ARMOUR) 60 MG tablet Take 60 mg by mouth daily before breakfast.   . tiZANidine (ZANAFLEX) 4 MG tablet Take 1 tablet (4 mg total) by mouth every 6 (six) hours as needed.   No current facility-administered medications on file prior to visit.      Allergies:   Lisinopril   Social History   Socioeconomic History  . Marital status: Widowed    Spouse name: Not on file  . Number of children: Not on file  . Years of education: Not on file  . Highest education level: Not on file  Occupational History  . Not on file  Social Needs  . Financial resource strain: Not on file  . Food insecurity:    Worry: Not on file    Inability: Not on file  . Transportation needs:    Medical: Not on file    Non-medical: Not on file  Tobacco Use  . Smoking status: Never Smoker  . Smokeless tobacco: Never Used  Substance and Sexual Activity  . Alcohol use: Yes    Alcohol/week: 2.0 standard drinks    Types: 2 Glasses of wine per week    Comment: wine   . Drug use: No  . Sexual activity: Not on file  Lifestyle  . Physical activity:    Days per week: Not on file    Minutes per session: Not on file  . Stress: Not on file  Relationships  . Social connections:    Talks on phone: Not on file    Gets together: Not on file    Attends religious service: Not on file    Active member of club or organization: Not on file    Attends meetings of clubs or organizations: Not on file    Relationship status: Not on file  Other Topics Concern  . Not on file  Social History Narrative  . Not on file     Family History: The patient's family history includes Heart disease in her brother, brother, and maternal grandfather; Stroke in her father.  ROS:   Please see the history of present illness.  Additional pertinent ROS:  Constitutional: Negative for fever, Positive for chills, shaking  HENT:  Negative for ear pain and hearing loss.   Eyes: Negative for loss of vision and eye pain.  Respiratory: Negative for cough, sputum, shortness of breath, wheezing.   Cardiovascular: See HPI. Gastrointestinal: Negative for abdominal pain, melena, and hematochezia. Positive for intermittent chronic diarrhea Genitourinary: Negative for dysuria and hematuria.  Musculoskeletal: Negative for falls and myalgias.  Skin: Negative for itching and rash.  Neurological: Negative for focal weakness, focal sensory changes and loss of consciousness.  Endo/Heme/Allergies: Does not bruise/bleed easily.    EKGs/Labs/Other Studies Reviewed:    The following  studies were reviewed today: Notes from Dr. Radene Ou' office  EKG:  EKG is personally reviewed.  The ekg ordered today demonstrates normal sinus rhythm at 83 bpm  Recent Labs: 11/07/2018: BUN 18; Creatinine, Ser 0.99; Hemoglobin 12.3; Platelets 321; Potassium 3.4; Sodium 135  Recent Lipid Panel No results found for: CHOL, TRIG, HDL, CHOLHDL, VLDL, LDLCALC, LDLDIRECT  Physical Exam:    VS:  Ht 5\' 3"  (1.6 m)   Wt 125 lb (56.7 kg)   BMI 22.14 kg/m    Blood pressure Lying 138/76, 72 Sitting 142/80, 72 Standing 150/70, 72  Wt Readings from Last 3 Encounters:  11/30/18 125 lb (56.7 kg)  10/11/18 128 lb 4.8 oz (58.2 kg)  11/13/15 126 lb 9.6 oz (57.4 kg)     GEN: Well nourished, well developed in no acute distress HEENT: Normal NECK: No JVD; No carotid bruits LYMPHATICS: No lymphadenopathy CARDIAC: regular rhythm, normal S1 and S2, no rubs, gallops. 2/6 SM. Radial and DP pulses 2+ bilaterally. RESPIRATORY:  Clear to auscultation without rales, wheezing or rhonchi  ABDOMEN: Soft, non-tender, non-distended MUSCULOSKELETAL:  No edema; No deformity  SKIN: Warm and dry NEUROLOGIC:  Alert and oriented x 3 PSYCHIATRIC:  Normal affect   ASSESSMENT:    1. Essential hypertension   2. Tachycardia   3. Medication management   4. At risk for  polypharmacy   5. Fatigue, unspecified type   6. Counseling on health promotion and disease prevention    PLAN:    Hypertension: judging by the notes I have available, it appears that she has had some degree of hypertension at least intermittently for the last several years. It is difficult for me to review all of her inpatient blood pressures, but there is note of one at least that was 409 systolic. Given this, she should be on some type of antihypertensive. Amlodipine is a good choice given that it is steady, minimal interaction risk. She denies significant edema. She has not had any GI side effects, though this is a risk. The chills/lightheadedness she has experienced, as well as sinus tachycardia, are not typical for amlodipine.  We discussed trialing another agent to see if she is less sensitive to it. I would not give her a beta blocker given her concerns of fatigue. I would also not give her a diuretic to avoid dehydration given prior orthostasis. An ARB would be an option for her with normal kidney function.  She would like to cut back on her medications, and we discussed the risk of interactions causing her symptoms. I did state that I could give recommendations on medications, though they are best monitored by her PCP. We spent >1 hour discussing options for adjustments today. Ultimately, we decided on the following:  -given her normal triglycerides and lack of outcomes data for fish oil, as well as the risk of GI upset/diarrhea with this, we will stop fish oil. Once that is stopped, she will trial stopping the cholestyramine for diarrhea. If her diarrhea is normalized off the fish oil, she may be able to take both the fish oil and cholestyramine off of her list.  -there is a high risk of interactions between antidepressants, muscle relaxers, sleep aids, benzodiazepines, and pain medications. She has come off of the narcotic medications, and she is weaning herself off ativan. This actually  may cause some mild reflex sinus tachycardia (both weaning of these medications and worsening pain/anxiety). Ultimately, if she can streamline her neuroactive medications, this will help avoid risk of side  effects. I deferred specific changes to her provider as this isn't my area of expertise, but I think aiming to minimize overlap (ie taking either lunesta or tizanidine, but not both, at bedtime) may be helpful  -she has not had any palpitations, and her ECGs here and at the PCP office are sinus tachycardia. We discussed at length that we don't treat sinus tachycardia but instead look at the underlying triggers for it. We discussed pain, anxiety, deconditioning, medication changes, and overall feeling poorly as drivers of elevated sinus heart rates. I suspect her change in activity level post surgery, as well as her feeling poorly, may be driving this. Currently she does not have symptoms suggestive of an arrythmia. If her heart rate persists, we could get a monitor to evaluate for diurnal variations and look for any patterns of tachycardia, as well as look for arrhythmia. She will consider this at follow up.  I encouraged her to continue to try to increase her activity as much as she feels up to it. She is not orthostatic today and is in fact still somewhat hypertensive. I would not try to be too aggressive with control of BP for now.   We also reviewed her elevated LDL, which she states is longstanding. She does not wish to discuss statins, and given her age, lack of symptoms and comorbidities, this is reasonable.  We will follow up in around 6 weeks to monitor her symptoms. If she does not improve, we will discuss stopping amlodipine and changing to losartan to see if she has any improvement.  Cardiac risk counseling and prevention recommendations: -recommend heart healthy/Mediterranean diet, with whole grains, fruits, vegetable, fish, lean meats, nuts, and olive oil. Limit salt. -recommend moderate  walking, 3-5 times/week for 30-50 minutes each session. Aim for at least 150 minutes.week. Goal should be pace of 3 miles/hours, or walking 1.5 miles in 30 minutes -recommend avoidance of tobacco products. Avoid excess alcohol.  Plan for follow up: 6 weeks.  Medication Adjustments/Labs and Tests Ordered: Current medicines are reviewed at length with the patient today.  Concerns regarding medicines are outlined above.  Orders Placed This Encounter  Procedures  . EKG 12-Lead   Meds ordered this encounter  Medications  . amLODipine (NORVASC) 10 MG tablet    Sig: Take 1 tablet (10 mg total) by mouth daily.    Dispense:  30 tablet    Refill:  6    Patient Instructions  Medication Instructions:  ) Stop omega 3 fish oil. One to two weeks after you stop, trial stopping the [place]. If diarrhea does not recur, do not need to take [place]. If diarrhea continues, then keep taking [place] and talk to GI doctors about options.  2) Good to cut out the ativan at bedtime. The combination of ativan, lunesta, and tizanidine at bedtime can interact with each other. If you can take EITHER the lunesta or the tizanidine at bedtime instead of both, that would be ideal.  3) I would like your blood pressure to be steady at around 140/90 or less. For now, continue the amlodipine 10 mg at bedtime. Measure your blood pressure several times/week and bring a log to your next appointment. If you can, bring your home blood pressure cuff as well.  If you need a refill on your cardiac medications before your next appointment, please call your pharmacy.   Lab work: None  Testing/Procedures: None  Follow-Up: At [facility], you and your health needs are our priority.  As part  of our continuing mission to provide you with exceptional heart care, we have created designated Provider Care Teams.  These Care Teams include your primary Cardiologist (physician) and Advanced Practice Providers (APPs -  Physician  Assistants and Nurse Practitioners) who all work together to provide you with the care you need, when you need it. You will need a follow up appointment in 6 weeks.  Please call our office 2 months in advance to schedule this appointment.  You may see Dr. Harrell Gave or one of the following Advanced Practice Providers on your designated Care Team:   Rosaria Ferries, PA-C . Jory Sims, DNP, ANP      Signed, Buford Dresser, MD PhD 11/30/2018 [place]

## 2018-11-30 NOTE — Patient Instructions (Addendum)
Medication Instructions:  ) Stop omega 3 fish oil. One to two weeks after you stop, trial stopping the [place]. If diarrhea does not recur, do not need to take [place]. If diarrhea continues, then keep taking [place] and talk to GI doctors about options.  2) Good to cut out the ativan at bedtime. The combination of ativan, lunesta, and tizanidine at bedtime can interact with each other. If you can take EITHER the lunesta or the tizanidine at bedtime instead of both, that would be ideal.  3) I would like your blood pressure to be steady at around 140/90 or less. For now, continue the amlodipine 10 mg at bedtime. Measure your blood pressure several times/week and bring a log to your next appointment. If you can, bring your home blood pressure cuff as well.  If you need a refill on your cardiac medications before your next appointment, please call your pharmacy.   Lab work: None  Testing/Procedures: None  Follow-Up: At [facility], you and your health needs are our priority.  As part of our continuing mission to provide you with exceptional heart care, we have created designated Provider Care Teams.  These Care Teams include your primary Cardiologist (physician) and Advanced Practice Providers (APPs -  Physician Assistants and Nurse Practitioners) who all work together to provide you with the care you need, when you need it. You will need a follow up appointment in 6 weeks.  Please call our office 2 months in advance to schedule this appointment.  You may see Dr. Harrell Gave or one of the following Advanced Practice Providers on your designated Care Team:   Rosaria Ferries, PA-C . Jory Sims, DNP, ANP

## 2018-12-02 DIAGNOSIS — Z96651 Presence of right artificial knee joint: Secondary | ICD-10-CM | POA: Diagnosis not present

## 2018-12-02 DIAGNOSIS — M25661 Stiffness of right knee, not elsewhere classified: Secondary | ICD-10-CM | POA: Diagnosis not present

## 2018-12-06 DIAGNOSIS — Z96651 Presence of right artificial knee joint: Secondary | ICD-10-CM | POA: Diagnosis not present

## 2018-12-06 DIAGNOSIS — M25661 Stiffness of right knee, not elsewhere classified: Secondary | ICD-10-CM | POA: Diagnosis not present

## 2018-12-08 DIAGNOSIS — M25661 Stiffness of right knee, not elsewhere classified: Secondary | ICD-10-CM | POA: Diagnosis not present

## 2018-12-08 DIAGNOSIS — Z96651 Presence of right artificial knee joint: Secondary | ICD-10-CM | POA: Diagnosis not present

## 2018-12-12 DIAGNOSIS — Z96651 Presence of right artificial knee joint: Secondary | ICD-10-CM | POA: Diagnosis not present

## 2018-12-12 DIAGNOSIS — M25661 Stiffness of right knee, not elsewhere classified: Secondary | ICD-10-CM | POA: Diagnosis not present

## 2018-12-14 DIAGNOSIS — Z96651 Presence of right artificial knee joint: Secondary | ICD-10-CM | POA: Diagnosis not present

## 2018-12-14 DIAGNOSIS — M25661 Stiffness of right knee, not elsewhere classified: Secondary | ICD-10-CM | POA: Diagnosis not present

## 2018-12-15 DIAGNOSIS — H40003 Preglaucoma, unspecified, bilateral: Secondary | ICD-10-CM | POA: Diagnosis not present

## 2018-12-19 DIAGNOSIS — Z96651 Presence of right artificial knee joint: Secondary | ICD-10-CM | POA: Diagnosis not present

## 2018-12-19 DIAGNOSIS — M25661 Stiffness of right knee, not elsewhere classified: Secondary | ICD-10-CM | POA: Diagnosis not present

## 2018-12-21 ENCOUNTER — Ambulatory Visit: Payer: Medicare Other | Admitting: Cardiology

## 2018-12-26 DIAGNOSIS — M25661 Stiffness of right knee, not elsewhere classified: Secondary | ICD-10-CM | POA: Diagnosis not present

## 2018-12-26 DIAGNOSIS — Z96651 Presence of right artificial knee joint: Secondary | ICD-10-CM | POA: Diagnosis not present

## 2018-12-29 DIAGNOSIS — M25661 Stiffness of right knee, not elsewhere classified: Secondary | ICD-10-CM | POA: Diagnosis not present

## 2018-12-29 DIAGNOSIS — Z96651 Presence of right artificial knee joint: Secondary | ICD-10-CM | POA: Diagnosis not present

## 2019-01-03 DIAGNOSIS — Z96651 Presence of right artificial knee joint: Secondary | ICD-10-CM | POA: Diagnosis not present

## 2019-01-03 DIAGNOSIS — M25661 Stiffness of right knee, not elsewhere classified: Secondary | ICD-10-CM | POA: Diagnosis not present

## 2019-01-06 ENCOUNTER — Telehealth: Payer: Self-pay

## 2019-01-06 NOTE — Telephone Encounter (Signed)
Virtual Visit Pre-Appointment Phone Call  Tiffany Fox has been deemed a candidate for a follow-up tele-health visit to limit community exposure during the Covid-19 pandemic. I spoke with the patient via phone to ensure availability of phone/video source, confirm preferred email & phone number, and discuss instructions and expectations.  I reminded Tiffany Fox to be prepared with any vital sign and/or heart rhythm information that could potentially be obtained via home monitoring, at the time of her visit. I reminded Tiffany Fox to expect a phone call at the time of her visit if her visit.  Did the patient verbally acknowledge consent to treatment? Patient provided verbal consent.  Tiffany Fox, [place] 01/06/2019 12:45 PM   DOWNLOADING THE [place]  - If Apple, go to [employer] and type in WebEx in the search bar. [place] [employer], the blue/green circle. The app is free but as with any other app downloads, their phone may require them to verify saved payment information or Apple password. The patient does NOT have to create an account.  - If Android, ask patient to go to [employer] and type in WebEx in the search bar. [place] [employer], the blue/green circle. The app is free but as with any other app downloads, their phone may require them to verify saved payment information or Android password. The patient does NOT have to create an account.   CONSENT FOR TELE-HEALTH VISIT - PLEASE REVIEW  I hereby voluntarily request, consent and authorize [facility] and its employed or contracted physicians, physician assistants, nurse practitioners or other licensed health care professionals (the Practitioner), to provide me with telemedicine health care services (the "Services") as deemed necessary by the treating Practitioner. I acknowledge and consent to receive the Services by the Practitioner via telemedicine. I understand that  the telemedicine visit will involve communicating with the Practitioner through live audiovisual communication technology and the disclosure of certain medical information by electronic transmission. I acknowledge that I have been given the opportunity to request an in-person assessment or other available alternative prior to the telemedicine visit and am voluntarily participating in the telemedicine visit.  I understand that I have the right to withhold or withdraw my consent to the use of telemedicine in the course of my care at any time, without affecting my right to future care or treatment, and that the Practitioner or I may terminate the telemedicine visit at any time. I understand that I have the right to inspect all information obtained and/or recorded in the course of the telemedicine visit and may receive copies of available information for a reasonable fee.  I understand that some of the potential risks of receiving the Services via telemedicine include:  Marland Kitchen Delay or interruption in medical evaluation due to technological equipment failure or disruption; . Information transmitted may not be sufficient (e.g. poor resolution of images) to allow for appropriate medical decision making by the Practitioner; and/or  . In rare instances, security protocols could fail, causing a breach of personal health information.  Furthermore, I acknowledge that it is my responsibility to provide information about my medical history, conditions and care that is complete and accurate to the best of my ability. I acknowledge that Practitioner's advice, recommendations, and/or decision may be based on factors not within their control, such as incomplete or inaccurate data provided by me or distortions of diagnostic images or specimens that may result from electronic transmissions. I understand that the practice  of medicine is not an Chief Strategy Officer and that Practitioner makes no warranties or guarantees regarding treatment  outcomes. I acknowledge that I will receive a copy of this consent concurrently upon execution via email to the email address I last provided but may also request a printed copy by calling the office of [facility].    I understand that my insurance will be billed for this visit.   I have read or had this consent read to me. . I understand the contents of this consent, which adequately explains the benefits and risks of the Services being provided via telemedicine.  . I have been provided ample opportunity to ask questions regarding this consent and the Services and have had my questions answered to my satisfaction. . I give my informed consent for the services to be provided through the use of telemedicine in my medical care  By participating in this telemedicine visit I agree to the above.

## 2019-01-06 NOTE — Telephone Encounter (Signed)
lmtcb

## 2019-01-10 ENCOUNTER — Telehealth (INDEPENDENT_AMBULATORY_CARE_PROVIDER_SITE_OTHER): Payer: Medicare Other | Admitting: Cardiology

## 2019-01-10 ENCOUNTER — Encounter: Payer: Self-pay | Admitting: Cardiology

## 2019-01-10 DIAGNOSIS — T887XXA Unspecified adverse effect of drug or medicament, initial encounter: Secondary | ICD-10-CM | POA: Diagnosis not present

## 2019-01-10 DIAGNOSIS — I479 Paroxysmal tachycardia, unspecified: Secondary | ICD-10-CM | POA: Diagnosis not present

## 2019-01-10 DIAGNOSIS — I10 Essential (primary) hypertension: Secondary | ICD-10-CM | POA: Diagnosis not present

## 2019-01-10 NOTE — Progress Notes (Signed)
Virtual Visit via Telephone Note   This visit type was conducted due to national recommendations for restrictions regarding the COVID-19 Pandemic (e.g. social distancing) in an effort to limit this patient's exposure and mitigate transmission in our community.  Due to her co-morbid illnesses, this patient is at least at moderate risk for complications without adequate follow up.  This format is felt to be most appropriate for this patient at this time.  The patient did not have access to video technology/had technical difficulties with video requiring transitioning to audio format only (telephone).  All issues noted in this document were discussed and addressed.  No physical exam could be performed with this format.  Please refer to the patient's chart for her  consent to telehealth for [facility]   Evaluation Performed:  Follow-up visit  Date:  01/10/2019   ID:  Tiffany Fox, DOB Feb 27, 1934, MRN [id number]  Patient Location: Home  Provider Location: Home  PCP:  Aretta Nip, MD  Cardiologist:  Buford Dresser, MD  Electrophysiologist:  None   Chief Complaint:  Follow up  History of Present Illness:    Tiffany Fox is a 85 y.o. female who presents via audio/video conferencing for a telehealth visit today.    The patient does not have symptoms concerning for COVID-19 infection (fever, chills, cough, or new shortness of breath).   Initial consult 11/30/18 for tachycardia and orthostatic hypotension after knee surgery. We also discussed hypertension. Plan at that time was to stop fish oil (had chronic diarrhea), if diarrhea improved trial stopping cholestyramine. Also discussed her medication interaction risk, referred her back to PCP to review these. Discussed gradual increase in activity, monitoring heart rate. Also discussed LDL, but she is not interested in statins, and given her age this is reasonable based on her preference.  Unable to use video chat today  due to technical limitations and patient preference. Visit conducted by telephone.  Had ups and downs this month. Some days felt more normal, others with racing heart and fatigue. Thinks she is trending more towards normal, only one day in the last few weeks was a bad day  HR 68-188 throughout the month. Highest day 998 systolic, pulse 338 which was the day she felt poorly.  Today 68, 128/66. Normally still running 250N systolic. Usually takes at different times of day. Taking amlodipine at night.   Diarrhea: off fish oil, had cholestyramine diarrhea part of the time. Diarrhea gradually improved, stopped cholestyramine about a week ago. Doing well, having regular BM with occasional looser stools.  Past Medical History:  Diagnosis Date  . Anxiety   . Arthritis   . Cervical spondylosis   . Depression   . Hypertension   . Hypothyroidism   . Leukocytopenia   . Osteoporosis    Past Surgical History:  Procedure Laterality Date  . APPENDECTOMY  2012  . EYE SURGERY Bilateral 2012  . FRACTURE SURGERY    . HIP FRACTURE SURGERY Left 05.2011  . JOINT REPLACEMENT    . TOTAL KNEE ARTHROPLASTY Right 10/18/2018  . TOTAL KNEE ARTHROPLASTY Right 10/18/2018   Procedure: TOTAL KNEE ARTHROPLASTY;  Surgeon: Melrose Nakayama, MD;  Location: [place];  Service: Orthopedics;  Laterality: Right;     Current Meds  Medication Sig  . amLODipine (NORVASC) 10 MG tablet Take 1 tablet (10 mg total) by mouth daily.  Marland Kitchen escitalopram (LEXAPRO) 10 MG tablet Take 10 mg by mouth daily.  . eszopiclone (LUNESTA) 2 MG TABS tablet Take 2  mg by mouth at bedtime. Take immediately before bedtime  . Multiple Vitamins-Minerals (HAIR/SKIN/NAILS PO) Take 1 tablet by mouth daily.  Marland Kitchen thyroid (ARMOUR) 60 MG tablet Take 60 mg by mouth daily before breakfast.   . tiZANidine (ZANAFLEX) 4 MG tablet Take 1 tablet (4 mg total) by mouth every 6 (six) hours as needed.  . [DISCONTINUED] cholestyramine (QUESTRAN) 4 g packet Take 4 g by mouth 2  (two) times daily.  . [DISCONTINUED] LORazepam (ATIVAN) 0.5 MG tablet Take 0.5 mg by mouth at bedtime.     Allergies:   Lisinopril   Social History   Tobacco Use  . Smoking status: Never Smoker  . Smokeless tobacco: Never Used  Substance Use Topics  . Alcohol use: Yes    Alcohol/week: 2.0 standard drinks    Types: 2 Glasses of wine per week    Comment: wine   . Drug use: No     Family Hx: The patient's family history includes Heart disease in her brother, brother, and maternal grandfather; Stroke in her father.  ROS:   Please see the history of present illness.    All other systems reviewed and are negative.   Prior CV studies:   The following studies were reviewed today: Prior notes and studies reviewed  Labs/Other Tests and Data Reviewed:    EKG:  An ECG dated 11/30/18 was personally reviewed today and demonstrated:  NSR  Recent Labs: 11/07/2018: BUN 18; Creatinine, Ser 0.99; Hemoglobin 12.3; Platelets 321; Potassium 3.4; Sodium 135   Recent Lipid Panel No results found for: CHOL, TRIG, HDL, CHOLHDL, LDLCALC, LDLDIRECT  Wt Readings from Last 3 Encounters:  11/30/18 125 lb (56.7 kg)  10/11/18 128 lb 4.8 oz (58.2 kg)  11/13/15 126 lb 9.6 oz (57.4 kg)     Objective:    Vital Signs:  BP 128/66   Pulse 68    Speaking easily, no shortness of breath or distress  ASSESSMENT & PLAN:    Hypertension: excellent control today, but has been trending high -continue to monitor -continue amlodipine -instructed on how to properly measure blood pressure -will call if starts to rise  Tachycardia, paroxysmal: getting less frequent. Only one episode in recent weeks. Continue to monitor  Medication management and side effects: diarrhea has improved off fish oil, and now she is doing well even off cholestyramine. Continue to monitor. If severe diarrhea recurs, recommended discussing with her GI physician.  COVID-19 Education: The signs and symptoms of COVID-19 were  discussed with the patient and how to seek care for testing (follow up with PCP or arrange E-visit).  The importance of social distancing was discussed today.  Time:   Today, I have spent 25 minutes with the patient with telehealth technology discussing the above problems.    Patient Instructions  Medication Instructions:  Your Physician recommend you continue on your current medication as directed.    If you need a refill on your cardiac medications before your next appointment, please call your pharmacy.   Lab work: None  Testing/Procedures: None  Follow-Up: Your physician recommends that you schedule a follow-up appointment in 3 months with Dr. Harrell Gave.      Medication Adjustments/Labs and Tests Ordered: Current medicines are reviewed at length with the patient today.  Concerns regarding medicines are outlined above.  Tests Ordered: No orders of the defined types were placed in this encounter.  Medication Changes: No orders of the defined types were placed in this encounter.   Disposition:  Follow up in  3 month(s)  Signed, Buford Dresser, MD  01/10/2019 10:20 AM    [facility]

## 2019-01-10 NOTE — Patient Instructions (Signed)
Medication Instructions:  Your Physician recommend you continue on your current medication as directed.    If you need a refill on your cardiac medications before your next appointment, please call your pharmacy.   Lab work: None  Testing/Procedures: None  Follow-Up: Your physician recommends that you schedule a follow-up appointment in 3 months with Dr. Harrell Gave.

## 2019-01-16 DIAGNOSIS — Z96651 Presence of right artificial knee joint: Secondary | ICD-10-CM | POA: Diagnosis not present

## 2019-02-14 DIAGNOSIS — M25552 Pain in left hip: Secondary | ICD-10-CM | POA: Diagnosis not present

## 2019-02-22 DIAGNOSIS — M5137 Other intervertebral disc degeneration, lumbosacral region: Secondary | ICD-10-CM | POA: Diagnosis not present

## 2019-02-22 DIAGNOSIS — M5414 Radiculopathy, thoracic region: Secondary | ICD-10-CM | POA: Diagnosis not present

## 2019-02-22 DIAGNOSIS — M5416 Radiculopathy, lumbar region: Secondary | ICD-10-CM | POA: Diagnosis not present

## 2019-02-22 DIAGNOSIS — M9902 Segmental and somatic dysfunction of thoracic region: Secondary | ICD-10-CM | POA: Diagnosis not present

## 2019-02-22 DIAGNOSIS — M9904 Segmental and somatic dysfunction of sacral region: Secondary | ICD-10-CM | POA: Diagnosis not present

## 2019-02-22 DIAGNOSIS — M9903 Segmental and somatic dysfunction of lumbar region: Secondary | ICD-10-CM | POA: Diagnosis not present

## 2019-02-24 DIAGNOSIS — M5414 Radiculopathy, thoracic region: Secondary | ICD-10-CM | POA: Diagnosis not present

## 2019-02-24 DIAGNOSIS — M5416 Radiculopathy, lumbar region: Secondary | ICD-10-CM | POA: Diagnosis not present

## 2019-02-24 DIAGNOSIS — M9904 Segmental and somatic dysfunction of sacral region: Secondary | ICD-10-CM | POA: Diagnosis not present

## 2019-02-24 DIAGNOSIS — M9903 Segmental and somatic dysfunction of lumbar region: Secondary | ICD-10-CM | POA: Diagnosis not present

## 2019-02-24 DIAGNOSIS — M5137 Other intervertebral disc degeneration, lumbosacral region: Secondary | ICD-10-CM | POA: Diagnosis not present

## 2019-02-24 DIAGNOSIS — M9902 Segmental and somatic dysfunction of thoracic region: Secondary | ICD-10-CM | POA: Diagnosis not present

## 2019-02-28 DIAGNOSIS — M5137 Other intervertebral disc degeneration, lumbosacral region: Secondary | ICD-10-CM | POA: Diagnosis not present

## 2019-02-28 DIAGNOSIS — M9904 Segmental and somatic dysfunction of sacral region: Secondary | ICD-10-CM | POA: Diagnosis not present

## 2019-02-28 DIAGNOSIS — M5414 Radiculopathy, thoracic region: Secondary | ICD-10-CM | POA: Diagnosis not present

## 2019-02-28 DIAGNOSIS — M9903 Segmental and somatic dysfunction of lumbar region: Secondary | ICD-10-CM | POA: Diagnosis not present

## 2019-02-28 DIAGNOSIS — M9902 Segmental and somatic dysfunction of thoracic region: Secondary | ICD-10-CM | POA: Diagnosis not present

## 2019-02-28 DIAGNOSIS — M5416 Radiculopathy, lumbar region: Secondary | ICD-10-CM | POA: Diagnosis not present

## 2019-03-01 DIAGNOSIS — M9903 Segmental and somatic dysfunction of lumbar region: Secondary | ICD-10-CM | POA: Diagnosis not present

## 2019-03-01 DIAGNOSIS — M9904 Segmental and somatic dysfunction of sacral region: Secondary | ICD-10-CM | POA: Diagnosis not present

## 2019-03-01 DIAGNOSIS — M5414 Radiculopathy, thoracic region: Secondary | ICD-10-CM | POA: Diagnosis not present

## 2019-03-01 DIAGNOSIS — M5416 Radiculopathy, lumbar region: Secondary | ICD-10-CM | POA: Diagnosis not present

## 2019-03-01 DIAGNOSIS — M5137 Other intervertebral disc degeneration, lumbosacral region: Secondary | ICD-10-CM | POA: Diagnosis not present

## 2019-03-01 DIAGNOSIS — M9902 Segmental and somatic dysfunction of thoracic region: Secondary | ICD-10-CM | POA: Diagnosis not present

## 2019-03-03 DIAGNOSIS — M5414 Radiculopathy, thoracic region: Secondary | ICD-10-CM | POA: Diagnosis not present

## 2019-03-03 DIAGNOSIS — M9904 Segmental and somatic dysfunction of sacral region: Secondary | ICD-10-CM | POA: Diagnosis not present

## 2019-03-03 DIAGNOSIS — M9903 Segmental and somatic dysfunction of lumbar region: Secondary | ICD-10-CM | POA: Diagnosis not present

## 2019-03-03 DIAGNOSIS — M5416 Radiculopathy, lumbar region: Secondary | ICD-10-CM | POA: Diagnosis not present

## 2019-03-03 DIAGNOSIS — M5137 Other intervertebral disc degeneration, lumbosacral region: Secondary | ICD-10-CM | POA: Diagnosis not present

## 2019-03-03 DIAGNOSIS — M9902 Segmental and somatic dysfunction of thoracic region: Secondary | ICD-10-CM | POA: Diagnosis not present

## 2019-03-06 DIAGNOSIS — M9904 Segmental and somatic dysfunction of sacral region: Secondary | ICD-10-CM | POA: Diagnosis not present

## 2019-03-06 DIAGNOSIS — M9902 Segmental and somatic dysfunction of thoracic region: Secondary | ICD-10-CM | POA: Diagnosis not present

## 2019-03-06 DIAGNOSIS — M5414 Radiculopathy, thoracic region: Secondary | ICD-10-CM | POA: Diagnosis not present

## 2019-03-06 DIAGNOSIS — M5137 Other intervertebral disc degeneration, lumbosacral region: Secondary | ICD-10-CM | POA: Diagnosis not present

## 2019-03-06 DIAGNOSIS — M9903 Segmental and somatic dysfunction of lumbar region: Secondary | ICD-10-CM | POA: Diagnosis not present

## 2019-03-06 DIAGNOSIS — M5416 Radiculopathy, lumbar region: Secondary | ICD-10-CM | POA: Diagnosis not present

## 2019-03-08 DIAGNOSIS — M9903 Segmental and somatic dysfunction of lumbar region: Secondary | ICD-10-CM | POA: Diagnosis not present

## 2019-03-08 DIAGNOSIS — M5137 Other intervertebral disc degeneration, lumbosacral region: Secondary | ICD-10-CM | POA: Diagnosis not present

## 2019-03-08 DIAGNOSIS — M9904 Segmental and somatic dysfunction of sacral region: Secondary | ICD-10-CM | POA: Diagnosis not present

## 2019-03-08 DIAGNOSIS — M5414 Radiculopathy, thoracic region: Secondary | ICD-10-CM | POA: Diagnosis not present

## 2019-03-08 DIAGNOSIS — M5416 Radiculopathy, lumbar region: Secondary | ICD-10-CM | POA: Diagnosis not present

## 2019-03-08 DIAGNOSIS — M9902 Segmental and somatic dysfunction of thoracic region: Secondary | ICD-10-CM | POA: Diagnosis not present

## 2019-03-13 DIAGNOSIS — M9902 Segmental and somatic dysfunction of thoracic region: Secondary | ICD-10-CM | POA: Diagnosis not present

## 2019-03-13 DIAGNOSIS — M5414 Radiculopathy, thoracic region: Secondary | ICD-10-CM | POA: Diagnosis not present

## 2019-03-13 DIAGNOSIS — M9904 Segmental and somatic dysfunction of sacral region: Secondary | ICD-10-CM | POA: Diagnosis not present

## 2019-03-13 DIAGNOSIS — M5137 Other intervertebral disc degeneration, lumbosacral region: Secondary | ICD-10-CM | POA: Diagnosis not present

## 2019-03-13 DIAGNOSIS — M9903 Segmental and somatic dysfunction of lumbar region: Secondary | ICD-10-CM | POA: Diagnosis not present

## 2019-03-13 DIAGNOSIS — M5416 Radiculopathy, lumbar region: Secondary | ICD-10-CM | POA: Diagnosis not present

## 2019-03-15 DIAGNOSIS — R768 Other specified abnormal immunological findings in serum: Secondary | ICD-10-CM | POA: Diagnosis not present

## 2019-03-15 DIAGNOSIS — K580 Irritable bowel syndrome with diarrhea: Secondary | ICD-10-CM | POA: Diagnosis not present

## 2019-03-20 DIAGNOSIS — M9904 Segmental and somatic dysfunction of sacral region: Secondary | ICD-10-CM | POA: Diagnosis not present

## 2019-03-20 DIAGNOSIS — M9902 Segmental and somatic dysfunction of thoracic region: Secondary | ICD-10-CM | POA: Diagnosis not present

## 2019-03-20 DIAGNOSIS — M5416 Radiculopathy, lumbar region: Secondary | ICD-10-CM | POA: Diagnosis not present

## 2019-03-20 DIAGNOSIS — M5137 Other intervertebral disc degeneration, lumbosacral region: Secondary | ICD-10-CM | POA: Diagnosis not present

## 2019-03-20 DIAGNOSIS — M9903 Segmental and somatic dysfunction of lumbar region: Secondary | ICD-10-CM | POA: Diagnosis not present

## 2019-03-20 DIAGNOSIS — M5414 Radiculopathy, thoracic region: Secondary | ICD-10-CM | POA: Diagnosis not present

## 2019-04-06 DIAGNOSIS — M9902 Segmental and somatic dysfunction of thoracic region: Secondary | ICD-10-CM | POA: Diagnosis not present

## 2019-04-06 DIAGNOSIS — M5137 Other intervertebral disc degeneration, lumbosacral region: Secondary | ICD-10-CM | POA: Diagnosis not present

## 2019-04-06 DIAGNOSIS — M9904 Segmental and somatic dysfunction of sacral region: Secondary | ICD-10-CM | POA: Diagnosis not present

## 2019-04-06 DIAGNOSIS — M5416 Radiculopathy, lumbar region: Secondary | ICD-10-CM | POA: Diagnosis not present

## 2019-04-06 DIAGNOSIS — M9903 Segmental and somatic dysfunction of lumbar region: Secondary | ICD-10-CM | POA: Diagnosis not present

## 2019-04-06 DIAGNOSIS — M5414 Radiculopathy, thoracic region: Secondary | ICD-10-CM | POA: Diagnosis not present

## 2019-04-17 ENCOUNTER — Telehealth: Payer: Self-pay | Admitting: Cardiology

## 2019-04-17 DIAGNOSIS — M545 Low back pain: Secondary | ICD-10-CM | POA: Diagnosis not present

## 2019-04-17 DIAGNOSIS — Z96651 Presence of right artificial knee joint: Secondary | ICD-10-CM | POA: Diagnosis not present

## 2019-04-17 NOTE — Telephone Encounter (Signed)
LVM, reminding pt of her appt with Dr Harrell Gave on 04-18-19.

## 2019-04-18 ENCOUNTER — Ambulatory Visit (INDEPENDENT_AMBULATORY_CARE_PROVIDER_SITE_OTHER): Payer: Medicare Other | Admitting: Cardiology

## 2019-04-18 ENCOUNTER — Encounter: Payer: Self-pay | Admitting: Cardiology

## 2019-04-18 VITALS — BP 139/79 | HR 99 | Ht 63.0 in | Wt 127.2 lb

## 2019-04-18 DIAGNOSIS — R55 Syncope and collapse: Secondary | ICD-10-CM | POA: Diagnosis not present

## 2019-04-18 DIAGNOSIS — R002 Palpitations: Secondary | ICD-10-CM | POA: Diagnosis not present

## 2019-04-18 DIAGNOSIS — R600 Localized edema: Secondary | ICD-10-CM

## 2019-04-18 DIAGNOSIS — I10 Essential (primary) hypertension: Secondary | ICD-10-CM | POA: Diagnosis not present

## 2019-04-18 NOTE — Patient Instructions (Signed)

## 2019-04-18 NOTE — Progress Notes (Signed)
Cardiology Office Note:    Date:  04/18/2019   ID:  Tiffany Fox, DOB 1933-11-12, MRN [id number]  PCP:  Aretta Nip, MD  Cardiologist:  Buford Dresser, MD PhD  Referring MD: Aretta Nip, MD   No chief complaint on file.   History of Present Illness:    Tiffany Fox is a 85 y.o. female with a hx of insomnia, arthritis s/p knee replacement, hypertension.  Initial consult 11/30/18 for tachycardia and orthostatic hypotension after knee surgery. We also discussed hypertension. Plan at that time was to stop fish oil (had chronic diarrhea), if diarrhea improved trial stopping cholestyramine. Also discussed her medication interaction risk, referred her back to PCP to review these. Discussed gradual increase in activity, monitoring heart rate. Also discussed LDL, but she is not interested in statins, and given her age this is reasonable based on her preference.  Today: Has had mild swelling issues from knees to toes, sometimes feels like she is swollen in her face even on occasion. Weight is up 4-5 lbs, steadily up over the last few months. Stopped tizanidine yesterday as she noted it can cause swelling.  Still having occasional dizzyness. Has occasional sensations like her heart is racing. Has had two episodes of syncope, both in the parking lot outside of the grocery store. Felt dizzyness coming on and then passed out. Most recent was 2 weeks ago, first 4 weeks. Doesn't think she was overly hot but it was outdoors in July. No postictal confusion. No chest pain, SOB prior. Similar to episode she had about 7 years ago.   Brings in a log of Bps with her, checking 2-4 times/week. Most in 202R systolic, range 427-062/37-62, most diastolic in the 83T. HR 70s-90s  Denies chest pain, shortness of breath at rest or with normal exertion. No PND, orthopnea.  Past Medical History:  Diagnosis Date  . Anxiety   . Arthritis   . Cervical spondylosis   . Depression   .  Hypertension   . Hypothyroidism   . Leukocytopenia   . Osteoporosis     Past Surgical History:  Procedure Laterality Date  . APPENDECTOMY  2012  . EYE SURGERY Bilateral 2012  . FRACTURE SURGERY    . HIP FRACTURE SURGERY Left 05.2011  . JOINT REPLACEMENT    . TOTAL KNEE ARTHROPLASTY Right 10/18/2018  . TOTAL KNEE ARTHROPLASTY Right 10/18/2018   Procedure: TOTAL KNEE ARTHROPLASTY;  Surgeon: Melrose Nakayama, MD;  Location: [place];  Service: Orthopedics;  Laterality: Right;    Current Medications: Current Outpatient Medications on File Prior to Visit  Medication Sig  . amLODipine (NORVASC) 10 MG tablet Take 1 tablet (10 mg total) by mouth daily.  Marland Kitchen escitalopram (LEXAPRO) 10 MG tablet Take 10 mg by mouth daily.  . eszopiclone (LUNESTA) 2 MG TABS tablet Take 2 mg by mouth at bedtime. Take immediately before bedtime  . Multiple Vitamins-Minerals (HAIR/SKIN/NAILS PO) Take 1 tablet by mouth daily.  Marland Kitchen thyroid (ARMOUR) 60 MG tablet Take 60 mg by mouth daily before breakfast.    No current facility-administered medications on file prior to visit.      Allergies:   Lisinopril   Social History   Socioeconomic History  . Marital status: Widowed    Spouse name: Not on file  . Number of children: Not on file  . Years of education: Not on file  . Highest education level: Not on file  Occupational History  . Not on file  Social Needs  .  Financial resource strain: Not on file  . Food insecurity    Worry: Not on file    Inability: Not on file  . Transportation needs    Medical: Not on file    Non-medical: Not on file  Tobacco Use  . Smoking status: Never Smoker  . Smokeless tobacco: Never Used  Substance and Sexual Activity  . Alcohol use: Yes    Alcohol/week: 2.0 standard drinks    Types: 2 Glasses of wine per week    Comment: wine   . Drug use: No  . Sexual activity: Not on file  Lifestyle  . Physical activity    Days per week: Not on file    Minutes per session: Not on file   . Stress: Not on file  Relationships  . Social Herbalist on phone: Not on file    Gets together: Not on file    Attends religious service: Not on file    Active member of club or organization: Not on file    Attends meetings of clubs or organizations: Not on file    Relationship status: Not on file  Other Topics Concern  . Not on file  Social History Narrative  . Not on file     Family History: The patient's family history includes Heart disease in her brother, brother, and maternal grandfather; Stroke in her father.  ROS:   Please see the history of present illness.  Additional pertinent ROS: Constitutional: Negative for chills, fever, night sweats, unintentional weight loss  HENT: Negative for ear pain and hearing loss.   Eyes: Negative for loss of vision and eye pain.  Respiratory: Negative for cough, sputum, wheezing.   Cardiovascular: See HPI. Gastrointestinal: Negative for abdominal pain, melena, and hematochezia.  Genitourinary: Negative for dysuria and hematuria.  Musculoskeletal: Negative for falls and myalgias.  Skin: Negative for itching and rash.  Neurological: Negative for focal weakness, focal sensory changes Endo/Heme/Allergies: Does not bruise/bleed easily.   EKGs/Labs/Other Studies Reviewed:    The following studies were reviewed today: Notes from Dr. Radene Ou' office  EKG:  EKG is personally reviewed.  The ekg ordered 11/30/18 demonstrates normal sinus rhythm at 83 bpm  Recent Labs: 11/07/2018: BUN 18; Creatinine, Ser 0.99; Hemoglobin 12.3; Platelets 321; Potassium 3.4; Sodium 135  Recent Lipid Panel No results found for: CHOL, TRIG, HDL, CHOLHDL, VLDL, LDLCALC, LDLDIRECT  Physical Exam:    VS:  BP 139/79   Pulse 99   Ht 5\' 3"  (1.6 m)   Wt 127 lb 3.2 oz (57.7 kg)   SpO2 97%   BMI 22.53 kg/m     Wt Readings from Last 3 Encounters:  04/18/19 127 lb 3.2 oz (57.7 kg)  11/30/18 125 lb (56.7 kg)  10/11/18 128 lb 4.8 oz (58.2 kg)    GEN:  Well nourished, well developed in no acute distress HEENT: Normal, moist mucous membranes NECK: No JVD CARDIAC: regular rhythm, normal S1 and S2, no murmurs, rubs, gallops.  VASCULAR: Radial and DP pulses 2+ bilaterally. No carotid bruits RESPIRATORY:  Clear to auscultation without rales, wheezing or rhonchi  ABDOMEN: Soft, non-tender, non-distended MUSCULOSKELETAL:  Ambulates independently SKIN: Warm and dry, trivial bilateral LE edema NEUROLOGIC:  Alert and oriented x 3. No focal neuro deficits noted. PSYCHIATRIC:  Normal affect   ASSESSMENT:    1. Syncope and collapse   2. Bilateral leg edema   3. Essential hypertension   4. Heart palpitations    PLAN:    Syncope:  two episodes, both outside in the parking lot in warm weather. She had a prodrome of lightheadedness/dizzyness. Similar to prior distant events. Likely reflex syncope. -not related to sensation of palpitations -instructed on avoiding dehydration -if she has another event, would get monitor and echo. She will contact us if this occurs  Hypertension:  -BP log generally well controlled -continue amlodipine  LE edema: very mild today. She just stopped tizanidine to see if it improves. May also be from amlodipine. -instructed on compression, elevation -she will call if it worsens -may also be 2/2 amlodipine, discussed  Tachycardia, paroxysmal with palpitations: stable.If they increase, order monitor  Plan for follow up: 3 mos or sooner PRN  Medication Adjustments/Labs and Tests Ordered: Current medicines are reviewed at length with the patient today.  Concerns regarding medicines are outlined above.  No orders of the defined types were placed in this encounter.  No orders of the defined types were placed in this encounter.   Patient Instructions  Medication Instructions:  Your Physician recommend you continue on your current medication as directed.    If you need a refill on your cardiac medications before  your next appointment, please call your pharmacy.   Lab work: None  Testing/Procedures: None   Follow-Up: At [facility], you and your health needs are our priority.  As part of our continuing mission to provide you with exceptional heart care, we have created designated Provider Care Teams.  These Care Teams include your primary Cardiologist (physician) and Advanced Practice Providers (APPs -  Physician Assistants and Nurse Practitioners) who all work together to provide you with the care you need, when you need it. You will need a follow up appointment in 3 months.  Please call our office 2 months in advance to schedule this appointment.  You may see Buford Dresser, MD or one of the following Advanced Practice Providers on your designated Care Team:   Rosaria Ferries, PA-C . Jory Sims, DNP, ANP        Signed, Buford Dresser, MD PhD 04/18/2019 [place]

## 2019-04-24 DIAGNOSIS — M47816 Spondylosis without myelopathy or radiculopathy, lumbar region: Secondary | ICD-10-CM | POA: Diagnosis not present

## 2019-04-27 DIAGNOSIS — M47816 Spondylosis without myelopathy or radiculopathy, lumbar region: Secondary | ICD-10-CM | POA: Diagnosis not present

## 2019-05-01 DIAGNOSIS — M47816 Spondylosis without myelopathy or radiculopathy, lumbar region: Secondary | ICD-10-CM | POA: Diagnosis not present

## 2019-05-04 DIAGNOSIS — M47816 Spondylosis without myelopathy or radiculopathy, lumbar region: Secondary | ICD-10-CM | POA: Diagnosis not present

## 2019-05-08 DIAGNOSIS — M47816 Spondylosis without myelopathy or radiculopathy, lumbar region: Secondary | ICD-10-CM | POA: Diagnosis not present

## 2019-05-11 ENCOUNTER — Encounter: Payer: Self-pay | Admitting: Cardiology

## 2019-05-11 DIAGNOSIS — M47816 Spondylosis without myelopathy or radiculopathy, lumbar region: Secondary | ICD-10-CM | POA: Diagnosis not present

## 2019-05-15 DIAGNOSIS — M47816 Spondylosis without myelopathy or radiculopathy, lumbar region: Secondary | ICD-10-CM | POA: Diagnosis not present

## 2019-05-22 DIAGNOSIS — M810 Age-related osteoporosis without current pathological fracture: Secondary | ICD-10-CM | POA: Diagnosis not present

## 2019-05-22 DIAGNOSIS — F322 Major depressive disorder, single episode, severe without psychotic features: Secondary | ICD-10-CM | POA: Diagnosis not present

## 2019-05-22 DIAGNOSIS — E039 Hypothyroidism, unspecified: Secondary | ICD-10-CM | POA: Diagnosis not present

## 2019-05-22 DIAGNOSIS — G479 Sleep disorder, unspecified: Secondary | ICD-10-CM | POA: Diagnosis not present

## 2019-05-22 DIAGNOSIS — R55 Syncope and collapse: Secondary | ICD-10-CM | POA: Diagnosis not present

## 2019-05-22 DIAGNOSIS — Z0000 Encounter for general adult medical examination without abnormal findings: Secondary | ICD-10-CM | POA: Diagnosis not present

## 2019-05-23 DIAGNOSIS — M47816 Spondylosis without myelopathy or radiculopathy, lumbar region: Secondary | ICD-10-CM | POA: Diagnosis not present

## 2019-06-06 DIAGNOSIS — F332 Major depressive disorder, recurrent severe without psychotic features: Secondary | ICD-10-CM | POA: Diagnosis not present

## 2019-06-15 DIAGNOSIS — K580 Irritable bowel syndrome with diarrhea: Secondary | ICD-10-CM | POA: Diagnosis not present

## 2019-06-15 DIAGNOSIS — R768 Other specified abnormal immunological findings in serum: Secondary | ICD-10-CM | POA: Diagnosis not present

## 2019-06-20 DIAGNOSIS — Z1231 Encounter for screening mammogram for malignant neoplasm of breast: Secondary | ICD-10-CM | POA: Diagnosis not present

## 2019-06-20 DIAGNOSIS — M810 Age-related osteoporosis without current pathological fracture: Secondary | ICD-10-CM | POA: Diagnosis not present

## 2019-06-20 DIAGNOSIS — Z8781 Personal history of (healed) traumatic fracture: Secondary | ICD-10-CM | POA: Diagnosis not present

## 2019-06-20 DIAGNOSIS — Z96651 Presence of right artificial knee joint: Secondary | ICD-10-CM | POA: Diagnosis not present

## 2019-06-20 DIAGNOSIS — K589 Irritable bowel syndrome without diarrhea: Secondary | ICD-10-CM | POA: Diagnosis not present

## 2019-06-20 DIAGNOSIS — Z8262 Family history of osteoporosis: Secondary | ICD-10-CM | POA: Diagnosis not present

## 2019-06-22 DIAGNOSIS — D72819 Decreased white blood cell count, unspecified: Secondary | ICD-10-CM | POA: Diagnosis not present

## 2019-07-03 DIAGNOSIS — F332 Major depressive disorder, recurrent severe without psychotic features: Secondary | ICD-10-CM | POA: Diagnosis not present

## 2019-07-10 ENCOUNTER — Other Ambulatory Visit: Payer: Self-pay | Admitting: Cardiology

## 2019-07-10 DIAGNOSIS — I10 Essential (primary) hypertension: Secondary | ICD-10-CM

## 2019-07-24 ENCOUNTER — Encounter: Payer: Self-pay | Admitting: Cardiology

## 2019-07-24 ENCOUNTER — Ambulatory Visit (INDEPENDENT_AMBULATORY_CARE_PROVIDER_SITE_OTHER): Payer: Medicare Other | Admitting: Cardiology

## 2019-07-24 ENCOUNTER — Other Ambulatory Visit: Payer: Self-pay

## 2019-07-24 VITALS — BP 142/74 | HR 79 | Temp 97.2°F | Ht 63.0 in | Wt 126.0 lb

## 2019-07-24 DIAGNOSIS — R600 Localized edema: Secondary | ICD-10-CM | POA: Diagnosis not present

## 2019-07-24 DIAGNOSIS — Z7189 Other specified counseling: Secondary | ICD-10-CM

## 2019-07-24 DIAGNOSIS — I10 Essential (primary) hypertension: Secondary | ICD-10-CM

## 2019-07-24 DIAGNOSIS — Z87898 Personal history of other specified conditions: Secondary | ICD-10-CM

## 2019-07-24 MED ORDER — AMLODIPINE BESYLATE 10 MG PO TABS: 10.0000 mg | ORAL_TABLET | Freq: Every day | ORAL | 3 refills | Status: DC

## 2019-07-24 NOTE — Progress Notes (Signed)
Cardiology Office Note:    Date:  07/24/2019   ID:  Tiffany Fox, DOB [birth year], MRN [id number]  PCP:  Aretta Nip, MD  Cardiologist:  Buford Dresser, MD PhD  Referring MD: Aretta Nip, MD   CC: follow up  History of Present Illness:    Tiffany Fox is a 85 y.o. female with a hx of insomnia, arthritis s/p knee replacement, hypertension.  Initial consult 11/30/18 for tachycardia and orthostatic hypotension after knee surgery. We also discussed hypertension. Plan at that time was to stop fish oil (had chronic diarrhea), if diarrhea improved trial stopping cholestyramine. Also discussed her medication interaction risk, referred her back to PCP to review these. Discussed gradual increase in activity, monitoring heart rate. Also discussed LDL, but she is not interested in statins, and given her age this is reasonable based on her preference.  Today: Feels that things are calming down, much less anxiety, thinks this is a big part of her symptoms. No more syncope. Feels those events were related to not having eaten prior to going shopping at the grocery store.  Blood pressures have been in the 130s/70s at home.   Denies chest pain, shortness of breath at rest or with normal exertion. No PND, orthopnea, or unexpected weight gain. No syncope or palpitations. Intermittent mild LE edema, improves with elevation.   Taking cholestipol for chronic diarrhea, but hasn't improved her diarrhea much. Discussed omega 3 FA again, no indication for this from a CV perspective. Pending attempt at gluten free diet to see if this helps with diarrhea.  Had abnormal bone density test, recommended to start calcium + vitamin D plus fosamax. She would prefer not to do this given what she has read about risk. Discussed pros/cons from a heart perspective, to me would be indicated given her fracture risk. Also discussed flu shot, how it is made. I recommend this as well.  Past Medical  History:  Diagnosis Date  . Anxiety   . Arthritis   . Cervical spondylosis   . Depression   . Hypertension   . Hypothyroidism   . Leukocytopenia   . Osteoporosis     Past Surgical History:  Procedure Laterality Date  . APPENDECTOMY  2012  . EYE SURGERY Bilateral 2012  . FRACTURE SURGERY    . HIP FRACTURE SURGERY Left 05.2011  . JOINT REPLACEMENT    . TOTAL KNEE ARTHROPLASTY Right 10/18/2018  . TOTAL KNEE ARTHROPLASTY Right 10/18/2018   Procedure: TOTAL KNEE ARTHROPLASTY;  Surgeon: Melrose Nakayama, MD;  Location: [place];  Service: Orthopedics;  Laterality: Right;    Current Medications: Current Outpatient Medications on File Prior to Visit  Medication Sig  . Cholecalciferol (VITAMIN D-1000 MAX ST PO) Take 1 tablet by mouth daily.  . colestipol (COLESTID) 1 g tablet Take 4 tablets by mouth as directed.  . escitalopram (LEXAPRO) 10 MG tablet daily. Pt takes 1 tablet and a half tablet daily  . eszopiclone (LUNESTA) 2 MG TABS tablet Take 2 mg by mouth at bedtime. Take immediately before bedtime  . Multiple Vitamins-Minerals (HAIR/SKIN/NAILS PO) Take 1 tablet by mouth daily.  Marland Kitchen thyroid (ARMOUR) 60 MG tablet Take 60 mg by mouth daily before breakfast.    No current facility-administered medications on file prior to visit.      Allergies:   Lisinopril   Social History   Tobacco Use  . Smoking status: Never Smoker  . Smokeless tobacco: Never Used  Substance Use Topics  . Alcohol use: Yes  Alcohol/week: 2.0 standard drinks    Types: 2 Glasses of wine per week    Comment: wine   . Drug use: No    Family History: The patient's family history includes Heart disease in her brother, brother, and maternal grandfather; Stroke in her father.  ROS:   Please see the history of present illness.  Additional pertinent ROS: Constitutional: Negative for chills, fever, night sweats, unintentional weight loss  HENT: Negative for ear pain and hearing loss.   Eyes: Negative for loss of  vision and eye pain.  Respiratory: Negative for cough, sputum, wheezing.   Cardiovascular: See HPI. Gastrointestinal: Negative for abdominal pain, melena, and hematochezia.  Genitourinary: Negative for dysuria and hematuria.  Musculoskeletal: Negative for falls and myalgias.  Skin: Negative for itching and rash.  Neurological: Negative for focal weakness, focal sensory changes and loss of consciousness.  Endo/Heme/Allergies: Does not bruise/bleed easily.   EKGs/Labs/Other Studies Reviewed:    The following studies were reviewed today: No CV studies  EKG:  EKG is personally reviewed.  The ekg ordered today demonstrates normal sinus rhythm at 79 bpm  Recent Labs: 11/07/2018: BUN 18; Creatinine, Ser 0.99; Hemoglobin 12.3; Platelets 321; Potassium 3.4; Sodium 135  Recent Lipid Panel No results found for: CHOL, TRIG, HDL, CHOLHDL, VLDL, LDLCALC, LDLDIRECT  Physical Exam:    VS:  BP (!) 142/74   Pulse 79   Temp (!) 97.2 F (36.2 C)   Ht 5\' 3"  (1.6 m)   Wt 126 lb (57.2 kg)   SpO2 99%   BMI 22.32 kg/m     Wt Readings from Last 3 Encounters:  07/24/19 126 lb (57.2 kg)  04/18/19 127 lb 3.2 oz (57.7 kg)  11/30/18 125 lb (56.7 kg)    GEN: Well nourished, well developed in no acute distress HEENT: Normal, moist mucous membranes NECK: No JVD CARDIAC: regular rhythm, normal S1 and S2, no rubs or gallops. No murmurs. VASCULAR: Radial and DP pulses 2+ bilaterally. No carotid bruits RESPIRATORY:  Clear to auscultation without rales, wheezing or rhonchi  ABDOMEN: Soft, non-tender, non-distended MUSCULOSKELETAL:  Ambulates independently SKIN: Warm and dry, trace bilateral LE edema NEUROLOGIC:  Alert and oriented x 3. No focal neuro deficits noted. PSYCHIATRIC:  Normal affect   ASSESSMENT:    1. Essential hypertension   2. Cardiac risk counseling   3. Encounter for medication review and counseling   4. History of syncope    PLAN:    Syncope: two episodes, both outside in the  parking lot in warm weather. She had a prodrome of lightheadedness/dizzyness. Similar to prior distant events. Likely reflex syncope. -not related to sensation of palpitations -no further episodes  Hypertension:  -BP log generally well controlled at home, slightly elevated in office today -goal 130/80 -continue amlodipine 10 mg  LE edema: now intermittent, improving -have discussed potential role of amlodipine -continue elevation  Medication counseling in the setting of cardiac risk counseling -I would recommend calcium/vitamin D and bisphosphonates at her PCP's discretion given reported low bone density and risk of fracture. We discussed the available evidence for/against this from CV standpoint, and I think her fracture risk takes priority -also discussed flu shot, I recommend this  Plan for follow up: 6 mos or sooner PRN  Medication Adjustments/Labs and Tests Ordered: Current medicines are reviewed at length with the patient today.  Concerns regarding medicines are outlined above.  Orders Placed This Encounter  Procedures  . EKG 12-Lead   Meds ordered this encounter  Medications  .  amLODipine (NORVASC) 10 MG tablet    Sig: Take 1 tablet (10 mg total) by mouth daily.    Dispense:  90 tablet    Refill:  3    Patient Instructions  Medication Instructions:  Your Physician recommend you continue on your current medication as directed.    *If you need a refill on your cardiac medications before your next appointment, please call your pharmacy*  Lab Work: None  Testing/Procedures: None  Follow-Up: At [facility], you and your health needs are our priority.  As part of our continuing mission to provide you with exceptional heart care, we have created designated Provider Care Teams.  These Care Teams include your primary Cardiologist (physician) and Advanced Practice Providers (APPs -  Physician Assistants and Nurse Practitioners) who all work together to provide you with  the care you need, when you need it.  Your next appointment:   6 months  The format for your next appointment:   In Person  Provider:   Buford Dresser, MD        Signed, Buford Dresser, MD PhD 07/24/2019 [place]

## 2019-07-24 NOTE — Patient Instructions (Signed)

## 2019-07-31 DIAGNOSIS — D2371 Other benign neoplasm of skin of right lower limb, including hip: Secondary | ICD-10-CM | POA: Diagnosis not present

## 2019-07-31 DIAGNOSIS — M21622 Bunionette of left foot: Secondary | ICD-10-CM | POA: Diagnosis not present

## 2019-07-31 DIAGNOSIS — M21621 Bunionette of right foot: Secondary | ICD-10-CM | POA: Diagnosis not present

## 2019-08-17 DIAGNOSIS — D044 Carcinoma in situ of skin of scalp and neck: Secondary | ICD-10-CM | POA: Diagnosis not present

## 2019-08-17 DIAGNOSIS — L821 Other seborrheic keratosis: Secondary | ICD-10-CM | POA: Diagnosis not present

## 2019-08-17 DIAGNOSIS — D1801 Hemangioma of skin and subcutaneous tissue: Secondary | ICD-10-CM | POA: Diagnosis not present

## 2019-08-17 DIAGNOSIS — Z85828 Personal history of other malignant neoplasm of skin: Secondary | ICD-10-CM | POA: Diagnosis not present

## 2019-08-17 DIAGNOSIS — L570 Actinic keratosis: Secondary | ICD-10-CM | POA: Diagnosis not present

## 2019-08-17 DIAGNOSIS — L820 Inflamed seborrheic keratosis: Secondary | ICD-10-CM | POA: Diagnosis not present

## 2019-08-17 DIAGNOSIS — L814 Other melanin hyperpigmentation: Secondary | ICD-10-CM | POA: Diagnosis not present

## 2019-08-17 DIAGNOSIS — D485 Neoplasm of uncertain behavior of skin: Secondary | ICD-10-CM | POA: Diagnosis not present

## 2019-08-17 DIAGNOSIS — D692 Other nonthrombocytopenic purpura: Secondary | ICD-10-CM | POA: Diagnosis not present

## 2019-08-28 DIAGNOSIS — F332 Major depressive disorder, recurrent severe without psychotic features: Secondary | ICD-10-CM | POA: Diagnosis not present

## 2019-09-05 DIAGNOSIS — D044 Carcinoma in situ of skin of scalp and neck: Secondary | ICD-10-CM | POA: Diagnosis not present

## 2019-10-23 DIAGNOSIS — F332 Major depressive disorder, recurrent severe without psychotic features: Secondary | ICD-10-CM | POA: Diagnosis not present

## 2019-10-31 DIAGNOSIS — M21961 Unspecified acquired deformity of right lower leg: Secondary | ICD-10-CM | POA: Diagnosis not present

## 2019-10-31 DIAGNOSIS — M21622 Bunionette of left foot: Secondary | ICD-10-CM | POA: Diagnosis not present

## 2019-10-31 DIAGNOSIS — M21621 Bunionette of right foot: Secondary | ICD-10-CM | POA: Diagnosis not present

## 2019-10-31 DIAGNOSIS — D2371 Other benign neoplasm of skin of right lower limb, including hip: Secondary | ICD-10-CM | POA: Diagnosis not present

## 2019-11-02 ENCOUNTER — Ambulatory Visit: Payer: Medicare Other

## 2019-11-10 ENCOUNTER — Ambulatory Visit: Payer: Medicare Other | Attending: Internal Medicine

## 2019-11-10 DIAGNOSIS — Z23 Encounter for immunization: Secondary | ICD-10-CM | POA: Insufficient documentation

## 2019-11-10 NOTE — Progress Notes (Signed)
   Covid-19 Vaccination Clinic  Name:  Tiffany Fox    MRN: [id number] DOB: [birth year]  11/10/2019  Tiffany Fox was observed post Covid-19 immunization for 15 minutes without incidence. She was provided with Vaccine Information Sheet and instruction to access the V-Safe system.   Tiffany Fox was instructed to call 911 with any severe reactions post vaccine: Marland Kitchen Difficulty breathing  . Swelling of your face and throat  . A fast heartbeat  . A bad rash all over your body  . Dizziness and weakness    Immunizations Administered    Name Date Dose VIS Date Route   Pfizer COVID-19 Vaccine 11/10/2019  5:38 PM 0.3 mL 09/15/2019 Intramuscular   Manufacturer: [place]   Lot: [id number]   [place]: [id number]

## 2019-11-29 DIAGNOSIS — E785 Hyperlipidemia, unspecified: Secondary | ICD-10-CM | POA: Diagnosis not present

## 2019-11-29 DIAGNOSIS — M810 Age-related osteoporosis without current pathological fracture: Secondary | ICD-10-CM | POA: Diagnosis not present

## 2019-11-29 DIAGNOSIS — E039 Hypothyroidism, unspecified: Secondary | ICD-10-CM | POA: Diagnosis not present

## 2019-11-29 DIAGNOSIS — F322 Major depressive disorder, single episode, severe without psychotic features: Secondary | ICD-10-CM | POA: Diagnosis not present

## 2019-11-29 DIAGNOSIS — E7800 Pure hypercholesterolemia, unspecified: Secondary | ICD-10-CM | POA: Diagnosis not present

## 2019-12-05 ENCOUNTER — Ambulatory Visit: Payer: Medicare Other | Attending: Internal Medicine

## 2019-12-05 ENCOUNTER — Ambulatory Visit: Payer: Medicare Other

## 2019-12-05 DIAGNOSIS — Z23 Encounter for immunization: Secondary | ICD-10-CM

## 2019-12-05 NOTE — Progress Notes (Signed)
   Covid-19 Vaccination Clinic  Name:  Tiffany Fox    MRN: [id number] DOB: 03/10/1934  12/05/2019  [name] was observed post Covid-19 immunization for 15 minutes without incident. She was provided with Vaccine Information Sheet and instruction to access the V-Safe system.   [name] was instructed to call 911 with any severe reactions post vaccine: Marland Kitchen Difficulty breathing  . Swelling of face and throat  . A fast heartbeat  . A bad rash all over body  . Dizziness and weakness   Immunizations Administered    Name Date Dose VIS Date Route   Pfizer COVID-19 Vaccine 12/05/2019  2:21 PM 0.3 mL 09/15/2019 Intramuscular   Manufacturer: [place]   Lot: [id number]   [name]: [id number]

## 2019-12-18 DIAGNOSIS — F332 Major depressive disorder, recurrent severe without psychotic features: Secondary | ICD-10-CM | POA: Diagnosis not present

## 2019-12-21 DIAGNOSIS — H40003 Preglaucoma, unspecified, bilateral: Secondary | ICD-10-CM | POA: Diagnosis not present

## 2019-12-26 DIAGNOSIS — F322 Major depressive disorder, single episode, severe without psychotic features: Secondary | ICD-10-CM | POA: Diagnosis not present

## 2019-12-26 DIAGNOSIS — E7800 Pure hypercholesterolemia, unspecified: Secondary | ICD-10-CM | POA: Diagnosis not present

## 2019-12-26 DIAGNOSIS — M810 Age-related osteoporosis without current pathological fracture: Secondary | ICD-10-CM | POA: Diagnosis not present

## 2019-12-26 DIAGNOSIS — E039 Hypothyroidism, unspecified: Secondary | ICD-10-CM | POA: Diagnosis not present

## 2019-12-26 DIAGNOSIS — E785 Hyperlipidemia, unspecified: Secondary | ICD-10-CM | POA: Diagnosis not present

## 2020-01-02 ENCOUNTER — Telehealth: Payer: Self-pay | Admitting: Cardiology

## 2020-01-02 NOTE — Telephone Encounter (Signed)
LMOM RE: F/U Visit--- AF 

## 2020-02-02 ENCOUNTER — Ambulatory Visit: Payer: Medicare Other | Admitting: Cardiology

## 2020-02-02 DIAGNOSIS — F322 Major depressive disorder, single episode, severe without psychotic features: Secondary | ICD-10-CM | POA: Diagnosis not present

## 2020-02-02 DIAGNOSIS — E785 Hyperlipidemia, unspecified: Secondary | ICD-10-CM | POA: Diagnosis not present

## 2020-02-02 DIAGNOSIS — E039 Hypothyroidism, unspecified: Secondary | ICD-10-CM | POA: Diagnosis not present

## 2020-02-02 DIAGNOSIS — M810 Age-related osteoporosis without current pathological fracture: Secondary | ICD-10-CM | POA: Diagnosis not present

## 2020-02-02 DIAGNOSIS — E7800 Pure hypercholesterolemia, unspecified: Secondary | ICD-10-CM | POA: Diagnosis not present

## 2020-02-05 DIAGNOSIS — M21622 Bunionette of left foot: Secondary | ICD-10-CM | POA: Diagnosis not present

## 2020-02-05 DIAGNOSIS — D2371 Other benign neoplasm of skin of right lower limb, including hip: Secondary | ICD-10-CM | POA: Diagnosis not present

## 2020-02-05 DIAGNOSIS — M21621 Bunionette of right foot: Secondary | ICD-10-CM | POA: Diagnosis not present

## 2020-02-05 DIAGNOSIS — M792 Neuralgia and neuritis, unspecified: Secondary | ICD-10-CM | POA: Diagnosis not present

## 2020-02-12 DIAGNOSIS — F332 Major depressive disorder, recurrent severe without psychotic features: Secondary | ICD-10-CM | POA: Diagnosis not present

## 2020-02-13 ENCOUNTER — Ambulatory Visit (INDEPENDENT_AMBULATORY_CARE_PROVIDER_SITE_OTHER): Payer: Medicare Other | Admitting: Cardiology

## 2020-02-13 ENCOUNTER — Other Ambulatory Visit: Payer: Self-pay

## 2020-02-13 ENCOUNTER — Encounter: Payer: Self-pay | Admitting: Cardiology

## 2020-02-13 VITALS — BP 111/65 | HR 99 | Ht 63.0 in | Wt 123.6 lb

## 2020-02-13 DIAGNOSIS — R600 Localized edema: Secondary | ICD-10-CM

## 2020-02-13 DIAGNOSIS — Z7189 Other specified counseling: Secondary | ICD-10-CM

## 2020-02-13 DIAGNOSIS — Z87898 Personal history of other specified conditions: Secondary | ICD-10-CM

## 2020-02-13 DIAGNOSIS — I10 Essential (primary) hypertension: Secondary | ICD-10-CM | POA: Diagnosis not present

## 2020-02-13 NOTE — Patient Instructions (Signed)

## 2020-02-13 NOTE — Progress Notes (Signed)
Cardiology Office Note:    Date:  02/13/2020   ID:  Tiffany Fox, DOB 1934/09/25, MRN [id number]  PCP:  Aretta Nip, MD  Cardiologist:  Buford Dresser, MD PhD  Referring MD: Aretta Nip, MD   CC: follow up  History of Present Illness:    Tiffany Fox is a 86 y.o. female with a hx of insomnia, arthritis s/p knee replacement, hypertension.  Initial consult 11/30/18 for tachycardia and orthostatic hypotension after knee surgery. We also discussed hypertension. Plan at that time was to stop fish oil (had chronic diarrhea), if diarrhea improved trial stopping cholestyramine. Also discussed her medication interaction risk, referred her back to PCP to review these. Discussed gradual increase in activity, monitoring heart rate. Also discussed LDL, but she is not interested in statins, and given her age this is reasonable based on her preference.  Today: Had to walk several flights of stairs to get to the office today; despite this, blood pressure is excellent today. Started taking BP intermittently at home. Range 127/68, 126/70 to 151/80. Most [id number] systolic. Tolerating amlodipine. No significant low BP numbers.   Has struggled with anxiety since the death of her son several years ago. Following closely with psychiatrist. Likely changing lexapro, doesn't have new dose with her today. Also dealing with arthritis, occasional LE swelling. Mild in the evenings, better the following AM.   Has had both Covid vaccines.  Denies chest pain, shortness of breath at rest or with normal exertion. No PND, orthopnea, or unexpected weight gain. No syncope recently, no palpitations but can feel her heart beat at night.  Past Medical History:  Diagnosis Date  . Anxiety   . Arthritis   . Cervical spondylosis   . Depression   . Hypertension   . Hypothyroidism   . Leukocytopenia   . Osteoporosis     Past Surgical History:  Procedure Laterality Date  . APPENDECTOMY   2012  . EYE SURGERY Bilateral 2012  . FRACTURE SURGERY    . HIP FRACTURE SURGERY Left 05.2011  . JOINT REPLACEMENT    . TOTAL KNEE ARTHROPLASTY Right 10/18/2018  . TOTAL KNEE ARTHROPLASTY Right 10/18/2018   Procedure: TOTAL KNEE ARTHROPLASTY;  Surgeon: Melrose Nakayama, MD;  Location: [facility];  Service: Orthopedics;  Laterality: Right;    Current Medications: Current Outpatient Medications on File Prior to Visit  Medication Sig  . amLODipine (NORVASC) 10 MG tablet Take 1 tablet (10 mg total) by mouth daily.  . Cholecalciferol (VITAMIN D-1000 MAX ST PO) Take 1 tablet by mouth daily.  . colestipol (COLESTID) 1 g tablet Take 4 tablets by mouth as directed.  . escitalopram (LEXAPRO) 10 MG tablet daily. Pt takes 1 tablet and a half tablet daily  . eszopiclone (LUNESTA) 2 MG TABS tablet Take 2 mg by mouth at bedtime. Take immediately before bedtime  . Multiple Vitamins-Minerals (HAIR/SKIN/NAILS PO) Take 1 tablet by mouth daily.  Marland Kitchen thyroid (ARMOUR) 60 MG tablet Take 60 mg by mouth daily before breakfast.    No current facility-administered medications on file prior to visit.     Allergies:   Lisinopril   Social History   Tobacco Use  . Smoking status: Never Smoker  . Smokeless tobacco: Never Used  Substance Use Topics  . Alcohol use: Yes    Alcohol/week: 2.0 standard drinks    Types: 2 Glasses of wine per week    Comment: wine   . Drug use: No    Family History: The patient's family  history includes Heart disease in her brother, brother, and maternal grandfather; Stroke in her father.  ROS:   Please see the history of present illness.  Additional pertinent ROS otherwise unremarkable.  EKGs/Labs/Other Studies Reviewed:    The following studies were reviewed today: No cardiac studies (vascular u/s from 2020 reviewed)  EKG:  EKG is personally reviewed.  The ekg ordered 07/25/19 demonstrates normal sinus rhythm at 79 bpm  Recent Labs: No results found for requested labs within  last 8760 hours.  Recent Lipid Panel No results found for: CHOL, TRIG, HDL, CHOLHDL, VLDL, LDLCALC, LDLDIRECT  Physical Exam:    VS:  BP 111/65   Pulse 99   Ht 5\' 3"  (1.6 m)   Wt 123 lb 9.6 oz (56.1 kg)   SpO2 98%   BMI 21.89 kg/m     Wt Readings from Last 3 Encounters:  02/13/20 123 lb 9.6 oz (56.1 kg)  07/24/19 126 lb (57.2 kg)  04/18/19 127 lb 3.2 oz (57.7 kg)    GEN: Well nourished, well developed in no acute distress HEENT: Normal, moist mucous membranes NECK: No JVD CARDIAC: regular rhythm, normal S1 and S2, no rubs or gallops. No murmur. VASCULAR: Radial and DP pulses 2+ bilaterally. No carotid bruits RESPIRATORY:  Clear to auscultation without rales, wheezing or rhonchi  ABDOMEN: Soft, non-tender, non-distended MUSCULOSKELETAL:  Ambulates independently SKIN: Warm and dry, trivial bilateral LE edema NEUROLOGIC:  Alert and oriented x 3. No focal neuro deficits noted. PSYCHIATRIC:  Normal affect   ASSESSMENT:    1. Essential hypertension   2. History of syncope   3. Bilateral leg edema   4. Cardiac risk counseling   5. Counseling on health promotion and disease prevention    PLAN:    Syncope: no recurrence -two prior episodes, both outside in the parking lot in warm weather. She had a prodrome of lightheadedness/dizzyness. Similar to prior distant events. Likely reflex syncope. -not related to sensation of palpitations -no further episodes  Hypertension:  -BP much improved -goal <130/80 -continue amlodipine 10 mg -checking home BP numbers, has log  LE edema: trivial today -have discussed potential role of amlodipine -continue elevation  CV risk counseling and prevention: -recommend heart healthy/Mediterranean diet, with whole grains, fruits, vegetable, fish, lean meats, nuts, and olive oil. Limit salt. -recommend moderate walking, 3-5 times/week for 30-50 minutes each session. Aim for at least 150 minutes.week. Goal should be pace of 3 miles/hours, or  walking 1.5 miles in 30 minutes -recommend avoidance of tobacco products. Avoid excess alcohol. -ASCVD risk score: The ASCVD Risk score Mikey Bussing DC Jr., et al., 2013) failed to calculate for the following reasons:   The 2013 ASCVD risk score is only valid for ages 41 to 65   Plan for follow up: 1 year or sooner as needed  Medication Adjustments/Labs and Tests Ordered: Current medicines are reviewed at length with the patient today.  Concerns regarding medicines are outlined above.  No orders of the defined types were placed in this encounter.  No orders of the defined types were placed in this encounter.   Patient Instructions  Medication Instructions:  Your Physician recommend you continue on your current medication as directed.    *If you need a refill on your cardiac medications before your next appointment, please call your pharmacy*   Lab Work: None   Testing/Procedures: None   Follow-Up: At [facility], you and your health needs are our priority.  As part of our continuing mission to provide you  with exceptional heart care, we have created designated Provider Care Teams.  These Care Teams include your primary Cardiologist (physician) and Advanced Practice Providers (APPs -  Physician Assistants and Nurse Practitioners) who all work together to provide you with the care you need, when you need it.  We recommend signing up for the patient portal called "MyChart".  Sign up information is provided on this After Visit Summary.  MyChart is used to connect with patients for Virtual Visits (Telemedicine).  Patients are able to view lab/test results, encounter notes, upcoming appointments, etc.  Non-urgent messages can be sent to your provider as well.   To learn more about what you can do with MyChart, go to [email or web address].    Your next appointment:   1 year(s)  The format for your next appointment:   In Person  Provider:   Buford Dresser, MD      Signed, Buford Dresser, MD PhD 02/13/2020 [place]

## 2020-02-21 DIAGNOSIS — M9902 Segmental and somatic dysfunction of thoracic region: Secondary | ICD-10-CM | POA: Diagnosis not present

## 2020-02-21 DIAGNOSIS — M5415 Radiculopathy, thoracolumbar region: Secondary | ICD-10-CM | POA: Diagnosis not present

## 2020-02-21 DIAGNOSIS — M5416 Radiculopathy, lumbar region: Secondary | ICD-10-CM | POA: Diagnosis not present

## 2020-02-21 DIAGNOSIS — M5137 Other intervertebral disc degeneration, lumbosacral region: Secondary | ICD-10-CM | POA: Diagnosis not present

## 2020-02-21 DIAGNOSIS — M9903 Segmental and somatic dysfunction of lumbar region: Secondary | ICD-10-CM | POA: Diagnosis not present

## 2020-02-21 DIAGNOSIS — M9904 Segmental and somatic dysfunction of sacral region: Secondary | ICD-10-CM | POA: Diagnosis not present

## 2020-02-26 DIAGNOSIS — M5137 Other intervertebral disc degeneration, lumbosacral region: Secondary | ICD-10-CM | POA: Diagnosis not present

## 2020-02-26 DIAGNOSIS — M9904 Segmental and somatic dysfunction of sacral region: Secondary | ICD-10-CM | POA: Diagnosis not present

## 2020-02-26 DIAGNOSIS — M5416 Radiculopathy, lumbar region: Secondary | ICD-10-CM | POA: Diagnosis not present

## 2020-02-26 DIAGNOSIS — M9903 Segmental and somatic dysfunction of lumbar region: Secondary | ICD-10-CM | POA: Diagnosis not present

## 2020-02-26 DIAGNOSIS — M9902 Segmental and somatic dysfunction of thoracic region: Secondary | ICD-10-CM | POA: Diagnosis not present

## 2020-02-26 DIAGNOSIS — M5415 Radiculopathy, thoracolumbar region: Secondary | ICD-10-CM | POA: Diagnosis not present

## 2020-02-29 DIAGNOSIS — M5416 Radiculopathy, lumbar region: Secondary | ICD-10-CM | POA: Diagnosis not present

## 2020-02-29 DIAGNOSIS — M9903 Segmental and somatic dysfunction of lumbar region: Secondary | ICD-10-CM | POA: Diagnosis not present

## 2020-02-29 DIAGNOSIS — M5415 Radiculopathy, thoracolumbar region: Secondary | ICD-10-CM | POA: Diagnosis not present

## 2020-02-29 DIAGNOSIS — M9904 Segmental and somatic dysfunction of sacral region: Secondary | ICD-10-CM | POA: Diagnosis not present

## 2020-02-29 DIAGNOSIS — M9902 Segmental and somatic dysfunction of thoracic region: Secondary | ICD-10-CM | POA: Diagnosis not present

## 2020-02-29 DIAGNOSIS — M5137 Other intervertebral disc degeneration, lumbosacral region: Secondary | ICD-10-CM | POA: Diagnosis not present

## 2020-03-05 DIAGNOSIS — M5137 Other intervertebral disc degeneration, lumbosacral region: Secondary | ICD-10-CM | POA: Diagnosis not present

## 2020-03-05 DIAGNOSIS — M5416 Radiculopathy, lumbar region: Secondary | ICD-10-CM | POA: Diagnosis not present

## 2020-03-05 DIAGNOSIS — M9902 Segmental and somatic dysfunction of thoracic region: Secondary | ICD-10-CM | POA: Diagnosis not present

## 2020-03-05 DIAGNOSIS — M5415 Radiculopathy, thoracolumbar region: Secondary | ICD-10-CM | POA: Diagnosis not present

## 2020-03-05 DIAGNOSIS — M9903 Segmental and somatic dysfunction of lumbar region: Secondary | ICD-10-CM | POA: Diagnosis not present

## 2020-03-05 DIAGNOSIS — M9904 Segmental and somatic dysfunction of sacral region: Secondary | ICD-10-CM | POA: Diagnosis not present

## 2020-03-07 DIAGNOSIS — M5415 Radiculopathy, thoracolumbar region: Secondary | ICD-10-CM | POA: Diagnosis not present

## 2020-03-07 DIAGNOSIS — M9904 Segmental and somatic dysfunction of sacral region: Secondary | ICD-10-CM | POA: Diagnosis not present

## 2020-03-07 DIAGNOSIS — M9903 Segmental and somatic dysfunction of lumbar region: Secondary | ICD-10-CM | POA: Diagnosis not present

## 2020-03-07 DIAGNOSIS — M5416 Radiculopathy, lumbar region: Secondary | ICD-10-CM | POA: Diagnosis not present

## 2020-03-07 DIAGNOSIS — M5137 Other intervertebral disc degeneration, lumbosacral region: Secondary | ICD-10-CM | POA: Diagnosis not present

## 2020-03-07 DIAGNOSIS — M9902 Segmental and somatic dysfunction of thoracic region: Secondary | ICD-10-CM | POA: Diagnosis not present

## 2020-04-04 ENCOUNTER — Encounter: Payer: Self-pay | Admitting: Cardiology

## 2020-04-15 DIAGNOSIS — F322 Major depressive disorder, single episode, severe without psychotic features: Secondary | ICD-10-CM | POA: Diagnosis not present

## 2020-04-15 DIAGNOSIS — E785 Hyperlipidemia, unspecified: Secondary | ICD-10-CM | POA: Diagnosis not present

## 2020-04-15 DIAGNOSIS — M810 Age-related osteoporosis without current pathological fracture: Secondary | ICD-10-CM | POA: Diagnosis not present

## 2020-04-15 DIAGNOSIS — E7800 Pure hypercholesterolemia, unspecified: Secondary | ICD-10-CM | POA: Diagnosis not present

## 2020-04-15 DIAGNOSIS — E039 Hypothyroidism, unspecified: Secondary | ICD-10-CM | POA: Diagnosis not present

## 2020-04-18 DIAGNOSIS — M9902 Segmental and somatic dysfunction of thoracic region: Secondary | ICD-10-CM | POA: Diagnosis not present

## 2020-04-18 DIAGNOSIS — M5415 Radiculopathy, thoracolumbar region: Secondary | ICD-10-CM | POA: Diagnosis not present

## 2020-04-18 DIAGNOSIS — M5137 Other intervertebral disc degeneration, lumbosacral region: Secondary | ICD-10-CM | POA: Diagnosis not present

## 2020-04-18 DIAGNOSIS — M5416 Radiculopathy, lumbar region: Secondary | ICD-10-CM | POA: Diagnosis not present

## 2020-04-18 DIAGNOSIS — M9904 Segmental and somatic dysfunction of sacral region: Secondary | ICD-10-CM | POA: Diagnosis not present

## 2020-04-18 DIAGNOSIS — M9903 Segmental and somatic dysfunction of lumbar region: Secondary | ICD-10-CM | POA: Diagnosis not present

## 2020-04-30 DIAGNOSIS — L570 Actinic keratosis: Secondary | ICD-10-CM | POA: Diagnosis not present

## 2020-04-30 DIAGNOSIS — C4442 Squamous cell carcinoma of skin of scalp and neck: Secondary | ICD-10-CM | POA: Diagnosis not present

## 2020-04-30 DIAGNOSIS — Z85828 Personal history of other malignant neoplasm of skin: Secondary | ICD-10-CM | POA: Diagnosis not present

## 2020-05-23 DIAGNOSIS — D2371 Other benign neoplasm of skin of right lower limb, including hip: Secondary | ICD-10-CM | POA: Diagnosis not present

## 2020-05-23 DIAGNOSIS — M792 Neuralgia and neuritis, unspecified: Secondary | ICD-10-CM | POA: Diagnosis not present

## 2020-05-23 DIAGNOSIS — M21622 Bunionette of left foot: Secondary | ICD-10-CM | POA: Diagnosis not present

## 2020-05-23 DIAGNOSIS — M21621 Bunionette of right foot: Secondary | ICD-10-CM | POA: Diagnosis not present

## 2020-05-28 ENCOUNTER — Telehealth: Payer: Self-pay | Admitting: Cardiology

## 2020-05-28 DIAGNOSIS — Z0000 Encounter for general adult medical examination without abnormal findings: Secondary | ICD-10-CM | POA: Diagnosis not present

## 2020-05-28 DIAGNOSIS — F322 Major depressive disorder, single episode, severe without psychotic features: Secondary | ICD-10-CM | POA: Diagnosis not present

## 2020-05-28 DIAGNOSIS — E7800 Pure hypercholesterolemia, unspecified: Secondary | ICD-10-CM | POA: Diagnosis not present

## 2020-05-28 DIAGNOSIS — R600 Localized edema: Secondary | ICD-10-CM | POA: Diagnosis not present

## 2020-05-28 DIAGNOSIS — E039 Hypothyroidism, unspecified: Secondary | ICD-10-CM | POA: Diagnosis not present

## 2020-05-28 DIAGNOSIS — Z23 Encounter for immunization: Secondary | ICD-10-CM | POA: Diagnosis not present

## 2020-05-28 DIAGNOSIS — G479 Sleep disorder, unspecified: Secondary | ICD-10-CM | POA: Diagnosis not present

## 2020-05-28 DIAGNOSIS — I10 Essential (primary) hypertension: Secondary | ICD-10-CM | POA: Diagnosis not present

## 2020-05-28 NOTE — Telephone Encounter (Signed)
Returned the call to the patient. She stated that she has had swelling since she started the Amlodipine 10 mg. She has been having bilateral edema that progresses throughout the day. Overnight the swelling goes down. The patient is asymptomatic and denies a weight gain. She does limit her salt intake and elevates her legs.  PCP was seen today and advised the patient to call her cardiologist about the swelling and maybe a replacement for the Amlodipine if that is the cause.

## 2020-05-28 NOTE — Telephone Encounter (Signed)
Pt c/o swelling: STAT is pt has developed SOB within 24 hours  1) How much weight have you gained and in what time span? Has not gained weight  2) If swelling, where is the swelling located? Feet and ankles and up to knee  3) Are you currently taking a fluid pill? no  4) Are you currently SOB? no  5) Do you have a log of your daily weights (if so, list)? no  6) Have you gained 3 pounds in a day or 5 pounds in a week? no  7) Have you traveled recently? no   Patient states the swelling in her feet and ankles has gotten worse and is now up to her knees. She states she has not gained weight and is not having any other symptoms.

## 2020-06-06 NOTE — Telephone Encounter (Signed)
Spoke with pt who report she has since decreased her amlodipine to 5 mg and feel the swelling is better but still experiencing some swelling throughout the day. Pt denies SOB or any other symptoms. Nurse advised pt to keep schedule appointment with Dr Harrell Gave on 9/27 and monitor BP. Pt verbalized understanding.

## 2020-06-26 DIAGNOSIS — Z1231 Encounter for screening mammogram for malignant neoplasm of breast: Secondary | ICD-10-CM | POA: Diagnosis not present

## 2020-07-01 ENCOUNTER — Encounter: Payer: Self-pay | Admitting: Cardiology

## 2020-07-01 ENCOUNTER — Ambulatory Visit (INDEPENDENT_AMBULATORY_CARE_PROVIDER_SITE_OTHER): Payer: Medicare Other | Admitting: Cardiology

## 2020-07-01 ENCOUNTER — Other Ambulatory Visit: Payer: Self-pay

## 2020-07-01 VITALS — BP 140/61 | HR 80 | Temp 97.3°F | Ht 62.0 in | Wt 121.8 lb

## 2020-07-01 DIAGNOSIS — Z79899 Other long term (current) drug therapy: Secondary | ICD-10-CM

## 2020-07-01 DIAGNOSIS — Z7189 Other specified counseling: Secondary | ICD-10-CM | POA: Diagnosis not present

## 2020-07-01 DIAGNOSIS — I10 Essential (primary) hypertension: Secondary | ICD-10-CM

## 2020-07-01 MED ORDER — AMLODIPINE BESYLATE 5 MG PO TABS: 5.0000 mg | ORAL_TABLET | Freq: Every day | ORAL | 3 refills | Status: DC

## 2020-07-01 NOTE — Patient Instructions (Addendum)
Medication Instructions:  Decrease Amlodipine to 5 mg daily  We would like blood pressure to average less than 130/80. If you are consistently seeing more numbers in the 140s-150s on top, I would recommend adding a low dose of another medication.  *If you need a refill on your cardiac medications before your next appointment, please call your pharmacy*   Lab Work: None ordered  Testing/Procedures: None ordered   Follow-Up: At [facility], you and your health needs are our priority.  As part of our continuing mission to provide you with exceptional heart care, we have created designated Provider Care Teams.  These Care Teams include your primary Cardiologist (physician) and Advanced Practice Providers (APPs -  Physician Assistants and Nurse Practitioners) who all work together to provide you with the care you need, when you need it.  We recommend signing up for the patient portal called "MyChart".  Sign up information is provided on this After Visit Summary.  MyChart is used to connect with patients for Virtual Visits (Telemedicine).  Patients are able to view lab/test results, encounter notes, upcoming appointments, etc.  Non-urgent messages can be sent to your provider as well.   To learn more about what you can do with MyChart, go to [email or web address].    Your next appointment:   3 month(s)  The format for your next appointment:   In Person  Provider:   Buford Dresser, MD   Other Instructions

## 2020-07-01 NOTE — Progress Notes (Addendum)
Cardiology Office Note:    Date:  07/01/2020   ID:  Tiffany Fox, DOB [birth year], MRN [id number]  PCP:  Tiffany Nip, MD  Cardiologist:  Tiffany Dresser, MD PhD  Referring MD: Tiffany Nip, MD   CC: follow up  History of Present Illness:    Tiffany Fox is a 86 y.o. female with a hx of insomnia, arthritis s/p knee replacement, hypertension.  Initial consult 11/30/18 for tachycardia and orthostatic hypotension after knee surgery. We also discussed hypertension. Plan at that time was to stop fish oil (had chronic diarrhea), if diarrhea improved trial stopping cholestyramine. Also discussed her medication interaction risk, referred her back to PCP to review these. Discussed gradual increase in activity, monitoring heart rate. Also discussed LDL, but she is not interested in statins, and given her age this is reasonable based on her preference.  Today: Has had worsening leg swelling. Brings a picture with her today. Better in the AM but not completely resolved. Cut her amlodipine in half and this swelling resolved.   Brings in BP log today, range 127/75-149/84. We discussed that ideally goal is <130/80, and she is slightly above this today in the office and has several readings above this at home.  Had a rash on lisinopril; was terrible, told not to be on it again. No anaphylaxis, just whole body extreme pruritis. Was on thiazide while pregnant for swelling. Already fatigued, wants to avoid a beta blocker. We discussed chlorthalidone and spironolactone as well, but she already feels like she is on too much medication.  Denies chest pain, shortness of breath at rest or with normal exertion. No PND, orthopnea, recurrent LE edema or unexpected weight gain. No syncope or palpitations.  Past Medical History:  Diagnosis Date  . Anxiety   . Arthritis   . Cervical spondylosis   . Depression   . Hypertension   . Hypothyroidism   . Leukocytopenia   . Osteoporosis      Past Surgical History:  Procedure Laterality Date  . APPENDECTOMY  2012  . EYE SURGERY Bilateral 2012  . FRACTURE SURGERY    . HIP FRACTURE SURGERY Left 05.2011  . JOINT REPLACEMENT    . TOTAL KNEE ARTHROPLASTY Right 10/18/2018  . TOTAL KNEE ARTHROPLASTY Right 10/18/2018   Procedure: TOTAL KNEE ARTHROPLASTY;  Surgeon: Tiffany Nakayama, MD;  Location: [place];  Service: Orthopedics;  Laterality: Right;    Current Medications: Current Outpatient Medications on File Prior to Visit  Medication Sig  . Cholecalciferol (VITAMIN D-1000 MAX ST PO) Take 1 tablet by mouth daily.  . clonazePAM (KLONOPIN) 0.5 MG tablet Take 0.5 mg by mouth daily. FOR SLEEP  . colestipol (COLESTID) 1 g tablet Take 4 tablets by mouth as directed.  . escitalopram (LEXAPRO) 10 MG tablet daily. Pt takes 1 tablet and a half tablet daily  . eszopiclone (LUNESTA) 2 MG TABS tablet Take 2 mg by mouth at bedtime. Take immediately before bedtime  . Multiple Vitamins-Minerals (HAIR/SKIN/NAILS PO) Take 1 tablet by mouth daily.  Marland Kitchen thyroid (ARMOUR) 60 MG tablet Take 60 mg by mouth daily before breakfast.    No current facility-administered medications on file prior to visit.     Allergies:   Lisinopril   Social History   Tobacco Use  . Smoking status: Never Smoker  . Smokeless tobacco: Never Used  Vaping Use  . Vaping Use: Never used  Substance Use Topics  . Alcohol use: Yes    Alcohol/week: 2.0 standard drinks  Types: 2 Glasses of wine per week    Comment: wine   . Drug use: No    Family History: The patient's family history includes Heart disease in her brother, brother, and maternal grandfather; Stroke in her father.  ROS:   Please see the history of present illness.  Additional pertinent ROS otherwise unremarkable.  EKGs/Labs/Other Studies Reviewed:    The following studies were reviewed today: No cardiac studies (vascular u/s from 2020 reviewed)  EKG:  EKG is personally reviewed.  The ekg ordered  today demonstrates normal sinus rhythm at 80 bpm  Recent Labs: No results found for requested labs within last 8760 hours.  Recent Lipid Panel No results found for: CHOL, TRIG, HDL, CHOLHDL, VLDL, LDLCALC, LDLDIRECT  Physical Exam:    VS:  BP 140/61   Pulse 80   Temp (!) 97.3 F (36.3 C)   Ht 5\' 2"  (1.575 m)   Wt 121 lb 12.8 oz (55.2 kg)   SpO2 97%   BMI 22.28 kg/m     Wt Readings from Last 3 Encounters:  07/01/20 121 lb 12.8 oz (55.2 kg)  02/13/20 123 lb 9.6 oz (56.1 kg)  07/24/19 126 lb (57.2 kg)    GEN: Well nourished, well developed in no acute distress HEENT: Normal, moist mucous membranes NECK: No JVD CARDIAC: regular rhythm, normal S1 and S2, no rubs or gallops. No murmur. VASCULAR: Radial and DP pulses 2+ bilaterally. No carotid bruits RESPIRATORY:  Clear to auscultation without rales, wheezing or rhonchi  ABDOMEN: Soft, non-tender, non-distended MUSCULOSKELETAL:  Ambulates independently SKIN: Warm and dry, no edema NEUROLOGIC:  Alert and oriented x 3. No focal neuro deficits noted. PSYCHIATRIC:  Normal affect   ASSESSMENT:    1. Essential hypertension   2. Medication management   3. Cardiac risk counseling   4. Counseling on health promotion and disease prevention    PLAN:    Hypertension:  -BP much improved on amlodipine 10 mg daily, but had significant LE edema -goal <130/80 -continue amlodipine 5 mg daily, as she does not have significant LE edema on this dose. -we discussed two options today: no changes to medications vs. Starting additional medication. She wishes to hold on adding any medication today. She will chart her BP numbers and call me if they are elevated -if she needs additional medication, would consider spironolactone given prior borderline low K vs. Chlorthalidone. Would need BMET recheck in either case.  CV risk counseling and prevention: -recommend heart healthy/Mediterranean diet, with whole grains, fruits, vegetable, fish, lean  meats, nuts, and olive oil. Limit salt. -recommend moderate walking, 3-5 times/week for 30-50 minutes each session. Aim for at least 150 minutes.week. Goal should be pace of 3 miles/hours, or walking 1.5 miles in 30 minutes -recommend avoidance of tobacco products. Avoid excess alcohol. -ASCVD risk score: The ASCVD Risk score Mikey Bussing DC Jr., et al., 2013) failed to calculate for the following reasons:   The 2013 ASCVD risk score is only valid for ages 70 to 38   Not addressed today: Syncope: no recurrence -two prior episodes, both outside in the parking lot in warm weather. She had a prodrome of lightheadedness/dizzyness. Similar to prior distant events. Likely reflex syncope. -not related to sensation of palpitations -no further episodes  Plan for follow up: 3 mos or sooner as needed  Medication Adjustments/Labs and Tests Ordered: Current medicines are reviewed at length with the patient today.  Concerns regarding medicines are outlined above.  Orders Placed This Encounter  Procedures  .  EKG 12-Lead   Meds ordered this encounter  Medications  . amLODipine (NORVASC) 5 MG tablet    Sig: Take 1 tablet (5 mg total) by mouth daily.    Dispense:  180 tablet    Refill:  3    Patient Instructions  Medication Instructions:  Decrease Amlodipine to 5 mg daily  We would like blood pressure to average less than 130/80. If you are consistently seeing more numbers in the 140s-150s on top, I would recommend adding a low dose of another medication.  *If you need a refill on your cardiac medications before your next appointment, please call your pharmacy*   Lab Work: None ordered  Testing/Procedures: None ordered   Follow-Up: At [facility], you and your health needs are our priority.  As part of our continuing mission to provide you with exceptional heart care, we have created designated Provider Care Teams.  These Care Teams include your primary Cardiologist (physician) and Advanced  Practice Providers (APPs -  Physician Assistants and Nurse Practitioners) who all work together to provide you with the care you need, when you need it.  We recommend signing up for the patient portal called "MyChart".  Sign up information is provided on this After Visit Summary.  MyChart is used to connect with patients for Virtual Visits (Telemedicine).  Patients are able to view lab/test results, encounter notes, upcoming appointments, etc.  Non-urgent messages can be sent to your provider as well.   To learn more about what you can do with MyChart, go to [email or web address].    Your next appointment:   3 month(s)  The format for your next appointment:   In Person  Provider:   Buford Dresser, MD   Other Instructions     Signed, Tiffany Dresser, MD PhD 07/01/2020 [place]

## 2020-08-13 DIAGNOSIS — M810 Age-related osteoporosis without current pathological fracture: Secondary | ICD-10-CM | POA: Diagnosis not present

## 2020-08-13 DIAGNOSIS — F322 Major depressive disorder, single episode, severe without psychotic features: Secondary | ICD-10-CM | POA: Diagnosis not present

## 2020-08-13 DIAGNOSIS — E7800 Pure hypercholesterolemia, unspecified: Secondary | ICD-10-CM | POA: Diagnosis not present

## 2020-08-13 DIAGNOSIS — E039 Hypothyroidism, unspecified: Secondary | ICD-10-CM | POA: Diagnosis not present

## 2020-08-13 DIAGNOSIS — I10 Essential (primary) hypertension: Secondary | ICD-10-CM | POA: Diagnosis not present

## 2020-08-19 DIAGNOSIS — F332 Major depressive disorder, recurrent severe without psychotic features: Secondary | ICD-10-CM | POA: Diagnosis not present

## 2020-08-21 DIAGNOSIS — L570 Actinic keratosis: Secondary | ICD-10-CM | POA: Diagnosis not present

## 2020-08-21 DIAGNOSIS — L853 Xerosis cutis: Secondary | ICD-10-CM | POA: Diagnosis not present

## 2020-08-21 DIAGNOSIS — L304 Erythema intertrigo: Secondary | ICD-10-CM | POA: Diagnosis not present

## 2020-08-21 DIAGNOSIS — D485 Neoplasm of uncertain behavior of skin: Secondary | ICD-10-CM | POA: Diagnosis not present

## 2020-08-21 DIAGNOSIS — L821 Other seborrheic keratosis: Secondary | ICD-10-CM | POA: Diagnosis not present

## 2020-08-21 DIAGNOSIS — L710 Perioral dermatitis: Secondary | ICD-10-CM | POA: Diagnosis not present

## 2020-08-21 DIAGNOSIS — Z85828 Personal history of other malignant neoplasm of skin: Secondary | ICD-10-CM | POA: Diagnosis not present

## 2020-08-21 DIAGNOSIS — L814 Other melanin hyperpigmentation: Secondary | ICD-10-CM | POA: Diagnosis not present

## 2020-08-21 DIAGNOSIS — D1801 Hemangioma of skin and subcutaneous tissue: Secondary | ICD-10-CM | POA: Diagnosis not present

## 2020-08-22 DIAGNOSIS — M21622 Bunionette of left foot: Secondary | ICD-10-CM | POA: Diagnosis not present

## 2020-08-22 DIAGNOSIS — M21621 Bunionette of right foot: Secondary | ICD-10-CM | POA: Diagnosis not present

## 2020-08-22 DIAGNOSIS — M792 Neuralgia and neuritis, unspecified: Secondary | ICD-10-CM | POA: Diagnosis not present

## 2020-08-22 DIAGNOSIS — D2371 Other benign neoplasm of skin of right lower limb, including hip: Secondary | ICD-10-CM | POA: Diagnosis not present

## 2020-09-25 DIAGNOSIS — K529 Noninfective gastroenteritis and colitis, unspecified: Secondary | ICD-10-CM | POA: Diagnosis not present

## 2020-09-25 DIAGNOSIS — R140 Abdominal distension (gaseous): Secondary | ICD-10-CM | POA: Diagnosis not present

## 2020-09-25 DIAGNOSIS — Z8619 Personal history of other infectious and parasitic diseases: Secondary | ICD-10-CM | POA: Diagnosis not present

## 2020-10-09 ENCOUNTER — Encounter: Payer: Self-pay | Admitting: Cardiology

## 2020-10-09 ENCOUNTER — Ambulatory Visit (INDEPENDENT_AMBULATORY_CARE_PROVIDER_SITE_OTHER): Payer: Medicare Other | Admitting: Cardiology

## 2020-10-09 ENCOUNTER — Other Ambulatory Visit: Payer: Self-pay

## 2020-10-09 VITALS — BP 128/72 | HR 74 | Ht 62.0 in | Wt 126.6 lb

## 2020-10-09 DIAGNOSIS — Z7189 Other specified counseling: Secondary | ICD-10-CM

## 2020-10-09 DIAGNOSIS — Z87898 Personal history of other specified conditions: Secondary | ICD-10-CM

## 2020-10-09 DIAGNOSIS — Z23 Encounter for immunization: Secondary | ICD-10-CM | POA: Diagnosis not present

## 2020-10-09 DIAGNOSIS — I10 Essential (primary) hypertension: Secondary | ICD-10-CM

## 2020-10-09 MED ORDER — AMLODIPINE BESYLATE 5 MG PO TABS: 5.0000 mg | ORAL_TABLET | Freq: Every day | ORAL | 3 refills | Status: DC

## 2020-10-09 NOTE — Progress Notes (Signed)
Cardiology Office Note:    Date:  10/09/2020   ID:  Tiffany Fox, DOB 1933-11-23, MRN [id number]  PCP:  Clayborn Heron, MD  Cardiologist:  Jodelle Red, MD PhD  Referring MD: Clayborn Heron, MD   CC: follow up  History of Present Illness:    Tiffany Fox is a 87 y.o. female with a hx of insomnia, arthritis s/p knee replacement, hypertension.  Initial consult 11/30/18 for tachycardia and orthostatic hypotension after knee surgery. We also discussed hypertension. Plan at that time was to stop fish oil (had chronic diarrhea), if diarrhea improved trial stopping cholestyramine. Also discussed her medication interaction risk, referred her back to PCP to review these. Discussed gradual increase in activity, monitoring heart rate. Also discussed LDL, but she is not interested in statins, and given her age this is reasonable based on her preference.  Cardiac history: had severe swelling in 10 mg amlodipine but tolerates 5 mg well. Had a rash on lisinopril; was terrible, told not to be on it again. No anaphylaxis, just whole body extreme pruritis. Was on thiazide while pregnant for swelling. Already fatigued, wants to avoid a beta blocker.   Today:  Doing well overall. Blood pressures have been well controlled at home, brings a log today. Highest 148 systolic in November, rest in 086-578I systolic.  Having a lot of stress/anxiety, but coping as best she can.  Denies chest pain, shortness of breath at rest or with normal exertion. No PND, orthopnea, LE edema or unexpected weight gain. No syncope or palpitations.  Past Medical History:  Diagnosis Date  . Anxiety   . Arthritis   . Cervical spondylosis   . Depression   . Hypertension   . Hypothyroidism   . Leukocytopenia   . Osteoporosis     Past Surgical History:  Procedure Laterality Date  . APPENDECTOMY  2012  . EYE SURGERY Bilateral 2012  . FRACTURE SURGERY    . HIP FRACTURE SURGERY Left 05.2011  . JOINT  REPLACEMENT    . TOTAL KNEE ARTHROPLASTY Right 10/18/2018  . TOTAL KNEE ARTHROPLASTY Right 10/18/2018   Procedure: TOTAL KNEE ARTHROPLASTY;  Surgeon: Marcene Corning, MD;  Location: [facility];  Service: Orthopedics;  Laterality: Right;    Current Medications: Current Outpatient Medications on File Prior to Visit  Medication Sig  . amLODipine (NORVASC) 5 MG tablet Take 1 tablet (5 mg total) by mouth daily.  . Cholecalciferol (VITAMIN D-1000 MAX ST PO) Take 1 tablet by mouth daily.  . clonazePAM (KLONOPIN) 0.5 MG tablet Take 0.5 mg by mouth daily. FOR SLEEP  . colestipol (COLESTID) 1 g tablet Take 4 tablets by mouth as directed.  . eszopiclone (LUNESTA) 2 MG TABS tablet Take 2 mg by mouth at bedtime. Take immediately before bedtime  . Eszopiclone 3 MG TABS Take 3 mg by mouth at bedtime as needed.  . Multiple Vitamins-Minerals (HAIR/SKIN/NAILS PO) Take 1 tablet by mouth daily.  Marland Kitchen thyroid (ARMOUR) 60 MG tablet Take 60 mg by mouth daily before breakfast.    No current facility-administered medications on file prior to visit.     Allergies:   Lisinopril   Social History   Tobacco Use  . Smoking status: Never Smoker  . Smokeless tobacco: Never Used  Vaping Use  . Vaping Use: Never used  Substance Use Topics  . Alcohol use: Yes    Alcohol/week: 2.0 standard drinks    Types: 2 Glasses of wine per week    Comment: wine   .  Drug use: No    Family History: The patient's family history includes Heart disease in her brother, brother, and maternal grandfather; Stroke in her father.  ROS:   Please see the history of present illness.  Additional pertinent ROS otherwise unremarkable.  EKGs/Labs/Other Studies Reviewed:    The following studies were reviewed today: No cardiac studies (vascular u/s from 2020 reviewed)  EKG:  EKG is personally reviewed.  The ekg ordered 07/01/20 demonstrates normal sinus rhythm at 80 bpm  Recent Labs: No results found for requested labs within last 8760  hours.  Recent Lipid Panel No results found for: CHOL, TRIG, HDL, CHOLHDL, VLDL, LDLCALC, LDLDIRECT  Physical Exam:    VS:  BP 128/72   Pulse 74   Ht 5\' 2"  (1.575 m)   Wt 126 lb 9.6 oz (57.4 kg)   BMI 23.16 kg/m     Wt Readings from Last 3 Encounters:  10/09/20 126 lb 9.6 oz (57.4 kg)  07/01/20 121 lb 12.8 oz (55.2 kg)  02/13/20 123 lb 9.6 oz (56.1 kg)    GEN: Well nourished, well developed in no acute distress HEENT: Normal, moist mucous membranes NECK: No JVD CARDIAC: regular rhythm, normal S1 and S2, no rubs or gallops. No murmur. VASCULAR: Radial and DP pulses 2+ bilaterally. No carotid bruits RESPIRATORY:  Clear to auscultation without rales, wheezing or rhonchi  ABDOMEN: Soft, non-tender, non-distended MUSCULOSKELETAL:  Ambulates independently SKIN: Warm and dry, no edema NEUROLOGIC:  Alert and oriented x 3. No focal neuro deficits noted. PSYCHIATRIC:  Normal affect    ASSESSMENT:    1. Essential hypertension   2. History of syncope   3. Cardiac risk counseling   4. Counseling on health promotion and disease prevention    PLAN:    Hypertension:  -BP much improved on amlodipine 10 mg daily, but had significant LE edema. No significant edema on 5 mg daily amlodipine, continue -goal <130/80, at goal today -if she needs additional medication, would consider spironolactone given prior borderline low K vs. Chlorthalidone. Would need BMET recheck in either case.  CV risk counseling and prevention: -recommend heart healthy/Mediterranean diet, with whole grains, fruits, vegetable, fish, lean meats, nuts, and olive oil. Limit salt. -recommend moderate walking, 3-5 times/week for 30-50 minutes each session. Aim for at least 150 minutes.week. Goal should be pace of 3 miles/hours, or walking 1.5 miles in 30 minutes -recommend avoidance of tobacco products. Avoid excess alcohol. -ASCVD risk score: The ASCVD Risk score Mikey Bussing DC Jr., et al., 2013) failed to calculate for the  following reasons:   The 2013 ASCVD risk score is only valid for ages 15 to 73   Syncope: no recurrence -two prior episodes, both outside in the parking lot in warm weather. She had a prodrome of lightheadedness/dizzyness. Similar to prior distant events. Likely reflex syncope. -not related to sensation of palpitations -no further episodes  Plan for follow up: 1 year or sooner as needed  Medication Adjustments/Labs and Tests Ordered: Current medicines are reviewed at length with the patient today.  Concerns regarding medicines are outlined above.  No orders of the defined types were placed in this encounter.  Meds ordered this encounter  Medications  . amLODipine (NORVASC) 5 MG tablet    Sig: Take 1 tablet (5 mg total) by mouth daily.    Dispense:  90 tablet    Refill:  3    Patient Instructions  Medication Instructions:  Your Physician recommend you continue on your current medication as directed.    *  If you need a refill on your cardiac medications before your next appointment, please call your pharmacy*   Lab Work: None   Testing/Procedures: None   Follow-Up: At [facility], you and your health needs are our priority.  As part of our continuing mission to provide you with exceptional heart care, we have created designated Provider Care Teams.  These Care Teams include your primary Cardiologist (physician) and Advanced Practice Providers (APPs -  Physician Assistants and Nurse Practitioners) who all work together to provide you with the care you need, when you need it.  We recommend signing up for the patient portal called "MyChart".  Sign up information is provided on this After Visit Summary.  MyChart is used to connect with patients for Virtual Visits (Telemedicine).  Patients are able to view lab/test results, encounter notes, upcoming appointments, etc.  Non-urgent messages can be sent to your provider as well.   To learn more about what you can do with MyChart, go  to [email or web address].    Your next appointment:   1 year(s)  The format for your next appointment:   In Person  Provider:   Buford Dresser, MD        Signed, Buford Dresser, MD PhD 10/09/2020 [place]

## 2020-10-09 NOTE — Patient Instructions (Signed)

## 2020-10-10 DIAGNOSIS — F322 Major depressive disorder, single episode, severe without psychotic features: Secondary | ICD-10-CM | POA: Diagnosis not present

## 2020-10-10 DIAGNOSIS — I10 Essential (primary) hypertension: Secondary | ICD-10-CM | POA: Diagnosis not present

## 2020-10-10 DIAGNOSIS — E7800 Pure hypercholesterolemia, unspecified: Secondary | ICD-10-CM | POA: Diagnosis not present

## 2020-10-10 DIAGNOSIS — E039 Hypothyroidism, unspecified: Secondary | ICD-10-CM | POA: Diagnosis not present

## 2020-10-10 DIAGNOSIS — M810 Age-related osteoporosis without current pathological fracture: Secondary | ICD-10-CM | POA: Diagnosis not present

## 2020-10-15 DIAGNOSIS — R140 Abdominal distension (gaseous): Secondary | ICD-10-CM | POA: Diagnosis not present

## 2020-10-15 DIAGNOSIS — R197 Diarrhea, unspecified: Secondary | ICD-10-CM | POA: Diagnosis not present

## 2020-11-06 DIAGNOSIS — D485 Neoplasm of uncertain behavior of skin: Secondary | ICD-10-CM | POA: Diagnosis not present

## 2020-11-06 DIAGNOSIS — C44622 Squamous cell carcinoma of skin of right upper limb, including shoulder: Secondary | ICD-10-CM | POA: Diagnosis not present

## 2020-11-06 DIAGNOSIS — Z85828 Personal history of other malignant neoplasm of skin: Secondary | ICD-10-CM | POA: Diagnosis not present

## 2020-11-18 DIAGNOSIS — K6389 Other specified diseases of intestine: Secondary | ICD-10-CM | POA: Diagnosis not present

## 2020-11-18 DIAGNOSIS — K900 Celiac disease: Secondary | ICD-10-CM | POA: Diagnosis not present

## 2020-12-02 DIAGNOSIS — E039 Hypothyroidism, unspecified: Secondary | ICD-10-CM | POA: Diagnosis not present

## 2020-12-02 DIAGNOSIS — I10 Essential (primary) hypertension: Secondary | ICD-10-CM | POA: Diagnosis not present

## 2020-12-02 DIAGNOSIS — G479 Sleep disorder, unspecified: Secondary | ICD-10-CM | POA: Diagnosis not present

## 2020-12-02 DIAGNOSIS — K6389 Other specified diseases of intestine: Secondary | ICD-10-CM | POA: Diagnosis not present

## 2020-12-05 DIAGNOSIS — L570 Actinic keratosis: Secondary | ICD-10-CM | POA: Diagnosis not present

## 2020-12-05 DIAGNOSIS — D485 Neoplasm of uncertain behavior of skin: Secondary | ICD-10-CM | POA: Diagnosis not present

## 2020-12-05 DIAGNOSIS — Z85828 Personal history of other malignant neoplasm of skin: Secondary | ICD-10-CM | POA: Diagnosis not present

## 2020-12-05 DIAGNOSIS — C44622 Squamous cell carcinoma of skin of right upper limb, including shoulder: Secondary | ICD-10-CM | POA: Diagnosis not present

## 2020-12-10 DIAGNOSIS — G4700 Insomnia, unspecified: Secondary | ICD-10-CM | POA: Diagnosis not present

## 2020-12-10 DIAGNOSIS — F332 Major depressive disorder, recurrent severe without psychotic features: Secondary | ICD-10-CM | POA: Diagnosis not present

## 2020-12-16 DIAGNOSIS — M21622 Bunionette of left foot: Secondary | ICD-10-CM | POA: Diagnosis not present

## 2020-12-16 DIAGNOSIS — M792 Neuralgia and neuritis, unspecified: Secondary | ICD-10-CM | POA: Diagnosis not present

## 2020-12-16 DIAGNOSIS — M21621 Bunionette of right foot: Secondary | ICD-10-CM | POA: Diagnosis not present

## 2020-12-16 DIAGNOSIS — D2371 Other benign neoplasm of skin of right lower limb, including hip: Secondary | ICD-10-CM | POA: Diagnosis not present

## 2020-12-25 DIAGNOSIS — H40013 Open angle with borderline findings, low risk, bilateral: Secondary | ICD-10-CM | POA: Diagnosis not present

## 2021-01-01 DIAGNOSIS — E7800 Pure hypercholesterolemia, unspecified: Secondary | ICD-10-CM | POA: Diagnosis not present

## 2021-01-01 DIAGNOSIS — E039 Hypothyroidism, unspecified: Secondary | ICD-10-CM | POA: Diagnosis not present

## 2021-01-01 DIAGNOSIS — F322 Major depressive disorder, single episode, severe without psychotic features: Secondary | ICD-10-CM | POA: Diagnosis not present

## 2021-01-01 DIAGNOSIS — I10 Essential (primary) hypertension: Secondary | ICD-10-CM | POA: Diagnosis not present

## 2021-01-01 DIAGNOSIS — M810 Age-related osteoporosis without current pathological fracture: Secondary | ICD-10-CM | POA: Diagnosis not present

## 2021-01-13 DIAGNOSIS — F332 Major depressive disorder, recurrent severe without psychotic features: Secondary | ICD-10-CM | POA: Diagnosis not present

## 2021-01-16 DIAGNOSIS — S0083XA Contusion of other part of head, initial encounter: Secondary | ICD-10-CM | POA: Diagnosis not present

## 2021-01-16 DIAGNOSIS — K529 Noninfective gastroenteritis and colitis, unspecified: Secondary | ICD-10-CM | POA: Diagnosis not present

## 2021-01-16 DIAGNOSIS — W19XXXA Unspecified fall, initial encounter: Secondary | ICD-10-CM | POA: Diagnosis not present

## 2021-01-16 DIAGNOSIS — K5900 Constipation, unspecified: Secondary | ICD-10-CM | POA: Diagnosis not present

## 2021-01-27 DIAGNOSIS — C44612 Basal cell carcinoma of skin of right upper limb, including shoulder: Secondary | ICD-10-CM | POA: Diagnosis not present

## 2021-01-27 DIAGNOSIS — Z85828 Personal history of other malignant neoplasm of skin: Secondary | ICD-10-CM | POA: Diagnosis not present

## 2021-03-10 DIAGNOSIS — F332 Major depressive disorder, recurrent severe without psychotic features: Secondary | ICD-10-CM | POA: Diagnosis not present

## 2021-03-17 DIAGNOSIS — M25552 Pain in left hip: Secondary | ICD-10-CM | POA: Diagnosis not present

## 2021-03-17 DIAGNOSIS — M1612 Unilateral primary osteoarthritis, left hip: Secondary | ICD-10-CM | POA: Diagnosis not present

## 2021-05-20 DIAGNOSIS — F332 Major depressive disorder, recurrent severe without psychotic features: Secondary | ICD-10-CM | POA: Diagnosis not present

## 2021-05-28 DIAGNOSIS — E039 Hypothyroidism, unspecified: Secondary | ICD-10-CM | POA: Diagnosis not present

## 2021-05-28 DIAGNOSIS — I10 Essential (primary) hypertension: Secondary | ICD-10-CM | POA: Diagnosis not present

## 2021-05-28 DIAGNOSIS — M810 Age-related osteoporosis without current pathological fracture: Secondary | ICD-10-CM | POA: Diagnosis not present

## 2021-05-28 DIAGNOSIS — F322 Major depressive disorder, single episode, severe without psychotic features: Secondary | ICD-10-CM | POA: Diagnosis not present

## 2021-05-28 DIAGNOSIS — E7800 Pure hypercholesterolemia, unspecified: Secondary | ICD-10-CM | POA: Diagnosis not present

## 2021-06-03 DIAGNOSIS — G479 Sleep disorder, unspecified: Secondary | ICD-10-CM | POA: Diagnosis not present

## 2021-06-03 DIAGNOSIS — M25552 Pain in left hip: Secondary | ICD-10-CM | POA: Diagnosis not present

## 2021-06-03 DIAGNOSIS — I10 Essential (primary) hypertension: Secondary | ICD-10-CM | POA: Diagnosis not present

## 2021-06-03 DIAGNOSIS — Z0000 Encounter for general adult medical examination without abnormal findings: Secondary | ICD-10-CM | POA: Diagnosis not present

## 2021-06-03 DIAGNOSIS — F322 Major depressive disorder, single episode, severe without psychotic features: Secondary | ICD-10-CM | POA: Diagnosis not present

## 2021-06-03 DIAGNOSIS — E039 Hypothyroidism, unspecified: Secondary | ICD-10-CM | POA: Diagnosis not present

## 2021-06-30 DIAGNOSIS — Z1231 Encounter for screening mammogram for malignant neoplasm of breast: Secondary | ICD-10-CM | POA: Diagnosis not present

## 2021-07-14 DIAGNOSIS — M25552 Pain in left hip: Secondary | ICD-10-CM | POA: Diagnosis not present

## 2021-07-15 DIAGNOSIS — F332 Major depressive disorder, recurrent severe without psychotic features: Secondary | ICD-10-CM | POA: Diagnosis not present

## 2021-07-23 ENCOUNTER — Emergency Department (HOSPITAL_COMMUNITY): Payer: Medicare Other

## 2021-07-23 ENCOUNTER — Emergency Department (HOSPITAL_COMMUNITY)
Admission: EM | Admit: 2021-07-23 | Discharge: 2021-07-23 | Disposition: A | Discharge status: Home / Self Care | Payer: Medicare Other | Attending: Emergency Medicine | Admitting: Emergency Medicine

## 2021-07-23 ENCOUNTER — Other Ambulatory Visit: Payer: Self-pay

## 2021-07-23 DIAGNOSIS — E039 Hypothyroidism, unspecified: Secondary | ICD-10-CM | POA: Insufficient documentation

## 2021-07-23 DIAGNOSIS — S00511A Abrasion of lip, initial encounter: Secondary | ICD-10-CM | POA: Diagnosis not present

## 2021-07-23 DIAGNOSIS — R609 Edema, unspecified: Secondary | ICD-10-CM | POA: Diagnosis not present

## 2021-07-23 DIAGNOSIS — S61012A Laceration without foreign body of left thumb without damage to nail, initial encounter: Secondary | ICD-10-CM | POA: Insufficient documentation

## 2021-07-23 DIAGNOSIS — M47812 Spondylosis without myelopathy or radiculopathy, cervical region: Secondary | ICD-10-CM | POA: Diagnosis not present

## 2021-07-23 DIAGNOSIS — W19XXXA Unspecified fall, initial encounter: Secondary | ICD-10-CM

## 2021-07-23 DIAGNOSIS — I10 Essential (primary) hypertension: Secondary | ICD-10-CM | POA: Diagnosis not present

## 2021-07-23 DIAGNOSIS — R519 Headache, unspecified: Secondary | ICD-10-CM | POA: Insufficient documentation

## 2021-07-23 DIAGNOSIS — S42294A Other nondisplaced fracture of upper end of right humerus, initial encounter for closed fracture: Secondary | ICD-10-CM | POA: Insufficient documentation

## 2021-07-23 DIAGNOSIS — S42251A Displaced fracture of greater tuberosity of right humerus, initial encounter for closed fracture: Secondary | ICD-10-CM | POA: Diagnosis not present

## 2021-07-23 DIAGNOSIS — M189 Osteoarthritis of first carpometacarpal joint, unspecified: Secondary | ICD-10-CM | POA: Diagnosis not present

## 2021-07-23 DIAGNOSIS — M25519 Pain in unspecified shoulder: Secondary | ICD-10-CM | POA: Diagnosis not present

## 2021-07-23 DIAGNOSIS — R58 Hemorrhage, not elsewhere classified: Secondary | ICD-10-CM | POA: Diagnosis not present

## 2021-07-23 DIAGNOSIS — R0902 Hypoxemia: Secondary | ICD-10-CM | POA: Diagnosis not present

## 2021-07-23 DIAGNOSIS — Z23 Encounter for immunization: Secondary | ICD-10-CM | POA: Insufficient documentation

## 2021-07-23 DIAGNOSIS — W010XXA Fall on same level from slipping, tripping and stumbling without subsequent striking against object, initial encounter: Secondary | ICD-10-CM | POA: Diagnosis not present

## 2021-07-23 DIAGNOSIS — I739 Peripheral vascular disease, unspecified: Secondary | ICD-10-CM | POA: Diagnosis not present

## 2021-07-23 DIAGNOSIS — Z043 Encounter for examination and observation following other accident: Secondary | ICD-10-CM | POA: Diagnosis not present

## 2021-07-23 DIAGNOSIS — I700 Atherosclerosis of aorta: Secondary | ICD-10-CM | POA: Diagnosis not present

## 2021-07-23 DIAGNOSIS — M4319 Spondylolisthesis, multiple sites in spine: Secondary | ICD-10-CM | POA: Diagnosis not present

## 2021-07-23 DIAGNOSIS — I6529 Occlusion and stenosis of unspecified carotid artery: Secondary | ICD-10-CM | POA: Diagnosis not present

## 2021-07-23 DIAGNOSIS — S6992XA Unspecified injury of left wrist, hand and finger(s), initial encounter: Secondary | ICD-10-CM | POA: Diagnosis present

## 2021-07-23 MED ORDER — OXYCODONE HCL 5 MG PO TABS: 5.0000 mg | ORAL_TABLET | ORAL | 0 refills | Status: DC | PRN

## 2021-07-23 MED ORDER — ACETAMINOPHEN 500 MG PO TABS
1000.0000 mg | ORAL_TABLET | Freq: Once | ORAL | Status: AC
  Administered 2021-07-23: 1000 mg via ORAL
  Filled 2021-07-23: qty 2

## 2021-07-23 MED ORDER — TETANUS-DIPHTH-ACELL PERTUSSIS 5-2.5-18.5 LF-MCG/0.5 IM SUSY
0.5000 mL | PREFILLED_SYRINGE | Freq: Once | INTRAMUSCULAR | Status: AC
  Administered 2021-07-23: 0.5 mL via INTRAMUSCULAR
  Filled 2021-07-23: qty 0.5

## 2021-07-23 MED ORDER — LIDOCAINE HCL (PF) 1 % IJ SOLN
5.0000 mL | Freq: Once | INTRAMUSCULAR | Status: AC
  Administered 2021-07-23: 5 mL
  Filled 2021-07-23: qty 5

## 2021-07-23 NOTE — ED Provider Notes (Signed)
[facility] Emergency Department Provider Note MRN:  [id number]  Arrival date & time: 07/23/21     Chief Complaint   Fall (To face, right shoulder pain and lip/left thumb lac. -LOC, no thinners)   History of Present Illness   Tiffany Fox is a 87 y.o. year-old female with a history of hypertension presenting to the ED with chief complaint of fall.  Tripped on a rug and fell forward onto her face and hands.  Was holding a drinking glass which broke and she cut her left thumb.  Trauma to the face, abrasion to the lip, denies loss of consciousness.  Major complaint is right shoulder pain, worse with motion or palpation, moderate to severe.  Review of Systems  A complete 10 system review of systems was obtained and all systems are negative except as noted in the HPI and PMH.   Patient's Health History    Past Medical History:  Diagnosis Date   Anxiety    Arthritis    Cervical spondylosis    Depression    Hypertension    Hypothyroidism    Leukocytopenia    Osteoporosis     Past Surgical History:  Procedure Laterality Date   APPENDECTOMY  2012   EYE SURGERY Bilateral 2012   FRACTURE SURGERY     HIP FRACTURE SURGERY Left 05.2011   JOINT REPLACEMENT     TOTAL KNEE ARTHROPLASTY Right 10/18/2018   TOTAL KNEE ARTHROPLASTY Right 10/18/2018   Procedure: TOTAL KNEE ARTHROPLASTY;  Surgeon: Melrose Nakayama, MD;  Location: [facility];  Service: Orthopedics;  Laterality: Right;    Family History  Problem Relation Age of Onset   Stroke Father    Heart disease Brother    Heart disease Maternal Grandfather    Heart disease Brother     Social History   Socioeconomic History   Marital status: Widowed    Spouse name: Not on file   Number of children: Not on file   Years of education: Not on file   Highest education level: Not on file  Occupational History   Not on file  Tobacco Use   Smoking status: Never   Smokeless tobacco: Never  Vaping Use   Vaping  Use: Never used  Substance and Sexual Activity   Alcohol use: Yes    Alcohol/week: 2.0 standard drinks    Types: 2 Glasses of wine per week    Comment: wine    Drug use: No   Sexual activity: Not on file  Other Topics Concern   Not on file  Social History Narrative   Not on file   Social Determinants of Health   Financial Resource Strain: Not on file  Food Insecurity: Not on file  Transportation Needs: Not on file  Physical Activity: Not on file  Stress: Not on file  Social Connections: Not on file  Intimate Partner Violence: Not on file     Physical Exam   Vitals:   07/23/21 0300 07/23/21 0400  BP: (!) 151/81 (!) 145/73  Pulse: 97 96  Resp: 14 15  Temp:    SpO2: 97% 98%    CONSTITUTIONAL: Well-appearing, NAD NEURO:  Alert and oriented x 3, no focal deficits EYES:  eyes equal and reactive ENT/NECK:  no LAD, no JVD CARDIO: Regular rate, well-perfused, normal S1 and S2 PULM:  CTAB no wheezing or rhonchi GI/GU:  normal bowel sounds, non-distended, non-tender MSK/SPINE:  No gross deformities, no edema; tenderness to palpation to the right shoulder with  limited range of motion due to pain SKIN: Laceration to volar surface of the left thumb, abrasion to the right upper lip PSYCH:  Appropriate speech and behavior  *Additional and/or pertinent findings included in MDM below  Diagnostic and Interventional Summary    EKG Interpretation  Date/Time:    Ventricular Rate:    PR Interval:    QRS Duration:   QT Interval:    QTC Calculation:   R Axis:     Text Interpretation:         Labs Reviewed - No data to display  CT HEAD WO CONTRAST (5MM)  Final Result    CT CERVICAL SPINE WO CONTRAST  Final Result    DG Hand Complete Left  Final Result    DG Chest 1 View  Final Result    DG Shoulder Right  Final Result    DG Humerus Right  Final Result      Medications  acetaminophen (TYLENOL) tablet 1,000 mg (1,000 mg Oral Given 07/23/21 0325)  Tdap  (BOOSTRIX) injection 0.5 mL (0.5 mLs Intramuscular Given 07/23/21 0323)  lidocaine (PF) (XYLOCAINE) 1 % injection 5 mL (5 mLs Infiltration Given by Other 07/23/21 0326)     Procedures  /  Critical Care .Marland KitchenLaceration Repair  Date/Time: 07/23/2021 5:05 AM Performed by: Maudie Flakes, MD Authorized by: Maudie Flakes, MD   Consent:    Consent obtained:  Verbal   Consent given by:  Patient   Risks, benefits, and alternatives were discussed: yes     Risks discussed:  Need for additional repair, nerve damage, infection, pain, poor cosmetic result, poor wound healing, vascular damage, tendon damage and retained foreign body Universal protocol:    Procedure explained and questions answered to patient or proxy's satisfaction: yes     Immediately prior to procedure, a time out was called: yes     Patient identity confirmed:  Verbally with patient Anesthesia:    Anesthesia method:  Local infiltration   Local anesthetic:  Lidocaine 1% w/o epi Laceration details:    Location:  Finger   Finger location:  L thumb   Length (cm):  3   Depth (mm):  2 Pre-procedure details:    Preparation:  Patient was prepped and draped in usual sterile fashion and imaging obtained to evaluate for foreign bodies Exploration:    Limited defect created (wound extended): no     Hemostasis achieved with:  Direct pressure   Imaging obtained: x-ray     Imaging outcome: foreign body not noted     Wound exploration: wound explored through full range of motion and entire depth of wound visualized     Contaminated: no   Treatment:    Area cleansed with:  Saline   Amount of cleaning:  Standard Skin repair:    Repair method:  Sutures   Suture size:  4-0   Suture material:  Prolene   Suture technique:  Simple interrupted   Number of sutures:  4 Approximation:    Approximation:  Close Repair type:    Repair type:  Simple Post-procedure details:    Dressing:  Open (no dressing)   Procedure completion:  Tolerated  well, no immediate complications  ED Course and Medical Decision Making  I have reviewed the triage vital signs, the nursing notes, and pertinent available records from the EMR.  Listed above are laboratory and imaging tests that I personally ordered, reviewed, and interpreted and then considered in my medical decision making (see below for details).  Imaging needed to exclude intracranial bleeding, cervical spinal fracture, fractures to the arm, foreign body to the left thumb.  Will need lack repair.     Imaging of the head and neck are reassuring.  X-rays reveal a proximal humerus fracture, will place in splint.  Limb is neurovascularly intact, she is appropriate for follow-up with orthopedics.  Thumb laceration repaired as described above.  Barth Kirks. Sedonia Small, [place] [email or web address]  Final Clinical Impressions(s) / ED Diagnoses     ICD-10-CM   1. Fall, initial encounter  W19.XXXA     2. Abrasion of lip, initial encounter  S00.511A     3. Laceration of left thumb without foreign body, nail damage status unspecified, initial encounter  S61.012A     4. Other closed nondisplaced fracture of proximal end of right humerus, initial encounter  S42.294A       ED Discharge Orders          Ordered    oxyCODONE (ROXICODONE) 5 MG immediate release tablet  Every 4 hours PRN        07/23/21 0510             Discharge Instructions Discussed with and Provided to Patient:     Discharge Instructions      You were evaluated in the Emergency Department and after careful evaluation, we did not find any emergent condition requiring admission or further testing in the hospital.  Your testing today showed a broken arm.  Follow-up with your orthopedic specialist for future management.  The stitches in your left thumb will need to be removed in 5 to 7 days by healthcare professional.  Recommend Tylenol 1000 mg every 4-6 hours and/or  Motrin 600 mg every 4-6 hours for pain.  You can use the oxycodone medication for more significant pain.  Please return to the Emergency Department if you experience any worsening of your condition.   Thank you for allowing [place] to be a part of your care.        Maudie Flakes, MD 07/23/21 [phone]

## 2021-07-23 NOTE — ED Notes (Signed)
RN clarified with Dr Sedonia Small re: pt discharge status d/t right handed cane use and right arm now in sling. Dr Sedonia Small confirms discharge to home as no criteria to keep pt for admission, despite RN safety concerns. RN encouraged pt to call daughters and neighbor to assist at home as pt lives alone and severe potential for further falls. Pt verbalizes understanding of same. Pt calls a taxi to take her home; NT at triage watching to make sure pt gets into taxi without complication. Pt verbalizes understanding of d/c instructions, meds and followup care. Pt leaves with all belongings.

## 2021-07-23 NOTE — ED Triage Notes (Signed)
BIB [facility], pt fell face down from trip/fall over rug. Pt was carrying two cups(glass+plastic), glass broke in left hand. -LOC, c/o right shoulder pain, lip lac and left thumb lac. Denies any blood thinners.

## 2021-07-23 NOTE — Discharge Instructions (Addendum)
You were evaluated in the Emergency Department and after careful evaluation, we did not find any emergent condition requiring admission or further testing in the hospital.  Your testing today showed a broken arm.  Follow-up with your orthopedic specialist for future management.  The stitches in your left thumb will need to be removed in 5 to 7 days by healthcare professional.  Recommend Tylenol 1000 mg every 4-6 hours and/or Motrin 600 mg every 4-6 hours for pain.  You can use the oxycodone medication for more significant pain.  Please return to the Emergency Department if you experience any worsening of your condition.   Thank you for allowing [place] to be a part of your care.

## 2021-07-23 NOTE — Progress Notes (Signed)
Orthopedic Tech Progress Note Patient Details:  DAYANE HILLENBURG 01-22-1934 [id number]  Ortho Devices Type of Ortho Device: Sling immobilizer Ortho Device/Splint Location: rue Ortho Device/Splint Interventions: Ordered, Application, Adjustment   Post Interventions Patient Tolerated: Well Instructions Provided: Care of device, Poper ambulation with device  Mallie Giambra L Henrique Parekh 07/23/2021, 4:45 AM

## 2021-07-26 DIAGNOSIS — S42294A Other nondisplaced fracture of upper end of right humerus, initial encounter for closed fracture: Secondary | ICD-10-CM | POA: Diagnosis not present

## 2021-07-29 DIAGNOSIS — M1612 Unilateral primary osteoarthritis, left hip: Secondary | ICD-10-CM | POA: Diagnosis not present

## 2021-08-06 DIAGNOSIS — E039 Hypothyroidism, unspecified: Secondary | ICD-10-CM | POA: Diagnosis not present

## 2021-08-06 DIAGNOSIS — F322 Major depressive disorder, single episode, severe without psychotic features: Secondary | ICD-10-CM | POA: Diagnosis not present

## 2021-08-06 DIAGNOSIS — E7800 Pure hypercholesterolemia, unspecified: Secondary | ICD-10-CM | POA: Diagnosis not present

## 2021-08-06 DIAGNOSIS — M810 Age-related osteoporosis without current pathological fracture: Secondary | ICD-10-CM | POA: Diagnosis not present

## 2021-08-06 DIAGNOSIS — I10 Essential (primary) hypertension: Secondary | ICD-10-CM | POA: Diagnosis not present

## 2021-08-13 DIAGNOSIS — S42294A Other nondisplaced fracture of upper end of right humerus, initial encounter for closed fracture: Secondary | ICD-10-CM | POA: Diagnosis not present

## 2021-09-01 ENCOUNTER — Other Ambulatory Visit: Payer: Self-pay | Admitting: Orthopedic Surgery

## 2021-09-03 DIAGNOSIS — S42294A Other nondisplaced fracture of upper end of right humerus, initial encounter for closed fracture: Secondary | ICD-10-CM | POA: Diagnosis not present

## 2021-09-05 ENCOUNTER — Telehealth (HOSPITAL_BASED_OUTPATIENT_CLINIC_OR_DEPARTMENT_OTHER): Payer: Self-pay | Admitting: Cardiology

## 2021-09-05 DIAGNOSIS — S42201D Unspecified fracture of upper end of right humerus, subsequent encounter for fracture with routine healing: Secondary | ICD-10-CM | POA: Diagnosis not present

## 2021-09-05 NOTE — Telephone Encounter (Signed)
Pt has been made of needing pre op appt for clearance. I have s/w the pt and moved up her appt for pre op clearance. I did not cancel the appt 10/13/21 with Dr. Harrell Gave. Pt will change appt when she comes in on 09/08/21 for clearance. Pt is agreeable to appt and will see Coletta Memos, FNP 09/08/21 @ 1:55 pm at the [place] location. Pt has been given addressed for [facility] location. Pt thanked me for the call and the help. I will forward notes to FNP for upcoming appt. Will send FYI to requesting office pt appt has been moved up to 09/08/21 for pre op clearance.

## 2021-09-05 NOTE — Telephone Encounter (Signed)
   [facility] Pre-operative Risk Assessment    Patient Name: Tiffany Fox  DOB: Jun 02, 1934 MRN: [id number]  Request for surgical clearance:  What type of surgery is being performed? Revision Left Posterior Hip Arthroplasty and Removal of IM Nail  When is this surgery scheduled? 09/30/21  What type of clearance is required (medical clearance vs. Pharmacy clearance to hold med vs. Both)? Medical  Are there any medications that need to be held prior to surgery and how long? None specified  Practice name and name of physician performing surgery? [facility]; Frederik Pear, MD  What is the office phone number? [phone] [place] Guiles Lang--Surgical Coordinator)   7.   What is the office fax number? [phone]  8.   Anesthesia type (None, local, MAC, general) ? Spinal Anesthesia  **They are requesting office notes, EKG and/or special studies to be sent with this request.   Francella Solian 09/05/2021, 11:24 AM  _________________________________________________________________   (provider comments below)

## 2021-09-05 NOTE — Telephone Encounter (Signed)
Primary Cardiologist:Bridgette Harrell Gave, MD  Chart reviewed as part of pre-operative protocol coverage. Because of Tiffany RIVERE past medical history and time since last visit, he/she will require a follow-up visit in order to better assess preoperative cardiovascular risk.  Pre-op covering staff: - Please schedule appointment and call patient to inform them. - Please contact requesting surgeon's office via preferred method (i.e, phone, fax) to inform them of need for appointment prior to surgery.  If applicable, this message will also be routed to pharmacy pool and/or primary cardiologist for input on holding anticoagulant/antiplatelet agent as requested below so that this information is available at time of patient's appointment.   Deberah Pelton, NP  09/05/2021, 11:57 AM

## 2021-09-07 NOTE — Progress Notes (Signed)
Cardiology Office Note:    Date:  09/08/2021   ID:  Tiffany Fox, DOB 05-Nov-1933, MRN [id number]  PCP:  Tiffany Nip, MD   [name] Providers Cardiologist:  Tiffany Dresser, MD      Referring MD: Tiffany Nip, MD   Presents for preoperative cardiac evaluation and follow-up of her essential hypertension.  History of Present Illness:    Tiffany Fox is a 87 y.o. female with a hx of syncope, essential hypertension, bilateral lower extremity edema, leukocytopenia, and osteoarthritis.  She was seen in follow-up by Dr. Harrell Fox on 10/09/2020.  She had been initially seen by Dr. Harrell Fox 11/30/2018 for tachycardia and orthostatic hypotension after knee surgery.  At that time her fish oil was stopped due to chronic diarrhea.  Increasing her physical activity gradually and monitoring her heart rate was also reviewed.  Her cholesterol was reviewed.  She was not interested in statin therapy.  Due to her age it was felt to be reasonable.  She was tolerating her amlodipine well after reducing from 10 mg to 5 mg.  She had developed a rash on lisinopril.  She also noted whole body pruritus.  She had tried thiazide diuretic while pregnant for swelling.  She discussed wanting to avoid beta-blocker therapy due to fatigue.  Blood pressure was well controlled overall.  She maintained a blood pressure log.  She did note increased anxiety and stress but was coping fairly well.  She denied chest pain, shortness of breath, orthopnea and PND.  She denied syncope and palpitations.  She presents the clinic today for follow-up evaluation and preoperative cardiac evaluation.  She states she feels well.  She is limited in her physical activity due to her right arm fracture and her left hip pain.  We reviewed her upcoming surgery with orthopedics.  She expressed understanding.  She denies further episodes of lightheadedness or dizziness.  Her blood pressure is well controlled at  home.  She is hopeful that she will regain mobility after her hip surgery.  We reviewed her EKG today.  We will continue all of her current medications.  We will plan follow-up for 9 to 12 months.  Today she denies chest pain, shortness of breath, lower extremity edema, fatigue, palpitations, melena, hematuria, hemoptysis, diaphoresis, weakness, presyncope, syncope, orthopnea, and PND.   Past Medical History:  Diagnosis Date   Anxiety    Arthritis    Cervical spondylosis    Depression    Hypertension    Hypothyroidism    Leukocytopenia    Osteoporosis     Past Surgical History:  Procedure Laterality Date   APPENDECTOMY  2012   EYE SURGERY Bilateral 2012   FRACTURE SURGERY     HIP FRACTURE SURGERY Left 05.2011   JOINT REPLACEMENT     TOTAL KNEE ARTHROPLASTY Right 10/18/2018   TOTAL KNEE ARTHROPLASTY Right 10/18/2018   Procedure: TOTAL KNEE ARTHROPLASTY;  Surgeon: Melrose Nakayama, MD;  Location: [facility] [facility];  Service: Orthopedics;  Laterality: Right;    Current Medications: Current Meds  Medication Sig   Calcium-Phosphorus-Vitamin D (CITRACAL +D3 PO) Take 2 tablets by mouth daily.   Cholecalciferol (VITAMIN D-1000 MAX ST PO) Take 1 tablet by mouth daily.   clonazePAM (KLONOPIN) 0.5 MG tablet Take 1 mg by mouth at bedtime.   ibuprofen (ADVIL) 200 MG tablet Take 800 mg by mouth every 6 (six) hours as needed for headache or moderate pain.   Multiple Vitamins-Minerals (HAIR/SKIN/NAILS PO) Take 3 tablets by mouth daily.  thyroid (ARMOUR) 60 MG tablet Take 60 mg by mouth daily before breakfast.    traMADol (ULTRAM) 50 MG tablet Take 50 mg by mouth 3 (three) times daily as needed.     Allergies:   Lisinopril   Social History   Socioeconomic History   Marital status: Widowed    Spouse name: Not on file   Number of children: Not on file   Years of education: Not on file   Highest education level: Not on file  Occupational History   Not on file  Tobacco Use   Smoking status: Never    Smokeless tobacco: Never  Vaping Use   Vaping Use: Never used  Substance and Sexual Activity   Alcohol use: Yes    Alcohol/week: 2.0 standard drinks    Types: 2 Glasses of wine per week    Comment: wine    Drug use: No   Sexual activity: Not on file  Other Topics Concern   Not on file  Social History Narrative   Not on file   Social Determinants of Health   Financial Resource Strain: Not on file  Food Insecurity: Not on file  Transportation Needs: Not on file  Physical Activity: Not on file  Stress: Not on file  Social Connections: Not on file     Family History: The patient's family history includes Heart disease in her brother, brother, and maternal grandfather; Stroke in her father.  ROS:   Please see the history of present illness.     All other systems reviewed and are negative.   Risk Assessment/Calculations:           Physical Exam:    VS:  BP 136/82   Pulse 86   Ht 5\' 3"  (1.6 m)   Wt 122 lb (55.3 kg)   BMI 21.61 kg/m     Wt Readings from Last 3 Encounters:  09/08/21 122 lb (55.3 kg)  07/23/21 125 lb (56.7 kg)  10/09/20 126 lb 9.6 oz (57.4 kg)     GEN:  Well nourished, well developed in no acute distress HEENT: Normal NECK: No JVD; No carotid bruits LYMPHATICS: No lymphadenopathy CARDIAC: RRR, no murmurs, rubs, gallops RESPIRATORY:  Clear to auscultation without rales, wheezing or rhonchi  ABDOMEN: Soft, non-tender, non-distended MUSCULOSKELETAL:  No edema; No deformity  SKIN: Warm and dry NEUROLOGIC:  Alert and oriented x 3 PSYCHIATRIC:  Normal affect    EKGs/Labs/Other Studies Reviewed:    The following studies were reviewed today:  EKG 07/01/2020  Normal sinus rhythm 80 bpm no ST or T wave deviation.  EKG:  EKG is  ordered today.  The ekg ordered today demonstrates normal sinus rhythm no ST or T wave deviation 86 bpm  Recent Labs: No results found for requested labs within last 8760 hours.  Recent Lipid Panel No results found  for: CHOL, TRIG, HDL, CHOLHDL, VLDL, LDLCALC, LDLDIRECT  ASSESSMENT & PLAN    Essential hypertension-BP today 136/82.  Well-controlled at home.  Previously did not tolerate 10 mg of amlodipine due to lower extremity swelling.  Blood pressure goal 130/80.  Recommendation for addition of spironolactone due to borderline low K if needed. Continue amlodipine Heart healthy low-sodium diet-salty 6 given Increase physical activity as tolerated  Syncope-no further episodes of lightheadedness, presyncope or syncope.  Prior episodes in the setting of increased environmental temperature.  She was noted to have lightheadedness/dizziness.  It was felt to be reflect syncope.  No sensation of palpitations with the events.  Preoperative cardiac evaluation-Dr. Frederik Pear, left total hip revision posterior and nail removal scheduled for 09/30/2021   Primary Cardiologist: Tiffany Dresser, MD  Chart reviewed as part of pre-operative protocol coverage. Given past medical history and time since last visit, based on ACC/AHA guidelines, Tiffany Fox would be at acceptable risk for the planned procedure without further cardiovascular testing.   Her RCRI is a class I risk, 0.4% risk of major cardiac event.    Patient was advised that if she develops new symptoms prior to surgery to contact our office to arrange a follow-up appointment.  He verbalized understanding.  I will route this recommendation to the requesting party via Epic fax function and remove from pre-op pool.  Please call with questions.  Disposition: Follow-up with Dr. Harrell Fox or me in 9-12 months.        Medication Adjustments/Labs and Tests Ordered: Current medicines are reviewed at length with the patient today.  Concerns regarding medicines are outlined above.  Orders Placed This Encounter  Procedures   EKG 12-Lead   No orders of the defined types were placed in this encounter.   Patient Instructions  Medication  Instructions:  Your Physician recommend you continue on your current medication as directed.    *If you need a refill on your cardiac medications before your next appointment, please call your pharmacy*   Lab Work: None ordered today   Testing/Procedures: You are cleared for your procedure!    Follow-Up: At [facility], you and your health needs are our priority.  As part of our continuing mission to provide you with exceptional heart care, we have created designated Provider Care Teams.  These Care Teams include your primary Cardiologist (physician) and Advanced Practice Providers (APPs -  Physician Assistants and Nurse Practitioners) who all work together to provide you with the care you need, when you need it.  We recommend signing up for the patient portal called "MyChart".  Sign up information is provided on this After Visit Summary.  MyChart is used to connect with patients for Virtual Visits (Telemedicine).  Patients are able to view lab/test results, encounter notes, upcoming appointments, etc.  Non-urgent messages can be sent to your provider as well.   To learn more about what you can do with MyChart, go to [email or web address].    Your next appointment:   9-12 month(s)  The format for your next appointment:   In Person  Provider:   Buford Dresser, MD    Other Instructions     Signed, Deberah Pelton, NP  09/08/2021 2:23 PM      Notice: This dictation was prepared with Dragon dictation along with smaller phrase technology. Any transcriptional errors that result from this process are unintentional and may not be corrected upon review.  I spent 13 minutes examining this patient, reviewing medications, and using patient centered shared decision making involving her cardiac care.  Prior to her visit I spent greater than 20 minutes reviewing her past medical history,  medications, and prior cardiac tests.

## 2021-09-08 ENCOUNTER — Other Ambulatory Visit: Payer: Self-pay

## 2021-09-08 ENCOUNTER — Encounter (HOSPITAL_BASED_OUTPATIENT_CLINIC_OR_DEPARTMENT_OTHER): Payer: Self-pay | Admitting: General Practice

## 2021-09-08 ENCOUNTER — Ambulatory Visit (INDEPENDENT_AMBULATORY_CARE_PROVIDER_SITE_OTHER): Payer: Medicare Other | Admitting: General Practice

## 2021-09-08 VITALS — BP 136/82 | HR 86 | Ht 63.0 in | Wt 122.0 lb

## 2021-09-08 DIAGNOSIS — Z87898 Personal history of other specified conditions: Secondary | ICD-10-CM | POA: Diagnosis not present

## 2021-09-08 DIAGNOSIS — Z01810 Encounter for preprocedural cardiovascular examination: Secondary | ICD-10-CM | POA: Diagnosis not present

## 2021-09-08 DIAGNOSIS — I10 Essential (primary) hypertension: Secondary | ICD-10-CM

## 2021-09-08 NOTE — Patient Instructions (Signed)
Medication Instructions:  Your Physician recommend you continue on your current medication as directed.    *If you need a refill on your cardiac medications before your next appointment, please call your pharmacy*   Lab Work: None ordered today   Testing/Procedures: You are cleared for your procedure!    Follow-Up: At [facility], you and your health needs are our priority.  As part of our continuing mission to provide you with exceptional heart care, we have created designated Provider Care Teams.  These Care Teams include your primary Cardiologist (physician) and Advanced Practice Providers (APPs -  Physician Assistants and Nurse Practitioners) who all work together to provide you with the care you need, when you need it.  We recommend signing up for the patient portal called "MyChart".  Sign up information is provided on this After Visit Summary.  MyChart is used to connect with patients for Virtual Visits (Telemedicine).  Patients are able to view lab/test results, encounter notes, upcoming appointments, etc.  Non-urgent messages can be sent to your provider as well.   To learn more about what you can do with MyChart, go to [email or web address].    Your next appointment:   9-12 month(s)  The format for your next appointment:   In Person  Provider:   Buford Dresser, MD    Other Instructions

## 2021-09-09 DIAGNOSIS — S42201D Unspecified fracture of upper end of right humerus, subsequent encounter for fracture with routine healing: Secondary | ICD-10-CM | POA: Diagnosis not present

## 2021-09-09 DIAGNOSIS — D2371 Other benign neoplasm of skin of right lower limb, including hip: Secondary | ICD-10-CM | POA: Diagnosis not present

## 2021-09-11 DIAGNOSIS — M1612 Unilateral primary osteoarthritis, left hip: Secondary | ICD-10-CM | POA: Diagnosis not present

## 2021-09-12 DIAGNOSIS — S42201D Unspecified fracture of upper end of right humerus, subsequent encounter for fracture with routine healing: Secondary | ICD-10-CM | POA: Diagnosis not present

## 2021-09-17 DIAGNOSIS — S42201D Unspecified fracture of upper end of right humerus, subsequent encounter for fracture with routine healing: Secondary | ICD-10-CM | POA: Diagnosis not present

## 2021-09-22 DIAGNOSIS — S42201D Unspecified fracture of upper end of right humerus, subsequent encounter for fracture with routine healing: Secondary | ICD-10-CM | POA: Diagnosis not present

## 2021-09-22 NOTE — Progress Notes (Signed)
DUE TO COVID-19 ONLY ONE VISITOR IS ALLOWED TO COME WITH YOU AND STAY IN THE WAITING ROOM ONLY DURING PRE OP AND PROCEDURE DAY OF SURGERY.  2 VISITOR  MAY VISIT WITH YOU AFTER SURGERY IN YOUR PRIVATE ROOM DURING VISITING HOURS ONLY!  YOU NEED TO HAVE A COVID 19 TEST ON__12/23/2022 _ @_from  8am-3pm _____, THIS TEST MUST BE DONE BEFORE SURGERY,  Covid test is done at [place], [place] [address].  This is a drive thru.  No appt required. Please see map.                 Your procedure is scheduled on:  09/30/2021   Report to [facility] Main  Entrance   Report to admitting at   1040AM     Call this number if you have problems the morning of surgery [phone]    REMEMBER: NO  SOLID FOOD CANDY OR GUM AFTER MIDNIGHT. CLEAR LIQUIDS UNTIL   1020am        . NOTHING BY MOUTH EXCEPT CLEAR LIQUIDS UNTIL 1020am    . PLEASE FINISH ENSURE DRINK PER SURGEON ORDER  WHICH NEEDS TO BE COMPLETED AT   1020am    .      CLEAR LIQUID DIET   Foods Allowed                                                                    Coffee and tea, regular and decaf                            Fruit ices (not with fruit pulp)                                      Iced Popsicles                                    Carbonated beverages, regular and diet                                    Cranberry, grape and apple juices Sports drinks like Gatorade Lightly seasoned clear broth or consume(fat free) Sugar, honey syrup ___________________________________________________________________      BRUSH YOUR TEETH MORNING OF SURGERY AND RINSE YOUR MOUTH OUT, NO CHEWING GUM CANDY OR MINTS.     Take these medicines the morning of surgery with A SIP OF WATER:  amlodipine, thyroid armour   DO NOT TAKE ANY DIABETIC MEDICATIONS DAY OF YOUR SURGERY                               You may not have any metal on your body including hair pins and              piercings  Do not wear jewelry, make-up, lotions,  powders or perfumes, deodorant             Do not  wear nail polish on your fingernails.  Do not shave  48 hours prior to surgery.              Men may shave face and neck.   Do not bring valuables to the hospital. [place].  Contacts, dentures or bridgework may not be worn into surgery.  Leave suitcase in the car. After surgery it may be brought to your room.     Patients discharged the day of surgery will not be allowed to drive home. IF YOU ARE HAVING SURGERY AND GOING HOME THE SAME DAY, YOU MUST HAVE AN ADULT TO DRIVE YOU HOME AND BE WITH YOU FOR 24 HOURS. YOU MAY GO HOME BY TAXI OR UBER OR ORTHERWISE, BUT AN ADULT MUST ACCOMPANY YOU HOME AND STAY WITH YOU FOR 24 HOURS.  Name and phone number of your driver:  Special Instructions: N/A              Please read over the following fact sheets you were given: _____________________________________________________________________  [facility] - Preparing for Surgery Before surgery, you can play an important role.  Because skin is not sterile, your skin needs to be as free of germs as possible.  You can reduce the number of germs on your skin by washing with CHG (chlorahexidine gluconate) soap before surgery.  CHG is an antiseptic cleaner which kills germs and bonds with the skin to continue killing germs even after washing. Please DO NOT use if you have an allergy to CHG or antibacterial soaps.  If your skin becomes reddened/irritated stop using the CHG and inform your nurse when you arrive at Short Stay. Do not shave (including legs and underarms) for at least 48 hours prior to the first CHG shower.  You may shave your face/neck. Please follow these instructions carefully:  1.  Shower with CHG Soap the night before surgery and the  morning of Surgery.  2.  If you choose to wash your hair, wash your hair first as usual with your  normal  shampoo.  3.  After you shampoo, rinse your hair and body  thoroughly to remove the  shampoo.                           4.  Use CHG as you would any other liquid soap.  You can apply chg directly  to the skin and wash                       Gently with a scrungie or clean washcloth.  5.  Apply the CHG Soap to your body ONLY FROM THE NECK DOWN.   Do not use on face/ open                           Wound or open sores. Avoid contact with eyes, ears mouth and genitals (private parts).                       Wash face,  Genitals (private parts) with your normal soap.             6.  Wash thoroughly, paying special attention to the area where your surgery  will be performed.  7.  Thoroughly rinse your body with warm water  from the neck down.  8.  DO NOT shower/wash with your normal soap after using and rinsing off  the CHG Soap.                9.  Pat yourself dry with a clean towel.            10.  Wear clean pajamas.            11.  Place clean sheets on your bed the night of your first shower and do not  sleep with pets. Day of Surgery : Do not apply any lotions/deodorants the morning of surgery.  Please wear clean clothes to the hospital/surgery center.  FAILURE TO FOLLOW THESE INSTRUCTIONS MAY RESULT IN THE CANCELLATION OF YOUR SURGERY PATIENT SIGNATURE_________________________________  NURSE SIGNATURE__________________________________  ________________________________________________________________________

## 2021-09-22 NOTE — Progress Notes (Addendum)
Anesthesia Review:  PCP:DR Lloyd Huger  LOVV 01/01/21  Cardiologist : 09/08/21 Coletta Memos, Np preop cardiac visit  Chest x-ray :07/23/21  EKG : 09/08/21  Echo : Stress test: Cardiac Cath :  Activity level: can do a flight of stairs without difficulty  Sleep Study/ CPAP : none  Fasting Blood Sugar :      / Checks Blood Sugar -- times a day:   Blood Thinner/ Instructions /Last Dose: ASA / Instructions/ Last Dose :   Covid tst on 09/26/21.  U/A routed to DR Mayer Camel on 09/23/21.   CBC done 09/23/21 routed to DR [facility].

## 2021-09-23 ENCOUNTER — Other Ambulatory Visit: Payer: Self-pay

## 2021-09-23 ENCOUNTER — Encounter (HOSPITAL_COMMUNITY)
Admission: RE | Admit: 2021-09-23 | Discharge: 2021-09-23 | Disposition: A | Discharge status: Home / Self Care | Payer: Medicare Other | Source: Ambulatory Visit | Attending: Orthopedic Surgery | Admitting: Orthopedic Surgery

## 2021-09-23 ENCOUNTER — Encounter (HOSPITAL_COMMUNITY): Payer: Self-pay

## 2021-09-23 VITALS — BP 145/87 | HR 87 | Temp 98.2°F | Resp 16 | Ht 63.0 in | Wt 119.0 lb

## 2021-09-23 DIAGNOSIS — M1612 Unilateral primary osteoarthritis, left hip: Secondary | ICD-10-CM | POA: Insufficient documentation

## 2021-09-23 DIAGNOSIS — I10 Essential (primary) hypertension: Secondary | ICD-10-CM | POA: Diagnosis not present

## 2021-09-23 DIAGNOSIS — Z01818 Encounter for other preprocedural examination: Secondary | ICD-10-CM

## 2021-09-23 DIAGNOSIS — Z96698 Presence of other orthopedic joint implants: Secondary | ICD-10-CM | POA: Diagnosis not present

## 2021-09-23 DIAGNOSIS — E039 Hypothyroidism, unspecified: Secondary | ICD-10-CM | POA: Insufficient documentation

## 2021-09-23 DIAGNOSIS — Z01812 Encounter for preprocedural laboratory examination: Secondary | ICD-10-CM | POA: Diagnosis not present

## 2021-09-23 LAB — CBC WITH DIFFERENTIAL/PLATELET
Abs Immature Granulocytes: 0.01 10*3/uL (ref 0.00–0.07)
Basophils Absolute: 0 10*3/uL (ref 0.0–0.1)
Basophils Relative: 1 %
Eosinophils Absolute: 0.1 10*3/uL (ref 0.0–0.5)
Eosinophils Relative: 2 %
HCT: 39.8 % (ref 36.0–46.0)
Hemoglobin: 13 g/dL (ref 12.0–15.0)
Immature Granulocytes: 0 %
Lymphocytes Relative: 34 %
Lymphs Abs: 1 10*3/uL (ref 0.7–4.0)
MCH: 33 pg (ref 26.0–34.0)
MCHC: 32.7 g/dL (ref 30.0–36.0)
MCV: 101 fL — ABNORMAL HIGH (ref 80.0–100.0)
Monocytes Absolute: 0.4 10*3/uL (ref 0.1–1.0)
Monocytes Relative: 14 %
Neutro Abs: 1.4 10*3/uL — ABNORMAL LOW (ref 1.7–7.7)
Neutrophils Relative %: 49 %
Platelets: 248 10*3/uL (ref 150–400)
RBC: 3.94 MIL/uL (ref 3.87–5.11)
RDW: 14.7 % (ref 11.5–15.5)
WBC: 2.9 10*3/uL — ABNORMAL LOW (ref 4.0–10.5)
nRBC: 0 % (ref 0.0–0.2)

## 2021-09-23 LAB — URINALYSIS, ROUTINE W REFLEX MICROSCOPIC
Bilirubin Urine: NEGATIVE
Glucose, UA: NEGATIVE mg/dL
Hgb urine dipstick: NEGATIVE
Ketones, ur: 5 mg/dL — AB
Leukocytes,Ua: NEGATIVE
Nitrite: NEGATIVE
Protein, ur: NEGATIVE mg/dL
Specific Gravity, Urine: 1.017 (ref 1.005–1.030)
pH: 6 (ref 5.0–8.0)

## 2021-09-23 LAB — PROTIME-INR
INR: 0.9 (ref 0.8–1.2)
Prothrombin Time: 12.6 seconds (ref 11.4–15.2)

## 2021-09-23 LAB — BASIC METABOLIC PANEL
Anion gap: 10 (ref 5–15)
BUN: 16 mg/dL (ref 8–23)
CO2: 24 mmol/L (ref 22–32)
Calcium: 9.3 mg/dL (ref 8.9–10.3)
Chloride: 102 mmol/L (ref 98–111)
Creatinine, Ser: 0.82 mg/dL (ref 0.44–1.00)
GFR, Estimated: >60 mL/min (ref >60)
Glucose, Bld: 98 mg/dL (ref 70–99)
Potassium: 4.1 mmol/L (ref 3.5–5.1)
Sodium: 136 mmol/L (ref 135–145)

## 2021-09-23 LAB — SURGICAL PCR SCREEN
MRSA, PCR: NEGATIVE
Staphylococcus aureus: NEGATIVE

## 2021-09-23 LAB — APTT: aPTT: 27 seconds (ref 24–36)

## 2021-09-24 DIAGNOSIS — S42201D Unspecified fracture of upper end of right humerus, subsequent encounter for fracture with routine healing: Secondary | ICD-10-CM | POA: Diagnosis not present

## 2021-09-24 DIAGNOSIS — M1612 Unilateral primary osteoarthritis, left hip: Secondary | ICD-10-CM | POA: Diagnosis present

## 2021-09-24 NOTE — Anesthesia Preprocedure Evaluation (Addendum)
Anesthesia Evaluation  Patient identified by MRN, date of birth, ID band Patient awake    Reviewed: Allergy & Precautions, NPO status , Patient's Chart, lab work & pertinent test results  Airway Mallampati: II  TM Distance: >3 FB Neck ROM: Full    Dental no notable dental hx. (+) Teeth Intact, Implants, Caps, Dental Advisory Given,    Pulmonary neg pulmonary ROS,    Pulmonary exam normal breath sounds clear to auscultation       Cardiovascular hypertension, Pt. on medications Normal cardiovascular exam Rhythm:Regular Rate:Normal     Neuro/Psych PSYCHIATRIC DISORDERS Anxiety Depression negative neurological ROS     GI/Hepatic negative GI ROS, Neg liver ROS,   Endo/Other  Hypothyroidism   Renal/GU negative Renal ROS  negative genitourinary   Musculoskeletal  (+) Arthritis ,   Abdominal   Peds negative pediatric ROS (+)  Hematology negative hematology ROS (+) Blood dyscrasia, anemia ,   Anesthesia Other Findings   Reproductive/Obstetrics negative OB ROS                           Anesthesia Physical Anesthesia Plan  ASA: 2  Anesthesia Plan: Spinal and MAC   Post-op Pain Management:    Induction:   PONV Risk Score and Plan: 2 and Ondansetron  Airway Management Planned: Natural Airway and Nasal Cannula  Additional Equipment: None  Intra-op Plan:   Post-operative Plan:   Informed Consent:   Plan Discussed with: Anesthesiologist and CRNA  Anesthesia Plan Comments: (See PAT note 09/23/2021, [name], PA-C  87 y.o. never smoker with h/o HTN, hypothyroidism, retained left femoral nail, left hip OA scheduled for above procedure 09/30/2021 with Dr. Frederik Pear.   Per cardiology preoperative evaluation 09/08/2021, "Chart reviewed as part of pre-operative protocol coverage. Given past medical history and time since last visit, based on ACC/AHA guidelines,Jennett G  Kleesewould be at acceptable risk for the planned procedure without further cardiovascular testing.   Her RCRI is a class I risk, 0.4% risk of major cardiac event." )     Anesthesia Quick Evaluation                                  Anesthesia Evaluation  Patient identified by MRN, date of birth, ID band Patient awake

## 2021-09-24 NOTE — H&P (Signed)
TOTAL HIP ADMISSION H&P  Patient is admitted for left total hip arthroplasty.  Subjective:  Chief Complaint: left hip pain  HPI: Tiffany Fox, 87 y.o. female, has a history of pain and functional disability in the left hip(s) due to arthritis and patient has failed non-surgical conservative treatments for greater than 12 weeks to include NSAID's and/or analgesics, flexibility and strengthening excercises, supervised PT with diminished ADL's post treatment, use of assistive devices, and activity modification.  Onset of symptoms was gradual starting  several  years ago with gradually worsening course since that time.The patient noted prior procedures of the hip to include trochanteric nail  on the left hip(s).  Patient currently rates pain in the left hip at 10 out of 10 with activity. Patient has night pain, worsening of pain with activity and weight bearing, trendelenberg gait, pain that interfers with activities of daily living, and pain with passive range of motion. Patient has evidence of well-placed left hip intertrochanteric nail with a lag screw, with compression screw.  by imaging studies. This condition presents safety issues increasing the risk of falls.  There is no current active infection.  Patient Active Problem List   Diagnosis Date Noted   Osteoarthritis of left hip 09/24/2021   Primary osteoarthritis of right knee 10/18/2018   Leukocytopenia 12/18/2014   Other pancytopenia ([place]) 12/18/2014   Past Medical History:  Diagnosis Date   Anxiety    Arthritis    Cervical spondylosis    Depression    Hypertension    Hypothyroidism    Leukocytopenia    Osteoporosis     Past Surgical History:  Procedure Laterality Date   APPENDECTOMY  2012   EYE SURGERY Bilateral 2012   FRACTURE SURGERY     HIP FRACTURE SURGERY Left 05.2011   JOINT REPLACEMENT     TOTAL KNEE ARTHROPLASTY Right 10/18/2018   TOTAL KNEE ARTHROPLASTY Right 10/18/2018   Procedure: TOTAL KNEE ARTHROPLASTY;   Surgeon: Melrose Nakayama, MD;  Location: [place];  Service: Orthopedics;  Laterality: Right;    No current facility-administered medications for this encounter.   Current Outpatient Medications  Medication Sig Dispense Refill Last Dose   amLODipine (NORVASC) 5 MG tablet Take 5 mg by mouth daily.      Calcium-Phosphorus-Vitamin D (CITRACAL +D3 PO) Take 2 tablets by mouth daily.      Camphor-Menthol-Methyl Sal (SALONPAS) 3.10-10-08 % PTCH Apply 1 patch topically daily as needed (pain). With Lidocaine      clonazePAM (KLONOPIN) 0.5 MG tablet Take 1 mg by mouth at bedtime.      ibuprofen (ADVIL) 200 MG tablet Take 400-800 mg by mouth every 6 (six) hours as needed for headache or moderate pain.      Multiple Vitamins-Minerals (HAIR/SKIN/NAILS/BIOTIN PO) Take 3 each by mouth daily.      thyroid (ARMOUR) 60 MG tablet Take 60 mg by mouth daily before breakfast.       oxyCODONE (ROXICODONE) 5 MG immediate release tablet Take 1 tablet (5 mg total) by mouth every 4 (four) hours as needed for severe pain. (Patient not taking: Reported on 09/08/2021) 8 tablet 0 Not Taking   traMADol (ULTRAM) 50 MG tablet Take 50 mg by mouth 3 (three) times daily as needed.      Allergies  Allergen Reactions   Lisinopril Rash    Social History   Tobacco Use   Smoking status: Never   Smokeless tobacco: Never  Substance Use Topics   Alcohol use: Yes    Alcohol/week:  2.0 standard drinks    Types: 2 Glasses of wine per week    Comment: wine - daily    Family History  Problem Relation Age of Onset   Stroke Father    Heart disease Brother    Heart disease Maternal Grandfather    Heart disease Brother      Review of Systems  Constitutional:  Positive for diaphoresis.  HENT: Negative.    Eyes: Negative.   Respiratory: Negative.    Cardiovascular: Negative.   Gastrointestinal: Negative.   Endocrine: Positive for polydipsia.  Genitourinary:  Positive for difficulty urinating and urgency.  Musculoskeletal:   Positive for arthralgias and myalgias.  Skin:  Positive for rash.  Allergic/Immunologic: Negative.   Neurological: Negative.   Hematological:  Bruises/bleeds easily.  Psychiatric/Behavioral:  Positive for sleep disturbance. The patient is nervous/anxious.    Objective:  Physical Exam Constitutional:      Appearance: Normal appearance. She is normal weight.  HENT:     Head: Normocephalic and atraumatic.     Nose: Nose normal.     Mouth/Throat:     Mouth: Mucous membranes are dry.     Pharynx: Oropharynx is clear.  Eyes:     Pupils: Pupils are equal, round, and reactive to light.  Cardiovascular:     Pulses: Normal pulses.  Pulmonary:     Effort: Pulmonary effort is normal.  Musculoskeletal:     Cervical back: Normal range of motion and neck supple.     Comments: Her physical exam is not changed internal rotation of the left hip causes discomfort in the groin area surgical scars from the nail insertion that was done over 10 years ago are benign.  She is neurovascular intact distally.  Skin:    General: Skin is warm and dry.  Neurological:     General: No focal deficit present.     Mental Status: She is alert and oriented to person, place, and time. Mental status is at baseline.  Psychiatric:        Mood and Affect: Mood normal.        Behavior: Behavior normal.        Thought Content: Thought content normal.        Judgment: Judgment normal.    Vital signs in last 24 hours: Temp:  [98.2 F (36.8 C)] 98.2 F (36.8 C) (12/20 1441) Pulse Rate:  [87] 87 (12/20 1441) Resp:  [16] 16 (12/20 1441) BP: (145)/(87) 145/87 (12/20 1441) SpO2:  [99 %] 99 % (12/20 1441) Weight:  [54 kg] 54 kg (12/20 1441)  Labs:   Estimated body mass index is 21.08 kg/m as calculated from the following:   Height as of 09/23/21: 5\' 3"  (1.6 m).   Weight as of 09/23/21: 54 kg.   Imaging Review Plain radiographs demonstrate  good placement of the troch nail proximally and advanced degenerative  change at the left hip joint.   Assessment/Plan:  End stage arthritis, left hip(s)  The patient history, physical examination, clinical judgement of the provider and imaging studies are consistent with end stage degenerative joint disease of the left hip(s) and total hip arthroplasty is deemed medically necessary. The treatment options including medical management, injection therapy, arthroscopy and arthroplasty were discussed at length. The risks and benefits of total hip arthroplasty were presented and reviewed. The risks due to aseptic loosening, infection, stiffness, dislocation/subluxation,  thromboembolic complications and other imponderables were discussed.  The patient acknowledged the explanation, agreed to proceed with the plan and consent  was signed. Patient is being admitted for inpatient treatment for surgery, pain control, PT, OT, prophylactic antibiotics, VTE prophylaxis, progressive ambulation and ADL's and discharge planning.The patient is planning to be discharged home with home health services   Anticipated LOS equal to or greater than 2 midnights due to - Age 71 and older with one or more of the following:  - Obesity  - Expected need for hospital services (PT, OT, Nursing) required for safe  discharge  - Anticipated need for postoperative skilled nursing care or inpatient rehab

## 2021-09-24 NOTE — Progress Notes (Signed)
Anesthesia Chart Review   Case: [id number] Date/Time: 09/30/21 1309   Procedure: LEFT TOTAL HIP REVISION POSTERIOR AND NAIL REMOVAL (Left: Hip)   Anesthesia type: Spinal   Pre-op diagnosis: RETAINED LEFT FEMORAL NAIL AND LEFT HIP OSTEOARTHRITS   Location: [facility]   Surgeons: Frederik Pear, MD       DISCUSSION:87 y.o. never smoker with h/o HTN, hypothyroidism, retained left femoral nail, left hip OA scheduled for above procedure 09/30/2021 with Dr. Frederik Pear.   Per cardiology preoperative evaluation 09/08/2021, "Chart reviewed as part of pre-operative protocol coverage. Given past medical history and time since last visit, based on ACC/AHA guidelines, Tiffany Fox would be at acceptable risk for the planned procedure without further cardiovascular testing.    Her RCRI is a class I risk, 0.4% risk of major cardiac event."  Anticipate pt can proceed with planned procedure barring acute status change.   VS: BP (!) 145/87    Pulse 87    Temp 36.8 C (Oral)    Resp 16    Ht 5\' 3"  (1.6 m)    Wt 54 kg    SpO2 99%    BMI 21.08 kg/m   PROVIDERS: Rankins, Bill Salinas, MD is PCP   Cardiologist:  Buford Dresser, MD  LABS: Labs reviewed: Acceptable for surgery. (all labs ordered are listed, but only abnormal results are displayed)  Labs Reviewed  CBC WITH DIFFERENTIAL/PLATELET - Abnormal; Notable for the following components:      Result Value   WBC 2.9 (*)    MCV 101.0 (*)    Neutro Abs 1.4 (*)    All other components within normal limits  URINALYSIS, ROUTINE W REFLEX MICROSCOPIC - Abnormal; Notable for the following components:   Ketones, ur 5 (*)    All other components within normal limits  SURGICAL PCR SCREEN  BASIC METABOLIC PANEL  PROTIME-INR  APTT  TYPE AND SCREEN     IMAGES:   EKG: 09/08/2021 Rate 86 bpm  NSR  CV:  Past Medical History:  Diagnosis Date   Anxiety    Arthritis    Cervical spondylosis    Depression    Hypertension     Hypothyroidism    Leukocytopenia    Osteoporosis     Past Surgical History:  Procedure Laterality Date   APPENDECTOMY  2012   EYE SURGERY Bilateral 2012   FRACTURE SURGERY     HIP FRACTURE SURGERY Left 05.2011   JOINT REPLACEMENT     TOTAL KNEE ARTHROPLASTY Right 10/18/2018   TOTAL KNEE ARTHROPLASTY Right 10/18/2018   Procedure: TOTAL KNEE ARTHROPLASTY;  Surgeon: Melrose Nakayama, MD;  Location: [place];  Service: Orthopedics;  Laterality: Right;    MEDICATIONS:  amLODipine (NORVASC) 5 MG tablet   Calcium-Phosphorus-Vitamin D (CITRACAL +D3 PO)   Camphor-Menthol-Methyl Sal (SALONPAS) 3.10-10-08 % PTCH   clonazePAM (KLONOPIN) 0.5 MG tablet   ibuprofen (ADVIL) 200 MG tablet   Multiple Vitamins-Minerals (HAIR/SKIN/NAILS/BIOTIN PO)   oxyCODONE (ROXICODONE) 5 MG immediate release tablet   thyroid (ARMOUR) 60 MG tablet   traMADol (ULTRAM) 50 MG tablet   No current facility-administered medications for this encounter.    [name], PA-C [facility] [phone]

## 2021-09-26 ENCOUNTER — Other Ambulatory Visit: Payer: Self-pay

## 2021-09-30 ENCOUNTER — Encounter (HOSPITAL_COMMUNITY)
Admission: RE | Disposition: A | Discharge status: Home Health | Payer: Self-pay | Source: Ambulatory Visit | Attending: Orthopedic Surgery

## 2021-09-30 ENCOUNTER — Inpatient Hospital Stay (HOSPITAL_COMMUNITY): Payer: Medicare Other

## 2021-09-30 ENCOUNTER — Encounter (HOSPITAL_COMMUNITY): Payer: Self-pay | Admitting: Orthopedic Surgery

## 2021-09-30 ENCOUNTER — Inpatient Hospital Stay (HOSPITAL_COMMUNITY): Payer: Medicare Other | Admitting: Physician Assistant

## 2021-09-30 ENCOUNTER — Inpatient Hospital Stay (HOSPITAL_COMMUNITY)
Admission: RE | Admit: 2021-09-30 | Discharge: 2021-10-03 | DRG: 470 | Disposition: A | Discharge status: Home Health | Payer: Medicare Other | Source: Ambulatory Visit | Attending: Orthopedic Surgery | Admitting: Orthopedic Surgery

## 2021-09-30 ENCOUNTER — Other Ambulatory Visit: Payer: Self-pay

## 2021-09-30 ENCOUNTER — Inpatient Hospital Stay (HOSPITAL_COMMUNITY): Payer: Medicare Other | Admitting: Anesthesiology

## 2021-09-30 DIAGNOSIS — Z01818 Encounter for other preprocedural examination: Secondary | ICD-10-CM

## 2021-09-30 DIAGNOSIS — Z96642 Presence of left artificial hip joint: Secondary | ICD-10-CM | POA: Diagnosis not present

## 2021-09-30 DIAGNOSIS — M1612 Unilateral primary osteoarthritis, left hip: Secondary | ICD-10-CM | POA: Diagnosis not present

## 2021-09-30 DIAGNOSIS — T84195A Other mechanical complication of internal fixation device of left femur, initial encounter: Secondary | ICD-10-CM | POA: Diagnosis not present

## 2021-09-30 DIAGNOSIS — Z8249 Family history of ischemic heart disease and other diseases of the circulatory system: Secondary | ICD-10-CM

## 2021-09-30 DIAGNOSIS — E039 Hypothyroidism, unspecified: Secondary | ICD-10-CM | POA: Diagnosis present

## 2021-09-30 DIAGNOSIS — M810 Age-related osteoporosis without current pathological fracture: Secondary | ICD-10-CM | POA: Diagnosis not present

## 2021-09-30 DIAGNOSIS — Z20822 Contact with and (suspected) exposure to covid-19: Secondary | ICD-10-CM | POA: Diagnosis not present

## 2021-09-30 DIAGNOSIS — Z823 Family history of stroke: Secondary | ICD-10-CM | POA: Diagnosis not present

## 2021-09-30 DIAGNOSIS — M1711 Unilateral primary osteoarthritis, right knee: Secondary | ICD-10-CM

## 2021-09-30 DIAGNOSIS — Z9889 Other specified postprocedural states: Secondary | ICD-10-CM | POA: Diagnosis not present

## 2021-09-30 DIAGNOSIS — Z96651 Presence of right artificial knee joint: Secondary | ICD-10-CM | POA: Diagnosis present

## 2021-09-30 DIAGNOSIS — Z471 Aftercare following joint replacement surgery: Secondary | ICD-10-CM | POA: Diagnosis not present

## 2021-09-30 DIAGNOSIS — D62 Acute posthemorrhagic anemia: Secondary | ICD-10-CM | POA: Diagnosis not present

## 2021-09-30 DIAGNOSIS — I10 Essential (primary) hypertension: Secondary | ICD-10-CM | POA: Diagnosis not present

## 2021-09-30 HISTORY — PX: TOTAL HIP REVISION: SHX763

## 2021-09-30 LAB — SARS CORONAVIRUS 2 BY RT PCR (HOSPITAL ORDER, PERFORMED IN ~~LOC~~ HOSPITAL LAB): SARS Coronavirus 2: NEGATIVE

## 2021-09-30 LAB — TYPE AND SCREEN
ABO/RH(D): A POS
Antibody Screen: NEGATIVE

## 2021-09-30 LAB — SARS CORONAVIRUS 2 (TAT 6-24 HRS): SARS Coronavirus 2: NEGATIVE

## 2021-09-30 SURGERY — TOTAL HIP REVISION
Anesthesia: Monitor Anesthesia Care | Site: Hip | Laterality: Left

## 2021-09-30 MED ORDER — PANTOPRAZOLE SODIUM 40 MG PO TBEC
40.0000 mg | DELAYED_RELEASE_TABLET | Freq: Every day | ORAL | Status: DC
  Administered 2021-09-30 – 2021-10-03 (×3): 40 mg via ORAL
  Filled 2021-09-30 (×3): qty 1

## 2021-09-30 MED ORDER — LIP MEDEX EX OINT
TOPICAL_OINTMENT | CUTANEOUS | Status: AC
  Filled 2021-09-30: qty 7

## 2021-09-30 MED ORDER — POLYETHYLENE GLYCOL 3350 17 G PO PACK: 17.0000 g | PACK | Freq: Every day | ORAL | Status: DC | PRN

## 2021-09-30 MED ORDER — ONDANSETRON HCL 4 MG/2ML IJ SOLN
INTRAMUSCULAR | Status: DC | PRN
  Administered 2021-09-30: 4 mg via INTRAVENOUS

## 2021-09-30 MED ORDER — STERILE WATER FOR IRRIGATION IR SOLN
Status: DC | PRN
  Administered 2021-09-30: 2000 mL

## 2021-09-30 MED ORDER — BISACODYL 5 MG PO TBEC: 5.0000 mg | DELAYED_RELEASE_TABLET | Freq: Every day | ORAL | Status: DC | PRN

## 2021-09-30 MED ORDER — BUPIVACAINE HCL (PF) 0.5 % IJ SOLN
INTRAMUSCULAR | Status: DC | PRN
  Administered 2021-09-30: 2.5 mL via INTRATHECAL

## 2021-09-30 MED ORDER — OXYCODONE HCL 5 MG PO TABS
10.0000 mg | ORAL_TABLET | ORAL | Status: DC | PRN
Start: 2021-09-30 — End: 2021-10-03
  Administered 2021-10-01: 15 mg via ORAL
  Administered 2021-10-01: 18:00:00 10 mg via ORAL
  Administered 2021-10-01 (×2): 15 mg via ORAL
  Filled 2021-09-30: qty 3
  Filled 2021-09-30: qty 2
  Filled 2021-09-30 (×2): qty 3

## 2021-09-30 MED ORDER — THYROID 60 MG PO TABS
60.0000 mg | ORAL_TABLET | Freq: Every day | ORAL | Status: DC
  Administered 2021-10-01 – 2021-10-03 (×3): 60 mg via ORAL
  Filled 2021-09-30 (×3): qty 1

## 2021-09-30 MED ORDER — SODIUM CHLORIDE 0.9% FLUSH
INTRAVENOUS | Status: DC | PRN
  Administered 2021-09-30: 20 mL

## 2021-09-30 MED ORDER — METOCLOPRAMIDE HCL 5 MG PO TABS: 5.0000 mg | ORAL_TABLET | Freq: Three times a day (TID) | ORAL | Status: DC | PRN

## 2021-09-30 MED ORDER — LIDOCAINE HCL (CARDIAC) PF 100 MG/5ML IV SOSY
PREFILLED_SYRINGE | INTRAVENOUS | Status: DC | PRN
  Administered 2021-09-30: 40 mg via INTRAVENOUS

## 2021-09-30 MED ORDER — DEXMEDETOMIDINE (PRECEDEX) IN NS 20 MCG/5ML (4 MCG/ML) IV SYRINGE
PREFILLED_SYRINGE | INTRAVENOUS | Status: DC | PRN
  Administered 2021-09-30 (×2): 4 ug via INTRAVENOUS

## 2021-09-30 MED ORDER — CLONAZEPAM 1 MG PO TABS
1.0000 mg | ORAL_TABLET | Freq: Every day | ORAL | Status: DC
  Administered 2021-09-30 – 2021-10-02 (×3): 1 mg via ORAL
  Filled 2021-09-30 (×3): qty 1

## 2021-09-30 MED ORDER — OXYCODONE-ACETAMINOPHEN 5-325 MG PO TABS: 1.0000 | ORAL_TABLET | ORAL | 0 refills | Status: DC | PRN

## 2021-09-30 MED ORDER — ONDANSETRON HCL 4 MG PO TABS: 4.0000 mg | ORAL_TABLET | Freq: Four times a day (QID) | ORAL | Status: DC | PRN

## 2021-09-30 MED ORDER — FENTANYL CITRATE (PF) 100 MCG/2ML IJ SOLN
INTRAMUSCULAR | Status: AC
  Filled 2021-09-30: qty 2

## 2021-09-30 MED ORDER — DOCUSATE SODIUM 100 MG PO CAPS
100.0000 mg | ORAL_CAPSULE | Freq: Two times a day (BID) | ORAL | Status: DC
  Administered 2021-09-30 – 2021-10-03 (×5): 100 mg via ORAL
  Filled 2021-09-30 (×5): qty 1

## 2021-09-30 MED ORDER — SODIUM CHLORIDE (PF) 0.9 % IJ SOLN
INTRAMUSCULAR | Status: AC
  Filled 2021-09-30: qty 30

## 2021-09-30 MED ORDER — DEXAMETHASONE SODIUM PHOSPHATE 10 MG/ML IJ SOLN
10.0000 mg | Freq: Once | INTRAMUSCULAR | Status: AC
  Administered 2021-10-01: 10:00:00 10 mg via INTRAVENOUS
  Filled 2021-09-30: qty 1

## 2021-09-30 MED ORDER — DEXAMETHASONE SODIUM PHOSPHATE 10 MG/ML IJ SOLN
INTRAMUSCULAR | Status: DC | PRN
  Administered 2021-09-30: 8 mg via INTRAVENOUS

## 2021-09-30 MED ORDER — ACETAMINOPHEN 325 MG PO TABS: 325.0000 mg | ORAL_TABLET | ORAL | Status: DC | PRN

## 2021-09-30 MED ORDER — TRANEXAMIC ACID 1000 MG/10ML IV SOLN
INTRAVENOUS | Status: DC | PRN
  Administered 2021-09-30: 16:00:00 2000 mg via TOPICAL

## 2021-09-30 MED ORDER — KCL IN DEXTROSE-NACL 20-5-0.45 MEQ/L-%-% IV SOLN
INTRAVENOUS | Status: DC
  Filled 2021-09-30 (×3): qty 1000

## 2021-09-30 MED ORDER — ONDANSETRON HCL 4 MG/2ML IJ SOLN: 4.0000 mg | Freq: Four times a day (QID) | INTRAMUSCULAR | Status: DC | PRN

## 2021-09-30 MED ORDER — FENTANYL CITRATE (PF) 100 MCG/2ML IJ SOLN
INTRAMUSCULAR | Status: DC | PRN
  Administered 2021-09-30: 25 ug via INTRAVENOUS
  Administered 2021-09-30: 10 ug via INTRAVENOUS
  Administered 2021-09-30 (×2): 25 ug via INTRAVENOUS
  Administered 2021-09-30: 50 ug via INTRAVENOUS
  Administered 2021-09-30: 15 ug via INTRAVENOUS
  Administered 2021-09-30 (×2): 25 ug via INTRAVENOUS

## 2021-09-30 MED ORDER — TRANEXAMIC ACID-NACL 1000-0.7 MG/100ML-% IV SOLN
1000.0000 mg | Freq: Once | INTRAVENOUS | Status: AC
  Administered 2021-09-30: 20:00:00 1000 mg via INTRAVENOUS
  Filled 2021-09-30: qty 100

## 2021-09-30 MED ORDER — ASPIRIN EC 81 MG PO TBEC: 81.0000 mg | DELAYED_RELEASE_TABLET | Freq: Two times a day (BID) | ORAL | 0 refills | Status: DC

## 2021-09-30 MED ORDER — BUPIVACAINE LIPOSOME 1.3 % IJ SUSP: 10.0000 mL | Freq: Once | INTRAMUSCULAR | Status: DC

## 2021-09-30 MED ORDER — OXYCODONE HCL 5 MG PO TABS: 5.0000 mg | ORAL_TABLET | Freq: Once | ORAL | Status: DC | PRN

## 2021-09-30 MED ORDER — CEFAZOLIN SODIUM-DEXTROSE 2-4 GM/100ML-% IV SOLN
INTRAVENOUS | Status: AC
  Filled 2021-09-30: qty 100

## 2021-09-30 MED ORDER — LACTATED RINGERS IV SOLN: INTRAVENOUS | Status: DC

## 2021-09-30 MED ORDER — METOCLOPRAMIDE HCL 5 MG/ML IJ SOLN
5.0000 mg | Freq: Three times a day (TID) | INTRAMUSCULAR | Status: DC | PRN
Start: 2021-09-30 — End: 2021-10-03

## 2021-09-30 MED ORDER — METHOCARBAMOL 500 MG PO TABS
500.0000 mg | ORAL_TABLET | Freq: Four times a day (QID) | ORAL | Status: DC | PRN
  Administered 2021-10-01 – 2021-10-03 (×4): 500 mg via ORAL
  Filled 2021-09-30 (×4): qty 1

## 2021-09-30 MED ORDER — BUPIVACAINE LIPOSOME 1.3 % IJ SUSP
INTRAMUSCULAR | Status: DC | PRN
  Administered 2021-09-30: 20 mL

## 2021-09-30 MED ORDER — FLEET ENEMA 7-19 GM/118ML RE ENEM: 1.0000 | ENEMA | Freq: Once | RECTAL | Status: DC | PRN

## 2021-09-30 MED ORDER — MIDAZOLAM HCL 5 MG/5ML IJ SOLN
INTRAMUSCULAR | Status: DC | PRN
  Administered 2021-09-30: .5 mg via INTRAVENOUS

## 2021-09-30 MED ORDER — 0.9 % SODIUM CHLORIDE (POUR BTL) OPTIME
TOPICAL | Status: DC | PRN
  Administered 2021-09-30: 14:00:00 1000 mL

## 2021-09-30 MED ORDER — FENTANYL CITRATE PF 50 MCG/ML IJ SOSY: 25.0000 ug | PREFILLED_SYRINGE | INTRAMUSCULAR | Status: DC | PRN

## 2021-09-30 MED ORDER — HYDROMORPHONE HCL 1 MG/ML IJ SOLN
0.5000 mg | INTRAMUSCULAR | Status: DC | PRN
  Administered 2021-09-30: 21:00:00 1 mg via INTRAVENOUS
  Administered 2021-10-03: 02:00:00 0.5 mg via INTRAVENOUS
  Filled 2021-09-30 (×2): qty 1

## 2021-09-30 MED ORDER — ACETAMINOPHEN 325 MG PO TABS
325.0000 mg | ORAL_TABLET | Freq: Four times a day (QID) | ORAL | Status: DC | PRN
  Administered 2021-10-01 – 2021-10-02 (×4): 650 mg via ORAL
  Filled 2021-09-30 (×2): qty 2
  Filled 2021-09-30: qty 1
  Filled 2021-09-30 (×2): qty 2

## 2021-09-30 MED ORDER — MEPERIDINE HCL 50 MG/ML IJ SOLN: 6.2500 mg | INTRAMUSCULAR | Status: DC | PRN

## 2021-09-30 MED ORDER — OXYCODONE HCL 5 MG/5ML PO SOLN: 5.0000 mg | Freq: Once | ORAL | Status: DC | PRN

## 2021-09-30 MED ORDER — PHENYLEPHRINE HCL-NACL 20-0.9 MG/250ML-% IV SOLN
INTRAVENOUS | Status: DC | PRN
  Administered 2021-09-30: 40 ug/min via INTRAVENOUS

## 2021-09-30 MED ORDER — DEXAMETHASONE SODIUM PHOSPHATE 10 MG/ML IJ SOLN
INTRAMUSCULAR | Status: AC
  Filled 2021-09-30: qty 1

## 2021-09-30 MED ORDER — BUPIVACAINE LIPOSOME 1.3 % IJ SUSP
INTRAMUSCULAR | Status: AC
  Filled 2021-09-30: qty 20

## 2021-09-30 MED ORDER — DIPHENHYDRAMINE HCL 12.5 MG/5ML PO ELIX
12.5000 mg | ORAL_SOLUTION | ORAL | Status: DC | PRN
  Administered 2021-10-02: 23:00:00 12.5 mg via ORAL
  Filled 2021-09-30: qty 5

## 2021-09-30 MED ORDER — PROPOFOL 1000 MG/100ML IV EMUL
INTRAVENOUS | Status: AC
  Filled 2021-09-30: qty 100

## 2021-09-30 MED ORDER — ONDANSETRON HCL 4 MG/2ML IJ SOLN: 4.0000 mg | Freq: Once | INTRAMUSCULAR | Status: DC | PRN

## 2021-09-30 MED ORDER — LIDOCAINE HCL (PF) 2 % IJ SOLN
INTRAMUSCULAR | Status: AC
  Filled 2021-09-30: qty 5

## 2021-09-30 MED ORDER — MIDAZOLAM HCL 2 MG/2ML IJ SOLN
INTRAMUSCULAR | Status: AC
  Filled 2021-09-30: qty 2

## 2021-09-30 MED ORDER — MENTHOL 3 MG MT LOZG
1.0000 | LOZENGE | OROMUCOSAL | Status: DC | PRN
  Administered 2021-10-01: 3 mg via ORAL
  Filled 2021-09-30: qty 9

## 2021-09-30 MED ORDER — TRANEXAMIC ACID-NACL 1000-0.7 MG/100ML-% IV SOLN
1000.0000 mg | INTRAVENOUS | Status: AC
  Administered 2021-09-30: 14:00:00 1000 mg via INTRAVENOUS
  Filled 2021-09-30: qty 100

## 2021-09-30 MED ORDER — BUPIVACAINE LIPOSOME 1.3 % IJ SUSP
INTRAMUSCULAR | Status: AC
  Filled 2021-09-30: qty 10

## 2021-09-30 MED ORDER — AMLODIPINE BESYLATE 5 MG PO TABS
5.0000 mg | ORAL_TABLET | Freq: Every day | ORAL | Status: DC
  Administered 2021-09-30 – 2021-10-02 (×3): 5 mg via ORAL
  Filled 2021-09-30 (×4): qty 1

## 2021-09-30 MED ORDER — METHOCARBAMOL 500 MG IVPB - SIMPLE MED
500.0000 mg | Freq: Four times a day (QID) | INTRAVENOUS | Status: DC | PRN
  Filled 2021-09-30 (×2): qty 50

## 2021-09-30 MED ORDER — ASPIRIN 81 MG PO CHEW
81.0000 mg | CHEWABLE_TABLET | Freq: Two times a day (BID) | ORAL | Status: DC
  Administered 2021-09-30 – 2021-10-03 (×6): 81 mg via ORAL
  Filled 2021-09-30 (×6): qty 1

## 2021-09-30 MED ORDER — TIZANIDINE HCL 2 MG PO TABS: 2.0000 mg | ORAL_TABLET | Freq: Four times a day (QID) | ORAL | 0 refills | Status: DC | PRN

## 2021-09-30 MED ORDER — ACETAMINOPHEN 160 MG/5ML PO SOLN: 325.0000 mg | ORAL | Status: DC | PRN

## 2021-09-30 MED ORDER — CEFAZOLIN SODIUM-DEXTROSE 2-4 GM/100ML-% IV SOLN
2.0000 g | INTRAVENOUS | Status: AC
  Administered 2021-09-30 (×2): 2 g via INTRAVENOUS
  Filled 2021-09-30: qty 100

## 2021-09-30 MED ORDER — PHENYLEPHRINE HCL-NACL 20-0.9 MG/250ML-% IV SOLN
INTRAVENOUS | Status: AC
  Filled 2021-09-30: qty 250

## 2021-09-30 MED ORDER — PROPOFOL 500 MG/50ML IV EMUL
INTRAVENOUS | Status: DC | PRN
  Administered 2021-09-30: 50 ug/kg/min via INTRAVENOUS

## 2021-09-30 MED ORDER — BUPIVACAINE-EPINEPHRINE (PF) 0.25% -1:200000 IJ SOLN
INTRAMUSCULAR | Status: AC
  Filled 2021-09-30: qty 30

## 2021-09-30 MED ORDER — PHENYLEPHRINE HCL (PRESSORS) 10 MG/ML IV SOLN
INTRAVENOUS | Status: AC
  Filled 2021-09-30: qty 1

## 2021-09-30 MED ORDER — POVIDONE-IODINE 10 % EX SWAB
2.0000 "application " | Freq: Once | CUTANEOUS | Status: AC
  Administered 2021-09-30: 2 via TOPICAL

## 2021-09-30 MED ORDER — TRANEXAMIC ACID 1000 MG/10ML IV SOLN
2000.0000 mg | INTRAVENOUS | Status: DC
  Filled 2021-09-30: qty 20

## 2021-09-30 MED ORDER — PHENOL 1.4 % MT LIQD: 1.0000 | OROMUCOSAL | Status: DC | PRN

## 2021-09-30 MED ORDER — OXYCODONE HCL 5 MG PO TABS
5.0000 mg | ORAL_TABLET | ORAL | Status: DC | PRN
  Administered 2021-09-30 – 2021-10-02 (×4): 10 mg via ORAL
  Administered 2021-10-02 – 2021-10-03 (×4): 5 mg via ORAL
  Filled 2021-09-30 (×2): qty 1
  Filled 2021-09-30 (×5): qty 2
  Filled 2021-09-30: qty 1

## 2021-09-30 SURGICAL SUPPLY — 65 items
APL PRP STRL LF DISP 70% ISPRP (MISCELLANEOUS) ×1
BAG COUNTER SPONGE SURGICOUNT (BAG) IMPLANT
BAG DECANTER FOR FLEXI CONT (MISCELLANEOUS) ×2 IMPLANT
BAG SPNG CNTER NS LX DISP (BAG)
BLADE SAW SAG 73X25 THK (BLADE) ×2
BLADE SAW SGTL 73X25 THK (BLADE) IMPLANT
CHLORAPREP W/TINT 26 (MISCELLANEOUS) ×2 IMPLANT
COVER SURGICAL LIGHT HANDLE (MISCELLANEOUS) ×2 IMPLANT
DECANTER SPIKE VIAL GLASS SM (MISCELLANEOUS) ×4 IMPLANT
DRAPE C-ARM 42X120 X-RAY (DRAPES) IMPLANT
DRAPE C-ARMOR (DRAPES) IMPLANT
DRAPE ORTHO SPLIT 77X108 STRL (DRAPES) ×4
DRAPE SHEET LG 3/4 BI-LAMINATE (DRAPES) ×2 IMPLANT
DRAPE SURG ORHT 6 SPLT 77X108 (DRAPES) ×2 IMPLANT
DRAPE U-SHAPE 47X51 STRL (DRAPES) ×2 IMPLANT
DRSG AQUACEL AG ADV 3.5X10 (GAUZE/BANDAGES/DRESSINGS) ×2 IMPLANT
ELECT BLADE TIP CTD 4 INCH (ELECTRODE) ×2 IMPLANT
ELECT REM PT RETURN 15FT ADLT (MISCELLANEOUS) ×2 IMPLANT
GAUZE SPONGE 4X4 12PLY STRL (GAUZE/BANDAGES/DRESSINGS) IMPLANT
GAUZE XEROFORM 5X9 LF (GAUZE/BANDAGES/DRESSINGS) IMPLANT
GLOVE SRG 8 PF TXTR STRL LF DI (GLOVE) ×1 IMPLANT
GLOVE SURG ENC MOIS LTX SZ7.5 (GLOVE) ×2 IMPLANT
GLOVE SURG ENC MOIS LTX SZ8.5 (GLOVE) ×2 IMPLANT
GLOVE SURG UNDER POLY LF SZ8 (GLOVE) ×2
GLOVE SURG UNDER POLY LF SZ9 (GLOVE) ×2 IMPLANT
GOWN STRL REIN XL XLG (GOWN DISPOSABLE) ×4 IMPLANT
HEAD METAL ON METAL PLUS 6MM (Hips) ×1 IMPLANT
HOLDER FOLEY CATH W/STRAP (MISCELLANEOUS) ×2 IMPLANT
HOOD PEEL AWAY FLYTE STAYCOOL (MISCELLANEOUS) ×8 IMPLANT
IMMOBILIZER KNEE 20 (SOFTGOODS)
IMMOBILIZER KNEE 20 THIGH 36 (SOFTGOODS) IMPLANT
IV NS IRRIG 3000ML ARTHROMATIC (IV SOLUTION) ×2 IMPLANT
KIT BASIN OR (CUSTOM PROCEDURE TRAY) ×2 IMPLANT
KIT TURNOVER KIT A (KITS) IMPLANT
LINER NEUTRAL 52X36X52 PLUS 4 (Liner) ×1 IMPLANT
NDL HYPO 21X1.5 SAFETY (NEEDLE) ×1 IMPLANT
NDL MAYO CATGUT SZ4 TPR NDL (NEEDLE) IMPLANT
NDL SAFETY ECLIPSE 18X1.5 (NEEDLE) ×1 IMPLANT
NEEDLE HYPO 18GX1.5 SHARP (NEEDLE) ×2
NEEDLE HYPO 21X1.5 SAFETY (NEEDLE) ×2 IMPLANT
NEEDLE MAYO CATGUT SZ4 (NEEDLE) IMPLANT
NS IRRIG 1000ML POUR BTL (IV SOLUTION) ×2 IMPLANT
PACK TOTAL JOINT (CUSTOM PROCEDURE TRAY) ×2 IMPLANT
PASSER SUT SWANSON 36MM LOOP (INSTRUMENTS) IMPLANT
PIN SECTOR W/GRIP ACE CUP 52MM (Hips) ×1 IMPLANT
PROTECTOR NERVE ULNAR (MISCELLANEOUS) ×2 IMPLANT
SLEEVE FEM PROX 20D LRG (Hips) ×1 IMPLANT
SPONGE T-LAP 18X18 ~~LOC~~+RFID (SPONGE) IMPLANT
STEM FEM MOD STD 42 15X20X165 (Hips) ×1 IMPLANT
SUT ETHIBOND 2 V 37 (SUTURE) ×6 IMPLANT
SUT ETHIBOND NAB CT1 #1 30IN (SUTURE) ×2 IMPLANT
SUT VIC AB 0 CT1 36 (SUTURE) ×2 IMPLANT
SUT VIC AB 1 CTX 36 (SUTURE) ×2
SUT VIC AB 1 CTX36XBRD ANBCTR (SUTURE) ×1 IMPLANT
SUT VIC AB 3-0 CT1 27 (SUTURE) ×2
SUT VIC AB 3-0 CT1 TAPERPNT 27 (SUTURE) ×1 IMPLANT
SWAB COLLECTION DEVICE MRSA (MISCELLANEOUS) IMPLANT
SWAB CULTURE ESWAB REG 1ML (MISCELLANEOUS) IMPLANT
SYR CONTROL 10ML LL (SYRINGE) ×4 IMPLANT
TOWEL OR 17X26 10 PK STRL BLUE (TOWEL DISPOSABLE) ×2 IMPLANT
TOWEL OR NON WOVEN STRL DISP B (DISPOSABLE) ×2 IMPLANT
TOWER CARTRIDGE SMART MIX (DISPOSABLE) IMPLANT
TRAY FOLEY MTR SLVR 14FR STAT (SET/KITS/TRAYS/PACK) ×2 IMPLANT
TUBE SUCTION HIGH CAP CLEAR NV (SUCTIONS) ×1 IMPLANT
WATER STERILE IRR 1000ML POUR (IV SOLUTION) ×4 IMPLANT

## 2021-09-30 NOTE — Op Note (Signed)
PATIENT ID:      Tiffany Fox  MRN:     [id number] DOB/AGE:    02/11/1934 / 87 y.o.       OPERATIVE REPORT    DATE OF PROCEDURE:  09/30/2021       PREOPERATIVE DIAGNOSIS:  RETAINED LEFT FEMORAL NAIL AND LEFT HIP OSTEOARTHRITS                                                       Estimated body mass index is 21.08 kg/m as calculated from the following:   Height as of 09/23/21: 5\' 3"  (1.6 m).   Weight as of 09/23/21: 54 kg.     POSTOPERATIVE DIAGNOSIS: Same                                                          PROCEDURE: Removal of left hip Smith [employer] Nephew IM at bedtime nail with locking screw.  L total hip arthroplasty using a 52 mm DePuy Gryption Cup, 10-degree polyethylene liner index posterior, a +6 36 mm ceramic head, a [id number] SROM Stem, 20DL Sleeve  SURGEON: [name]    ASSISTANT:   Kerry Hough. [employer]  (present throughout entire procedure and necessary for timely completion of the procedure)  ANESTHESIA: Spinal BLOOD LOSS: 400 cc FLUID REPLACEMENT: 1600 crystalloid Tranexamic Acid: 1gm IV, 2gm Topical Exparel: 10cc DRAINS: None COMPLICATIONS: None    INDICATIONS FOR PROCEDURE:Patient with end-stage arthritis of the left hip.  X-rays show bone-on-bone arthritic changes, peri chondral cyts.  Patient also has a retained Smith & Nephew IM at bedtime nail that was placed in 2011. Despite conservative measures with observation, anti-inflammatory medicine, narcotics, use of a cane, has severe unremitting pain and can ambulate only 1 blocks before resting.  Patient desires elective right total hip arthroplasty to decrease pain and increase function. The risks, benefits, and alternatives were discussed at length including but not limited to the risks of infection, bleeding, nerve injury, stiffness, blood clots, the need for revision surgery, cardiopulmonary complications, among others, and they were willing to proceed.Benefits have been discussed. Questions answered.      PROCEDURE IN DETAIL: The patient was identified by armband,  received preoperative IV antibiotics in the holding area at [facility], taken to the operating room , appropriate anesthetic monitors  were attached and general endotracheal anesthesia induced. Foley catheter was inserted. Patient was rolled into the R lateral decubitus position and fixed there with a Stulberg Mark II pelvic clamp and the L lower extremity was then prepped and draped  in the usual sterile fashion from the ankle to the hemipelvis. A time-out  procedure was performed. Kerry Hough. Hardin Negus [facility] was present and scrubbed throughout the case, critical for assistance with, positioning, exposure, retraction, instrumentation, and closure.The skin along the lateral hip and thigh  infiltrated with 10 mL of 0.5% Marcaine and epinephrine solution. We  then made a posterolateral approach to the hip. With a #10 blade, 18 cm  incision through skin and subcutaneous tissue down to the level of the  IT band. Small bleeders were identified and cauterized. IT band cut in  line with skin incision exposing the greater trochanter.  We are able to palpate the compression screw of the IM at bedtime nail through the fibers of the vastus lateralis which was split exposing the head of the screw which was removed without difficulty.  We attempted to get the sleeve out through this approach using small handset osteotomes clearing completely around the sleeve but it would not budge.  At this point we elected to go ahead and takedown the hip joint capsule off the intertrochanteric crest tagging it with #2 Ethibond sutures and dislocating the femoral head.  Using a power saw we then made a standard neck cut and piecemeal removed to the femoral head and neck from around the lag screw.  Once this had been accomplished we are able to remove the lag screw antegrade but the sleeve remained incarcerated and again we had to take small osteotomes go around the  sleeve working down the neck and finally were able to push the sleeve out laterally removing it.  The top of the nail was then instrumented with the conical bolt and extracted using the slaphammer.  With the hip flexed and internally rotated we exposed the acetabulum removed the remainder of the labrum and sequentially reamed up to a 51 mm basket reamer obtaining good cut into the subcortical bone.  Because of the patient's age we selected a 52 mm sector Gryption cup which was hammered into place and 45 degrees of abduction and 20 degrees of anteversion after first irrigating out the acetabulum.  Good fit was accomplished and no supplemental screws were required.  We then selected a 10 degree liner and hammered it into the shell with the index posterior and slightly superior.  There was then flexed and internally rotated exposing the proximal femur we sequentially reamed up to a 14.5 mm S-ROM cortical reamer before we obtained a little bit of chatter continued reaming up to 15.5 mm full depth and reamed to 16 mm partial depth.  We then conically reamed up to a 16 delta cone to the appropriate depth for a 42 base neck.  In the calcar milled out to a 20 the large sleeve.  A trial sleeve was then hammered into place followed by a trial stem with a 42 base neck.  It was placed in 10 degrees of anteversion in relation to the neck for a total anteversion in relation to the knee of about 20 degrees.  We then performed trial reduction with a +0 and a +6 36 mm ball and found the best stability with a +6.  Excellent stability was noted with at 90 of flexion with 60 of internal rotation and then full extension withexternal rotation. The hip  could not be dislocated in full extension. The knee could easily flex  to about 140 degrees. We also stretched the abductors at this point,  because of the preexisting adductor contractures. All trial components  were then removed. The real sleeve was then hammered into place, through  the sleeve we reamed with a 15.5 reamer to ensure that the stem did not become incarcerated. The stem itself was then inserted in 10 deg anteversion in relation to the calcar.  At this point, a + 6 36-mm ceramic head was  hammered on the stem. The hip was reduced. We checked our stability  one more time and found to be excellent. The wound was once again  thoroughly irrigated out with normal saline solution pulse lavage. The  capsular flap and short  external rotators were repaired back to the  intertrochanteric crest through drill holes with a #2 Ethibond suture.  The IT band was closed with running 1 Vicryl suture. The subcutaneous  tissue with 0 and 2-0 undyed Vicryl suture and the skin with running  3-0 Vivryl SQ suture. Aquacil dessing was applied. The patient was then unclamped, rolled supine, awaken extubated and taken to recovery room without difficulty in stable condition.   [name] 09/30/2021, 9:07 AM

## 2021-09-30 NOTE — Interval H&P Note (Signed)
History and Physical Interval Note:  09/30/2021 9:06 AM  Tiffany Fox  has presented today for surgery, with the diagnosis of RETAINED LEFT FEMORAL NAIL AND LEFT HIP OSTEOARTHRITS.  The various methods of treatment have been discussed with the patient and family. After consideration of risks, benefits and other options for treatment, the patient has consented to  Procedure(s): LEFT TOTAL HIP REVISION POSTERIOR AND NAIL REMOVAL (Left) as a surgical intervention.  The patient's history has been reviewed, patient examined, no change in status, stable for surgery.  I have reviewed the patient's chart and labs.  Questions were answered to the patient's satisfaction.     [name]

## 2021-09-30 NOTE — Transfer of Care (Signed)
Immediate Anesthesia Transfer of Care Note  Patient: Tiffany Fox  Procedure(s) Performed: LEFT TOTAL HIP REVISION POSTERIOR AND NAIL REMOVAL (Left: Hip)  Patient Location: PACU  Anesthesia Type:Spinal  Level of Consciousness: drowsy and patient cooperative  Airway & Oxygen Therapy: Patient Spontanous Breathing and Patient connected to face mask oxygen  Post-op Assessment: Report given to RN and Post -op Vital signs reviewed and stable  Post vital signs: Reviewed and stable  Last Vitals:  Vitals Value Taken Time  BP 103/57 09/30/21 1627  Temp    Pulse 71 09/30/21 1628  Resp 10 09/30/21 1628  SpO2 100 % 09/30/21 1628  Vitals shown include unvalidated device data.  Last Pain:  Vitals:   09/30/21 0845  TempSrc: Oral         Complications: No notable events documented.

## 2021-09-30 NOTE — Anesthesia Procedure Notes (Addendum)
Spinal  Patient location during procedure: OR Start time: 09/30/2021 1:14 PM End time: 09/30/2021 1:18 PM Reason for block: surgical anesthesia Staffing Anesthesiologist: Janeece Riggers, MD Preanesthetic Checklist Completed: patient identified, IV checked, site marked, risks and benefits discussed, surgical consent, monitors and equipment checked, pre-op evaluation and timeout performed Spinal Block Patient position: sitting Prep: DuraPrep Patient monitoring: heart rate, cardiac monitor, continuous pulse ox and blood pressure Approach: midline Location: L2-3 Injection technique: single-shot Needle Needle type: Sprotte  Needle gauge: 24 G Needle length: 9 cm Assessment Sensory level: T4 Events: CSF return and second provider Additional Notes Very difficult spinal secondary to spondylosis and disc segregation

## 2021-09-30 NOTE — Discharge Instructions (Signed)

## 2021-09-30 NOTE — Plan of Care (Signed)
°  Problem: Education: Goal: Knowledge of General Education information will improve Description: Including pain rating scale, medication(s)/side effects and non-pharmacologic comfort measures Outcome: Progressing   Problem: Clinical Measurements: Goal: Respiratory complications will improve Outcome: Progressing   Problem: Clinical Measurements: Goal: Cardiovascular complication will be avoided Outcome: [place], RN 09/30/21 7:44 PM

## 2021-10-01 LAB — CBC
HCT: 29.7 % — ABNORMAL LOW (ref 36.0–46.0)
Hemoglobin: 9.6 g/dL — ABNORMAL LOW (ref 12.0–15.0)
MCH: 32.9 pg (ref 26.0–34.0)
MCHC: 32.3 g/dL (ref 30.0–36.0)
MCV: 101.7 fL — ABNORMAL HIGH (ref 80.0–100.0)
Platelets: 184 10*3/uL (ref 150–400)
RBC: 2.92 MIL/uL — ABNORMAL LOW (ref 3.87–5.11)
RDW: 14.6 % (ref 11.5–15.5)
WBC: 5.3 10*3/uL (ref 4.0–10.5)
nRBC: 0 % (ref 0.0–0.2)

## 2021-10-01 LAB — BASIC METABOLIC PANEL
Anion gap: 3 — ABNORMAL LOW (ref 5–15)
BUN: 13 mg/dL (ref 8–23)
CO2: 23 mmol/L (ref 22–32)
Calcium: 8 mg/dL — ABNORMAL LOW (ref 8.9–10.3)
Chloride: 108 mmol/L (ref 98–111)
Creatinine, Ser: 0.8 mg/dL (ref 0.44–1.00)
GFR, Estimated: >60 mL/min (ref >60)
Glucose, Bld: 251 mg/dL — ABNORMAL HIGH (ref 70–99)
Potassium: 4.4 mmol/L (ref 3.5–5.1)
Sodium: 134 mmol/L — ABNORMAL LOW (ref 135–145)

## 2021-10-01 NOTE — Anesthesia Postprocedure Evaluation (Signed)
Anesthesia Post Note  Patient: Tiffany Fox  Procedure(s) Performed: LEFT TOTAL HIP REVISION POSTERIOR AND NAIL REMOVAL (Left: Hip)     Patient location during evaluation: PACU Anesthesia Type: MAC and Spinal Level of consciousness: oriented and awake and alert Pain management: pain level controlled Vital Signs Assessment: post-procedure vital signs reviewed and stable Respiratory status: spontaneous breathing, respiratory function stable and patient connected to nasal cannula oxygen Cardiovascular status: blood pressure returned to baseline and stable Postop Assessment: no headache, no backache and no apparent nausea or vomiting Anesthetic complications: no   No notable events documented.  Last Vitals:  Vitals:   10/01/21 0559 10/01/21 0949  BP: 140/87 121/66  Pulse: 75 80  Resp: 15 18  Temp: (!) 36.4 C 36.7 C  SpO2: 98% 97%    Last Pain:  Vitals:   10/01/21 0949  TempSrc: Oral  PainSc:                  Lavina Resor

## 2021-10-01 NOTE — Progress Notes (Signed)
PATIENT ID: Tiffany Fox  MRN: [id number]  DOB/AGE:  [birth year] / 87 y.o.  1 Day Post-Op Procedure(s) (LRB): LEFT TOTAL HIP REVISION POSTERIOR AND NAIL REMOVAL (Left)    PROGRESS NOTE Subjective: Patient is alert, oriented, no Nausea, no Vomiting, yes passing gas, . Taking PO well. Denies SOB, Chest or Calf Pain. Using Incentive Spirometer, PAS in place. Ambulate WBAT Patient reports pain as  4/10  .    Objective: Vital signs in last 24 hours: Vitals:   09/30/21 1847 09/30/21 2021 10/01/21 0159 10/01/21 0559  BP:  140/70 121/77 140/87  Pulse:  (!) 107 72 75  Resp:  16 15 15   Temp:  98.1 F (36.7 C) 97.7 F (36.5 C) (!) 97.5 F (36.4 C)  TempSrc:  Oral Oral Oral  SpO2:  100% 98% 98%  Weight: 54 kg     Height: 5\' 3"  (1.6 m)         Intake/Output from previous day: I/O last 3 completed shifts: In: 3800.5 [P.O.:360; I.V.:3340.5; IV Piggyback:100] Out: 2125 [Urine:1725; Blood:400]   Intake/Output this shift: No intake/output data recorded.   LABORATORY DATA: Recent Labs    10/01/21 0322  WBC 5.3  HGB 9.6*  HCT 29.7*  PLT 184  NA 134*  K 4.4  CL 108  CO2 23  BUN 13  CREATININE 0.80  GLUCOSE 251*  CALCIUM 8.0*    Examination: Neurologically intact ABD soft Neurovascular intact Sensation intact distally Intact pulses distally Dorsiflexion/Plantar flexion intact Incision: scant drainage No cellulitis present Compartment soft} XR AP&Lat of hip shows well placed\fixed THAUsing DePuy Pinnacle shell and S-ROM stem.  Assessment:   1 Day Post-Op Procedure(s) (LRB): LEFT TOTAL HIP REVISION POSTERIOR AND NAIL REMOVAL (Left) ADDITIONAL DIAGNOSIS:  Expected Acute Blood Loss Anemia, Leukopenia pancytopenia Anticipated LOS equal to or greater than 2 midnights due to - Age 85 and older with one or more of the following:  - Obesity  - Expected need for hospital services (PT, OT, Nursing) required for safe  discharge  - Anticipated need for postoperative skilled  nursing care or inpatient rehab  - Procedure on an inpatient only list  Plan: PT/OT WBAT, THA  DVT Prophylaxis: SCDx72 hrs, ASA 81 mg BID x 2 weeks  DISCHARGE PLAN: HomeI am a when patient passed his physical therapy.  May be today or tomorrow.  DISCHARGE NEEDS: HHPT, Walker, and 3-in-1 comode seat Patient ID: Tiffany Fox, female   DOB: June 17, 1934, 87 y.o.   MRN: [id number]

## 2021-10-01 NOTE — Plan of Care (Signed)
Plan of care reviewed and discussed with the patient. 

## 2021-10-01 NOTE — TOC Transition Note (Signed)
Transition of Care [facility]) - CM/SW Discharge Note  Patient Details  Name: Tiffany Fox MRN: [id number] Date of Birth: 1934/07/21  Transition of Care [facility]) CM/SW Contact:  Sherie Don, LCSW Phone Number: 10/01/2021, 1:34 PM  Clinical Narrative: Patient is expected to discharge home after working with PT. Patient has been prearranged with [facility] for HHPT, which is the preferred discharge plan of the ortho team rather than discharging to SNF. Patient has a rolling walker and raised toilet seat with handles at home, so there are no DME needs at this time. TOC signing off at this time, but can be consulted again if discharge plan changes.  Final next level of care: [place] Barriers to Discharge: No Barriers Identified  Patient Goals and CMS Choice Patient states their goals for this hospitalization and ongoing recovery are:: Discharge home with [place] CMS Medicare.gov Compare Post Acute Care list provided to:: Patient Choice offered to / list presented to : Patient  Discharge Plan and Services         DME Arranged: N/A DME Agency: NA HH Arranged: PT HH Agency: [place] Representative spoke with at [place] in orthopedist's office  Readmission Risk Interventions No flowsheet data found.

## 2021-10-01 NOTE — Evaluation (Signed)
Physical Therapy Evaluation Patient Details Name: Tiffany Fox MRN: [id number] DOB: 19-Jul-1934 Today's Date: 10/01/2021  History of Present Illness  Pt is an 87 year old female s/p L THA posterior approach on 09/30/21.  PMHx significant for but not limited to R TKA, anxiety, HTN  Clinical Impression  Pt is s/p THA resulting in the deficits listed below (see PT Problem List).Pt will benefit from skilled PT to increase their independence and safety with mobility to allow discharge to the venue listed below.  Pt requiring assist and only tolerated limited distance with first time mobilizing.  Pt educated on posterior hip precautions and required cues for maintaining during mobility.  Pt reports her daughter from [place] can come stay with her upon d/c home for about a week.  Daughter to be here for afternoon session per pt ,so will provide further family education and safe mobilizing this afternoon.       Recommendations for follow up therapy are one component of a multi-disciplinary discharge planning process, led by the attending physician.  Recommendations may be updated based on patient status, additional functional criteria and insurance authorization.  Follow Up Recommendations Follow physician's recommendations for discharge plan and follow up therapies (plan for HHPT per Dr. Mayer Camel)    Assistance Recommended at Discharge Frequent or constant Supervision/Assistance  Functional Status Assessment Patient has had a recent decline in their functional status and demonstrates the ability to make significant improvements in function in a reasonable and predictable amount of time.  Equipment Recommendations  Rolling walker (2 wheels)    Recommendations for Other Services       Precautions / Restrictions Precautions Precautions: Posterior Hip;Fall Precaution Comments: pt educated on posterior hip precautions Restrictions Weight Bearing Restrictions: No Other Position/Activity  Restrictions: WBAT      Mobility  Bed Mobility Overal bed mobility: Needs Assistance Bed Mobility: Supine to Sit     Supine to sit: Min assist     General bed mobility comments: assist for Lt LE, cues for maintaining precautions    Transfers Overall transfer level: Needs assistance Equipment used: Rolling walker (2 wheels) Transfers: Sit to/from Stand Sit to Stand: Min assist           General transfer comment: verbal cues for UE and LE positioning, assist to rise and steady    Ambulation/Gait Ambulation/Gait assistance: Min guard;Min assist Gait Distance (Feet): 12 Feet Assistive device: Rolling walker (2 wheels) Gait Pattern/deviations: Step-to pattern;Decreased stance time - left;Trunk flexed;Antalgic Gait velocity: decr     General Gait Details: verbal cues for sequence, RW position, posture, pt limited by pain and fatigue so only able to tolerate room distance  Stairs            Wheelchair Mobility    Modified Rankin (Stroke Patients Only)       Balance                                             Pertinent Vitals/Pain Pain Assessment: 0-10 Pain Score: 5  Pain Location: left hip Pain Descriptors / Indicators: Aching;Sore Pain Intervention(s): Repositioned;Monitored during session;Ice applied    Home Living Family/patient expects to be discharged to:: Private residence Living Arrangements: Alone Available Help at Discharge: Family;Available 24 hours/day (daughter from [place] to stay with pt a week after surgery) Type of Home: House Home Access: Stairs to enter   [employer]  of Steps: 1   Home Layout: One level Home Equipment: None      Prior Function Prior Level of Function : Independent/Modified Independent                     Hand Dominance        Extremity/Trunk Assessment        Lower Extremity Assessment Lower Extremity Assessment: LLE deficits/detail;Generalized weakness LLE  Deficits / Details: assist for Lt LE due to pain, maintained hip precautions, pt able to perform ankle pumps       Communication   Communication: No difficulties  Cognition Arousal/Alertness: Awake/alert Behavior During Therapy: WFL for tasks assessed/performed Overall Cognitive Status: Within Functional Limits for tasks assessed                                          General Comments      Exercises     Assessment/Plan    PT Assessment Patient needs continued PT services  PT Problem List Decreased strength;Decreased activity tolerance;Decreased mobility;Decreased knowledge of precautions;Pain;Decreased knowledge of use of DME       PT Treatment Interventions DME instruction;Gait training;Functional mobility training;Therapeutic activities;Patient/family education;Stair training;Therapeutic exercise    PT Goals (Current goals can be found in the Care Plan section)  Acute Rehab PT Goals PT Goal Formulation: With patient Time For Goal Achievement: 10/06/21 Potential to Achieve Goals: Good    Frequency 7X/week   Barriers to discharge        Co-evaluation               AM-PAC PT "6 Clicks" Mobility  Outcome Measure Help needed turning from your back to your side while in a flat bed without using bedrails?: A Little Help needed moving from lying on your back to sitting on the side of a flat bed without using bedrails?: A Little Help needed moving to and from a bed to a chair (including a wheelchair)?: A Little Help needed standing up from a chair using your arms (e.g., wheelchair or bedside chair)?: A Little Help needed to walk in hospital room?: A Lot Help needed climbing 3-5 steps with a railing? : A Lot 6 Click Score: 16    End of Session Equipment Utilized During Treatment: Gait belt Activity Tolerance: Patient limited by pain Patient left: in chair;with call bell/phone within reach;with chair alarm set Nurse Communication: Mobility  status PT Visit Diagnosis: Difficulty in walking, not elsewhere classified (R26.2)    Time: 1856-3149 PT Time Calculation (min) (ACUTE ONLY): 26 min   Charges:   PT Evaluation $PT Eval Low Complexity: 1 Low PT Treatments $Gait Training: 8-22 mins      Jannette Spanner PT, DPT Acute Rehabilitation Services Pager: [phone] Office: [place] 10/01/2021, 4:38 PM

## 2021-10-01 NOTE — Progress Notes (Signed)
Physical Therapy Treatment Patient Details Name: Tiffany Fox MRN: [id number] DOB: Dec 01, 1933 Today's Date: 10/01/2021   History of Present Illness Pt is an 87 year old female s/p L THA posterior approach on 09/30/21.  PMHx significant for but not limited to R TKA, anxiety, HTN    PT Comments    Pt ambulated in hallway and assisted back to bed.  Daughter present for session and educated on posterior hip precautions especially during mobility.  Pt not yet ready for d/c today from PT standpoint, and pt and daughter agreeable.  Daughter anticipates being available again tomorrow for PT session.   Recommendations for follow up therapy are one component of a multi-disciplinary discharge planning process, led by the attending physician.  Recommendations may be updated based on patient status, additional functional criteria and insurance authorization.  Follow Up Recommendations  Follow physician's recommendations for discharge plan and follow up therapies     Assistance Recommended at Discharge Frequent or constant Supervision/Assistance  Equipment Recommendations  Rolling walker (2 wheels)    Recommendations for Other Services       Precautions / Restrictions Precautions Precautions: Posterior Hip;Fall Precaution Comments: reviewed posterior hip precautions with pt and educated pt's daughter, Tiffany Fox Restrictions Weight Bearing Restrictions: No Other Position/Activity Restrictions: WBAT     Mobility  Bed Mobility Overal bed mobility: Needs Assistance Bed Mobility: Sit to Supine     Supine to sit: Min assist Sit to supine: Min guard   General bed mobility comments: increased time and effort, pt utilized gait belt to self assist LE, cues for maintaining hip precautions    Transfers Overall transfer level: Needs assistance Equipment used: Rolling walker (2 wheels) Transfers: Sit to/from Stand Sit to Stand: Min guard           General transfer comment: verbal cues  for UE and LE positioning    Ambulation/Gait Ambulation/Gait assistance: Min guard Gait Distance (Feet): 20 Feet Assistive device: Rolling walker (2 wheels) Gait Pattern/deviations: Step-to pattern;Decreased stance time - left;Trunk flexed;Antalgic Gait velocity: decr     General Gait Details: verbal cues for sequence, RW position, posture, limited by fatigue especially fatigue of UEs   Stairs             Wheelchair Mobility    Modified Rankin (Stroke Patients Only)       Balance                                            Cognition Arousal/Alertness: Awake/alert Behavior During Therapy: WFL for tasks assessed/performed Overall Cognitive Status: Within Functional Limits for tasks assessed                                          Exercises      General Comments        Pertinent Vitals/Pain Pain Assessment: 0-10 Pain Score: 4  Pain Location: left hip Pain Descriptors / Indicators: Aching;Sore Pain Intervention(s): Repositioned;Monitored during session    Home Living Family/patient expects to be discharged to:: Private residence Living Arrangements: Alone Available Help at Discharge: Family;Available 24 hours/day (daughter from [place] to stay with pt a week after surgery) Type of Home: House Home Access: Stairs to enter   [employer] of Steps: 1   Home Layout: One level Home  Equipment: None      Prior Function            PT Goals (current goals can now be found in the care plan section) Acute Rehab PT Goals PT Goal Formulation: With patient Time For Goal Achievement: 10/06/21 Potential to Achieve Goals: Good Progress towards PT goals: Progressing toward goals    Frequency    7X/week      PT Plan Current plan remains appropriate    Co-evaluation              AM-PAC PT "6 Clicks" Mobility   Outcome Measure  Help needed turning from your back to your side while in a flat bed  without using bedrails?: A Little Help needed moving from lying on your back to sitting on the side of a flat bed without using bedrails?: A Little Help needed moving to and from a bed to a chair (including a wheelchair)?: A Little Help needed standing up from a chair using your arms (e.g., wheelchair or bedside chair)?: A Little Help needed to walk in hospital room?: A Little Help needed climbing 3-5 steps with a railing? : A Lot 6 Click Score: 17    End of Session Equipment Utilized During Treatment: Gait belt Activity Tolerance: Patient limited by fatigue Patient left: with call bell/phone within reach;in bed;with family/visitor present;with bed alarm set Nurse Communication: Mobility status PT Visit Diagnosis: Difficulty in walking, not elsewhere classified (R26.2)     Time: 1937-9024 PT Time Calculation (min) (ACUTE ONLY): 22 min  Charges:  $Gait Training: 8-22 mins            Arlyce Dice, DPT Acute Rehabilitation Services Pager: [phone] Office: [place] 10/01/2021, 4:46 PM

## 2021-10-02 LAB — CBC
HCT: 26.5 % — ABNORMAL LOW (ref 36.0–46.0)
Hemoglobin: 8.7 g/dL — ABNORMAL LOW (ref 12.0–15.0)
MCH: 33.5 pg (ref 26.0–34.0)
MCHC: 32.8 g/dL (ref 30.0–36.0)
MCV: 101.9 fL — ABNORMAL HIGH (ref 80.0–100.0)
Platelets: 174 10*3/uL (ref 150–400)
RBC: 2.6 MIL/uL — ABNORMAL LOW (ref 3.87–5.11)
RDW: 14.6 % (ref 11.5–15.5)
WBC: 5.5 10*3/uL (ref 4.0–10.5)
nRBC: 0 % (ref 0.0–0.2)

## 2021-10-02 NOTE — Progress Notes (Signed)
Physical Therapy Treatment Patient Details Name: Tiffany Fox MRN: [id number] DOB: [birth year] Today's Date: 10/02/2021   History of Present Illness Pt is an 87 year old female s/p L THA revision posterior approach on 09/30/21.  PMHx significant for but not limited to R TKA, anxiety, HTN    PT Comments    Pt requiring increased time and effort to mobilize.  Pt reports increased pain today.  Pt in recliner on arrival and attempted to ambulate however limited by pain and requested return to bed.  Daughter present for session.     Recommendations for follow up therapy are one component of a multi-disciplinary discharge planning process, led by the attending physician.  Recommendations may be updated based on patient status, additional functional criteria and insurance authorization.  Follow Up Recommendations  Follow physician's recommendations for discharge plan and follow up therapies     Assistance Recommended at Discharge Frequent or constant Supervision/Assistance  Equipment Recommendations  Rolling walker (2 wheels)    Recommendations for Other Services       Precautions / Restrictions Precautions Precautions: Posterior Hip;Fall Precaution Comments: reviewed posterior hip precautions with pt and pt's daughter, Tiffany Fox Restrictions Weight Bearing Restrictions: No Other Position/Activity Restrictions: WBAT     Mobility  Bed Mobility Overal bed mobility: Needs Assistance Bed Mobility: Sit to Supine       Sit to supine: Min assist   General bed mobility comments: increased time and effort, pt utilized gait belt to self assist LE, cues for maintaining hip precautions, light assist for LEs onto bed    Transfers Overall transfer level: Needs assistance Equipment used: Rolling walker (2 wheels) Transfers: Sit to/from Stand Sit to Stand: Min guard;Min assist           General transfer comment: verbal cues for UE and LE positioning     Ambulation/Gait Ambulation/Gait assistance: Min assist Gait Distance (Feet): 8 Feet Assistive device: Rolling walker (2 wheels) Gait Pattern/deviations: Step-to pattern;Decreased stance time - left;Trunk flexed;Antalgic Gait velocity: decr     General Gait Details: verbal cues for sequence, RW position, posture, limited by pain and difficulty advancing Lt LE today (however had not had any pain meds since last night per RN, who brought pt's pain meds end of session)   Stairs             Wheelchair Mobility    Modified Rankin (Stroke Patients Only)       Balance                                            Cognition Arousal/Alertness: Awake/alert Behavior During Therapy: WFL for tasks assessed/performed Overall Cognitive Status: Within Functional Limits for tasks assessed                                          Exercises      General Comments        Pertinent Vitals/Pain Pain Assessment: Faces Faces Pain Scale: Hurts even more Pain Location: left hip Pain Descriptors / Indicators: Aching;Sore;Grimacing;Guarding Pain Intervention(s): Monitored during session;Repositioned;Patient requesting pain meds-RN notified;RN gave pain meds during session    Home Living  Prior Function            PT Goals (current goals can now be found in the care plan section) Progress towards PT goals: Progressing toward goals    Frequency    7X/week      PT Plan Current plan remains appropriate    Co-evaluation              AM-PAC PT "6 Clicks" Mobility   Outcome Measure  Help needed turning from your back to your side while in a flat bed without using bedrails?: A Little Help needed moving from lying on your back to sitting on the side of a flat bed without using bedrails?: A Little Help needed moving to and from a bed to a chair (including a wheelchair)?: A Little Help needed standing up  from a chair using your arms (e.g., wheelchair or bedside chair)?: A Little Help needed to walk in hospital room?: A Lot Help needed climbing 3-5 steps with a railing? : A Lot 6 Click Score: 16    End of Session Equipment Utilized During Treatment: Gait belt Activity Tolerance: Patient limited by fatigue;Patient limited by pain Patient left: in bed;with bed alarm set;with call bell/phone within reach;with family/visitor present Nurse Communication: Mobility status;Patient requests pain meds PT Visit Diagnosis: Difficulty in walking, not elsewhere classified (R26.2)     Time: 9977-4142 PT Time Calculation (min) (ACUTE ONLY): 26 min  Charges:  $Gait Training: 8-22 mins $Therapeutic Activity: 8-22 mins                    Jannette Spanner PT, DPT Acute Rehabilitation Services Pager: [phone] Office: [place] 10/02/2021, 1:06 PM

## 2021-10-02 NOTE — Progress Notes (Signed)
Patient ID: Tiffany Fox, female   DOB: 1933/11/26, 87 y.o.   MRN: [id number] PATIENT ID: Tiffany Fox  MRN: [id number]  DOB/AGE:  17-Feb-1934 / 87 y.o.  2 Days Post-Op Procedure(s) (LRB): LEFT TOTAL HIP REVISION POSTERIOR AND NAIL REMOVAL (Left)    PROGRESS NOTE Subjective: Patient is alert, oriented, no Nausea, no Vomiting, yes passing gas. Taking PO well. Denies SOB, Chest or Calf Pain. Using Incentive Spirometer, PAS in place. Ambulate 20', Patient reports pain as 4/10 .    Objective: Vital signs in last 24 hours: Vitals:   10/01/21 1404 10/01/21 1743 10/01/21 2205 10/02/21 0547  BP: 119/67 136/77 120/62 119/70  Pulse: 82 85 89 87  Resp: 18 16 16 17   Temp: 98.1 F (36.7 C) 97.8 F (36.6 C) (!) 97.5 F (36.4 C) 97.7 F (36.5 C)  TempSrc: Oral Oral Oral Oral  SpO2: 94% 99% 94% 95%  Weight:      Height:          Intake/Output from previous day: I/O last 3 completed shifts: In: 2414.7 [P.O.:360; I.V.:2054.7] Out: 2025 [Urine:2025]   Intake/Output this shift: No intake/output data recorded.   LABORATORY DATA: Recent Labs    10/01/21 0322 10/02/21 0318  WBC 5.3 5.5  HGB 9.6* 8.7*  HCT 29.7* 26.5*  PLT 184 174  NA 134*  --   K 4.4  --   CL 108  --   CO2 23  --   BUN 13  --   CREATININE 0.80  --   GLUCOSE 251*  --   CALCIUM 8.0*  --     Examination: Neurologically intact ABD soft Neurovascular intact Sensation intact distally Intact pulses distally Dorsiflexion/Plantar flexion intact Incision: scant drainage No cellulitis present Compartment soft} No new drainage on the dressing  Assessment:   2 Days Post-Op Procedure(s) (LRB): LEFT TOTAL HIP REVISION POSTERIOR AND NAIL REMOVAL (Left) ADDITIONAL DIAGNOSIS: Expected Acute Blood Loss Anemia, leukopenia   Plan: PT/OT WBAT, AROM and PROM  DVT Prophylaxis:  SCDx72hrs, ASA 81 mg BID x 2 weeks DISCHARGE PLAN: Home, When patient passes physical therapy aspect to DISCHARGE NEEDS: HHPT, Walker, and  3-in-1 comode seat     [name] 10/02/2021, 8:04 AM

## 2021-10-02 NOTE — Progress Notes (Signed)
Physical Therapy Treatment Patient Details Name: Tiffany Fox MRN: [id number] DOB: 04-Nov-1933 Today's Date: 10/02/2021   History of Present Illness Pt is an 87 year old female s/p L THA revision posterior approach on 09/30/21.  PMHx significant for but not limited to R TKA, anxiety, HTN    PT Comments    Pt unable to recall posterior hip precautions so verbally reviewed and therapist demonstrated and also pt cued for precautions during mobility.  Pt reports pain improved (premedicated for session) and practiced one step with daughter observing.  Pt's daughter also provided occasional cues for technique and precautions for pt.  Pt feels more comfortable remaining in hospital tonight in hopes of keeping pain controlled prior to d/c home however appears ready for d/c home likely tomorrow after one session.  Daughter assisting pt at home was present and able to provide pt with appropriate cues to perform safe mobility at home.    Recommendations for follow up therapy are one component of a multi-disciplinary discharge planning process, led by the attending physician.  Recommendations may be updated based on patient status, additional functional criteria and insurance authorization.  Follow Up Recommendations  Follow physician's recommendations for discharge plan and follow up therapies     Assistance Recommended at Discharge Frequent or constant Supervision/Assistance  Equipment Recommendations  Rolling walker (2 wheels)    Recommendations for Other Services       Precautions / Restrictions Precautions Precautions: Posterior Hip;Fall Precaution Comments: reviewed posterior hip precautions with pt and pt's daughter, Tiffany Fox Restrictions Weight Bearing Restrictions: No Other Position/Activity Restrictions: WBAT     Mobility  Bed Mobility Overal bed mobility: Needs Assistance Bed Mobility: Sit to Supine       Sit to supine: Min assist   General bed mobility comments: pt in  recliner    Transfers Overall transfer level: Needs assistance Equipment used: Rolling walker (2 wheels) Transfers: Sit to/from Stand Sit to Stand: Min guard           General transfer comment: verbal cues for UE and LE positioning, increased time required    Ambulation/Gait Ambulation/Gait assistance: Min guard Gait Distance (Feet): 20 Feet Assistive device: Rolling walker (2 wheels) Gait Pattern/deviations: Step-to pattern;Decreased stance time - left;Trunk flexed;Antalgic Gait velocity: decr     General Gait Details: verbal cues for sequence, RW position, posture   Stairs Stairs: Yes Stairs assistance: Min guard Stair Management: Step to pattern;Backwards;Forwards;With walker Number of Stairs: 1 General stair comments: verbal cues for safety and sequence; performed forwards and then again backwards; daughter present and observed   Wheelchair Mobility    Modified Rankin (Stroke Patients Only)       Balance                                            Cognition Arousal/Alertness: Awake/alert Behavior During Therapy: WFL for tasks assessed/performed Overall Cognitive Status: Within Functional Limits for tasks assessed                                          Exercises      General Comments        Pertinent Vitals/Pain Pain Assessment: 0-10 Pain Score: 2  Faces Pain Scale: Hurts even more Pain Location: left hip Pain Descriptors / Indicators:  Aching;Sore Pain Intervention(s): Repositioned;Premedicated before session;Monitored during session    Home Living                          Prior Function            PT Goals (current goals can now be found in the care plan section) Progress towards PT goals: Progressing toward goals    Frequency    7X/week      PT Plan Current plan remains appropriate    Co-evaluation              AM-PAC PT "6 Clicks" Mobility   Outcome Measure  Help  needed turning from your back to your side while in a flat bed without using bedrails?: A Little Help needed moving from lying on your back to sitting on the side of a flat bed without using bedrails?: A Little Help needed moving to and from a bed to a chair (including a wheelchair)?: A Little Help needed standing up from a chair using your arms (e.g., wheelchair or bedside chair)?: A Little Help needed to walk in hospital room?: A Little Help needed climbing 3-5 steps with a railing? : A Lot 6 Click Score: 17    End of Session Equipment Utilized During Treatment: Gait belt Activity Tolerance: Patient tolerated treatment well Patient left: in chair;with call bell/phone within reach;with chair alarm set;with family/visitor present Nurse Communication: Mobility status PT Visit Diagnosis: Difficulty in walking, not elsewhere classified (R26.2)     Time: 7001-7494 PT Time Calculation (min) (ACUTE ONLY): 41 min  Charges:  $Gait Training: 23-37 mins $Therapeutic Activity: 8-22 mins                    Jannette Spanner PT, DPT Acute Rehabilitation Services Pager: [phone] Office: [place] 10/02/2021, 3:50 PM

## 2021-10-03 LAB — CBC
HCT: 26.3 % — ABNORMAL LOW (ref 36.0–46.0)
Hemoglobin: 8.8 g/dL — ABNORMAL LOW (ref 12.0–15.0)
MCH: 33.8 pg (ref 26.0–34.0)
MCHC: 33.5 g/dL (ref 30.0–36.0)
MCV: 101.2 fL — ABNORMAL HIGH (ref 80.0–100.0)
Platelets: 186 10*3/uL (ref 150–400)
RBC: 2.6 MIL/uL — ABNORMAL LOW (ref 3.87–5.11)
RDW: 14.7 % (ref 11.5–15.5)
WBC: 4.8 10*3/uL (ref 4.0–10.5)
nRBC: 0 % (ref 0.0–0.2)

## 2021-10-03 NOTE — Progress Notes (Addendum)
Physical Therapy Treatment Patient Details Name: Tiffany Fox MRN: [id number] DOB: [birth year] Today's Date: 10/03/2021   History of Present Illness Pt is an 87 year old female s/p L THA revision posterior approach on 09/30/21.  PMHx significant for but not limited to R TKA, anxiety, HTN    PT Comments    Pt requiring increased time and frequent cues for technique and precautions.  Pt's cognition appears different today (decreased) likely from IV pain meds last night.  Daughter feels okay with pt d/c home today.  Reviewed safety and posterior hip precautions with daughter who feels she will be able to assist pt at home with this.      Recommendations for follow up therapy are one component of a multi-disciplinary discharge planning process, led by the attending physician.  Recommendations may be updated based on patient status, additional functional criteria and insurance authorization.  Follow Up Recommendations  Follow physician's recommendations for discharge plan and follow up therapies     Assistance Recommended at Discharge Frequent or constant Supervision/Assistance  Equipment Recommendations  BSC/3in1    Recommendations for Other Services       Precautions / Restrictions Precautions Precautions: Posterior Hip;Fall Precaution Comments: reviewed posterior hip precautions with pt and pt's daughter, [name] Restrictions Weight Bearing Restrictions: No LLE Weight Bearing: Weight bearing as tolerated     Mobility  Bed Mobility               General bed mobility comments: pt in recliner    Transfers Overall transfer level: Needs assistance Equipment used: Rolling walker (2 wheels) Transfers: Sit to/from Stand Sit to Stand: Min guard           General transfer comment: verbal cues for UE and LE positioning, increased time required    Ambulation/Gait Ambulation/Gait assistance: Min guard Gait Distance (Feet): 10 Feet Assistive device: Rolling walker (2  wheels) Gait Pattern/deviations: Step-to pattern;Decreased stance time - left;Trunk flexed;Antalgic Gait velocity: decr     General Gait Details: verbal cues for sequence, RW position, posture, increased time and frequent cues required due to cognition   Stairs             Wheelchair Mobility    Modified Rankin (Stroke Patients Only)       Balance                                            Cognition   Behavior During Therapy: WFL for tasks assessed/performed Overall Cognitive Status: Within Functional Limits for tasks assessed                                 General Comments: appears more confused today, slow to process but able to follow commands with increased time        Exercises      General Comments        Pertinent Vitals/Pain Pain Assessment: Faces Faces Pain Scale: Hurts little more Pain Location: left hip Pain Descriptors / Indicators: Aching;Sore Pain Intervention(s): Repositioned;Monitored during session;Premedicated before session    Home Living                          Prior Function            PT Goals (current goals can now  be found in the care plan section) Progress towards PT goals: Progressing toward goals    Frequency    7X/week      PT Plan Current plan remains appropriate    Co-evaluation              AM-PAC PT "6 Clicks" Mobility   Outcome Measure  Help needed turning from your back to your side while in a flat bed without using bedrails?: A Little Help needed moving from lying on your back to sitting on the side of a flat bed without using bedrails?: A Little Help needed moving to and from a bed to a chair (including a wheelchair)?: A Little Help needed standing up from a chair using your arms (e.g., wheelchair or bedside chair)?: A Little Help needed to walk in hospital room?: A Little Help needed climbing 3-5 steps with a railing? : A Lot 6 Click Score: 17     End of Session Equipment Utilized During Treatment: Gait belt Activity Tolerance: Patient tolerated treatment well Patient left: in chair;with call bell/phone within reach;with chair alarm set;with family/visitor present Nurse Communication: Mobility status PT Visit Diagnosis: Difficulty in walking, not elsewhere classified (R26.2)     Time: 3810-1751 PT Time Calculation (min) (ACUTE ONLY): 28 min  Charges:  $Gait Training: 23-37 mins           Jannette Spanner PT, DPT Acute Rehabilitation Services Pager: [phone] Office: [place] 10/03/2021, 12:20 PM

## 2021-10-03 NOTE — Progress Notes (Signed)
The patient is alert and oriented and has been seen by her physician. The orders for discharge were written. IV has been removed. Went over discharge instructions with patient and family. She is being discharged via wheelchair with all of her belongings.  

## 2021-10-03 NOTE — Plan of Care (Signed)
  Problem: Pain Managment: Goal: General experience of comfort will improve Outcome: Progressing   

## 2021-10-03 NOTE — Care Management Important Message (Signed)
Important Message  Patient Details IM Letter given to the Patient Name: Tiffany Fox MRN: [id number] Date of Birth: September 29, 1934   Medicare Important Message Given:  Yes     [name] 10/03/2021, 11:40 AM

## 2021-10-03 NOTE — TOC Progression Note (Signed)
Transition of Care [facility]) - Progression Note   Patient Details  Name: Tiffany Fox MRN: [id number] Date of Birth: 06-15-1934  Transition of Care [facility]) CM/SW [place], LCSW Phone Number: 10/03/2021, 10:58 AM  Clinical Narrative: Daughter and patient are now requesting a 3N1. CSW ordered 3N1 from Celeste with [employer]. [employer] to deliver to patient's room. Per daughter, patient will discharge home with her to [address] [place], [place] [address]. CSW updated Stacie with [facility].   Barriers to Discharge: No Barriers Identified  Expected Discharge Plan and Services Expected Discharge Date: 10/03/21               DME Arranged: 3-N-1 DME Agency: [employer] Date DME Agency Contacted: 10/03/21 Representative spoke with at DME Agency: [place]: PT [place]: [place] Representative spoke with at [place] in orthopedist's office  Readmission Risk Interventions No flowsheet data found.

## 2021-10-03 NOTE — Discharge Summary (Signed)
Patient ID: Tiffany Fox MRN: [id number] DOB/AGE: 1933/10/26 87 y.o.  Admit date: 09/30/2021 Discharge date: 10/03/2021  Admission Diagnoses:  Principal Problem:   Osteoarthritis of left hip Active Problems:   H/O total hip arthroplasty, left   Discharge Diagnoses:  Same  Past Medical History:  Diagnosis Date   Anxiety    Arthritis    Cervical spondylosis    Depression    Hypertension    Hypothyroidism    Leukocytopenia    Osteoporosis     Surgeries: Procedure(s): LEFT TOTAL HIP REVISION POSTERIOR AND NAIL REMOVAL on 09/30/2021   Consultants:   Discharged Condition: Improved  Hospital Course: Tiffany Fox is an 87 y.o. female who was admitted 09/30/2021 for operative treatment ofOsteoarthritis of left hip. Patient has severe unremitting pain that affects sleep, daily activities, and work/hobbies. After pre-op clearance the patient was taken to the operating room on 09/30/2021 and underwent  Procedure(s): LEFT TOTAL HIP REVISION POSTERIOR AND NAIL REMOVAL.    Patient was given perioperative antibiotics:  Anti-infectives (From admission, onward)    Start     Dose/Rate Route Frequency Ordered Stop   09/30/21 1122  ceFAZolin (ANCEF) 2-4 GM/100ML-% IVPB       Note to Pharmacy: Georgena Spurling W: cabinet override      09/30/21 1122 09/30/21 1322   09/30/21 0830  ceFAZolin (ANCEF) IVPB 2g/100 mL premix        2 g 200 mL/hr over 30 Minutes Intravenous On call to O.R. 09/30/21 3295 09/30/21 1330        Patient was given sequential compression devices, early ambulation, and chemoprophylaxis to prevent DVT.  Patient benefited maximally from hospital stay and there were no complications.    Recent vital signs: Patient Vitals for the past 24 hrs:  BP Temp Temp src Pulse Resp SpO2  10/03/21 0706 (!) 145/85 98.3 F (36.8 C) Oral (!) 104 16 97 %  10/02/21 2243 (!) 151/73 98.1 F (36.7 C) Oral 99 18 97 %  10/02/21 2102 115/60 98.2 F (36.8 C) -- 86 16 96 %   10/02/21 1343 (!) 112/56 98.3 F (36.8 C) Oral 79 18 96 %     Recent laboratory studies:  Recent Labs    10/01/21 0322 10/02/21 0318 10/03/21 0314  WBC 5.3 5.5 4.8  HGB 9.6* 8.7* 8.8*  HCT 29.7* 26.5* 26.3*  PLT 184 174 186  NA 134*  --   --   K 4.4  --   --   CL 108  --   --   CO2 23  --   --   BUN 13  --   --   CREATININE 0.80  --   --   GLUCOSE 251*  --   --   CALCIUM 8.0*  --   --      Discharge Medications:   Allergies as of 10/03/2021       Reactions   Lisinopril Rash        Medication List     STOP taking these medications    ibuprofen 200 MG tablet Commonly known as: ADVIL   oxyCODONE 5 MG immediate release tablet Commonly known as: Roxicodone   traMADol 50 MG tablet Commonly known as: ULTRAM       TAKE these medications    amLODipine 5 MG tablet Commonly known as: NORVASC Take 5 mg by mouth daily.   aspirin EC 81 MG tablet Take 1 tablet (81 mg total) by mouth 2 (two) times daily.  CITRACAL +D3 PO Take 2 tablets by mouth daily.   clonazePAM 0.5 MG tablet Commonly known as: KLONOPIN Take 1 mg by mouth at bedtime.   HAIR/SKIN/NAILS/BIOTIN PO Take 3 each by mouth daily.   oxyCODONE-acetaminophen 5-325 MG tablet Commonly known as: PERCOCET/ROXICET Take 1 tablet by mouth every 4 (four) hours as needed for severe pain.   Salonpas 3.10-10-08 % Ptch Generic drug: Camphor-Menthol-Methyl Sal Apply 1 patch topically daily as needed (pain). With Lidocaine   thyroid 60 MG tablet Commonly known as: ARMOUR Take 60 mg by mouth daily before breakfast.   tiZANidine 2 MG tablet Commonly known as: ZANAFLEX Take 1 tablet (2 mg total) by mouth every 6 (six) hours as needed.               Durable Medical Equipment  (From admission, onward)           Start     Ordered   09/30/21 1837  DME Walker rolling  Once       Question:  Patient needs a walker to treat with the following condition  Answer:  Status post total hip replacement,  left   09/30/21 1836   09/30/21 1837  DME 3 n 1  Once        09/30/21 1836              Discharge Care Instructions  (From admission, onward)           Start     Ordered   10/03/21 0000  Weight bearing as tolerated        10/03/21 0719            Diagnostic Studies: DG Hip Port Unilat With Pelvis 1V Left  Result Date: 09/30/2021 CLINICAL DATA:  Postop EXAM: DG HIP (WITH OR WITHOUT PELVIS) 1V PORT LEFT COMPARISON:  CT 12/09/2010, fluoroscopic image 02/25/2010 FINDINGS: Status post left hip replacement with intact hardware and normal alignment. A focal cortical defect laterally at the subtrochanteric femur is presumably related to previous hardware placement. Punctate metallic densities are noted in the region. Gas in the soft tissues consistent with recent surgery. IMPRESSION: Left hip replacement with expected postsurgical change. Cortical defect laterally of the subtrochanteric femur presumably corresponds to previous hardware. Electronically Signed   By: Donavan Foil M.D.   On: 09/30/2021 20:06    Disposition: Discharge disposition: 01-Home or Self Care       Discharge Instructions     Call MD / Call 911   Complete by: As directed    If you experience chest pain or shortness of breath, CALL 911 and be transported to the hospital emergency room.  If you develope a fever above 101 F, pus (white drainage) or increased drainage or redness at the wound, or calf pain, call your surgeon's office.   Constipation Prevention   Complete by: As directed    Drink plenty of fluids.  Prune juice may be helpful.  You may use a stool softener, such as Colace (over the counter) 100 mg twice a day.  Use MiraLax (over the counter) for constipation as needed.   Diet - low sodium heart healthy   Complete by: As directed    Driving restrictions   Complete by: As directed    No driving for 2 weeks   Increase activity slowly as tolerated   Complete by: As directed    Patient may  shower   Complete by: As directed    You may shower without a  dressing once there is no drainage.  Do not wash over the wound.  If drainage remains, cover wound with plastic wrap and then shower.   Post-operative opioid taper instructions:   Complete by: As directed    POST-OPERATIVE OPIOID TAPER INSTRUCTIONS: It is important to wean off of your opioid medication as soon as possible. If you do not need pain medication after your surgery it is ok to stop day one. Opioids include: Codeine, Hydrocodone(Norco, Vicodin), Oxycodone(Percocet, oxycontin) and hydromorphone amongst others.  Long term and even short term use of opiods can cause: Increased pain response Dependence Constipation Depression Respiratory depression And more.  Withdrawal symptoms can include Flu like symptoms Nausea, vomiting And more Techniques to manage these symptoms Hydrate well Eat regular healthy meals Stay active Use relaxation techniques(deep breathing, meditating, yoga) Do Not substitute Alcohol to help with tapering If you have been on opioids for less than two weeks and do not have pain than it is ok to stop all together.  Plan to wean off of opioids This plan should start within one week post op of your joint replacement. Maintain the same interval or time between taking each dose and first decrease the dose.  Cut the total daily intake of opioids by one tablet each day Next start to increase the time between doses. The last dose that should be eliminated is the evening dose.      Weight bearing as tolerated   Complete by: As directed         Follow-up Information     Frederik Pear, MD Follow up in 2 week(s).   Specialty: Orthopedic Surgery Contact information: [place] [address] [phone]         [facility] [place] Follow up.   Specialty: [place] Why: PT Contact information: [address] [place] [address] [phone]                   Signed: Joanell Rising 10/03/2021, 7:19 AM

## 2021-10-03 NOTE — Progress Notes (Signed)
PATIENT ID: Tiffany Fox  MRN: [id number]  DOB/AGE:  Jun 22, 1934 / 87 y.o.  3 Days Post-Op Procedure(s) (LRB): LEFT TOTAL HIP REVISION POSTERIOR AND NAIL REMOVAL (Left)    PROGRESS NOTE Subjective: Patient is alert, oriented, no Nausea, no Vomiting, yes passing gas, . Taking PO well. Denies SOB, Chest or Calf Pain. Using Incentive Spirometer, PAS in place. Ambulate WBAT with posterior hip precautions.  Pt walked 20 ft with therapy and practiced steps Patient reports pain as  moderate .    Objective: Vital signs in last 24 hours: Vitals:   10/02/21 1343 10/02/21 2102 10/02/21 2243 10/03/21 0706  BP: (!) 112/56 115/60 (!) 151/73 (!) 145/85  Pulse: 79 86 99 (!) 104  Resp: 18 16 18 16   Temp: 98.3 F (36.8 C) 98.2 F (36.8 C) 98.1 F (36.7 C) 98.3 F (36.8 C)  TempSrc: Oral  Oral   SpO2: 96% 96% 97% 97%  Weight:      Height:          Intake/Output from previous day: I/O last 3 completed shifts: In: -  Out: 2950 [Urine:2950]   Intake/Output this shift: No intake/output data recorded.   LABORATORY DATA: Recent Labs    10/01/21 0322 10/02/21 0318 10/03/21 0314  WBC 5.3 5.5 4.8  HGB 9.6* 8.7* 8.8*  HCT 29.7* 26.5* 26.3*  PLT 184 174 186  NA 134*  --   --   K 4.4  --   --   CL 108  --   --   CO2 23  --   --   BUN 13  --   --   CREATININE 0.80  --   --   GLUCOSE 251*  --   --   CALCIUM 8.0*  --   --     Examination: Neurologically intact Neurovascular intact Sensation intact distally Intact pulses distally Dorsiflexion/Plantar flexion intact Incision: dressing C/D/I and no drainage No cellulitis present Compartment soft} XR AP&Lat of hip shows well placed\fixed THA  Assessment:   3 Days Post-Op Procedure(s) (LRB): LEFT TOTAL HIP REVISION POSTERIOR AND NAIL REMOVAL (Left) ADDITIONAL DIAGNOSIS:  Expected Acute Blood Loss Anemia, Leukopenia  Plan: PT/OT WBAT, THA posterior hip precautions  DVT Prophylaxis: SCDx72 hrs, ASA 81 mg BID x 2  weeks  DISCHARGE PLAN: Home later today  DISCHARGE NEEDS: HHPT, Walker, and 3-in-1 comode seat

## 2021-10-07 ENCOUNTER — Encounter (HOSPITAL_COMMUNITY): Payer: Self-pay | Admitting: Orthopedic Surgery

## 2021-10-09 DIAGNOSIS — M1612 Unilateral primary osteoarthritis, left hip: Secondary | ICD-10-CM | POA: Diagnosis not present

## 2021-10-13 ENCOUNTER — Ambulatory Visit (HOSPITAL_BASED_OUTPATIENT_CLINIC_OR_DEPARTMENT_OTHER): Payer: Medicare Other | Admitting: Cardiology

## 2021-10-14 DIAGNOSIS — E039 Hypothyroidism, unspecified: Secondary | ICD-10-CM | POA: Diagnosis not present

## 2021-10-14 DIAGNOSIS — K5900 Constipation, unspecified: Secondary | ICD-10-CM | POA: Diagnosis not present

## 2021-10-14 DIAGNOSIS — I10 Essential (primary) hypertension: Secondary | ICD-10-CM | POA: Diagnosis not present

## 2021-10-14 DIAGNOSIS — M1990 Unspecified osteoarthritis, unspecified site: Secondary | ICD-10-CM | POA: Diagnosis not present

## 2021-10-14 DIAGNOSIS — M810 Age-related osteoporosis without current pathological fracture: Secondary | ICD-10-CM | POA: Diagnosis not present

## 2021-10-14 DIAGNOSIS — T8484XD Pain due to internal orthopedic prosthetic devices, implants and grafts, subsequent encounter: Secondary | ICD-10-CM | POA: Diagnosis not present

## 2021-10-14 DIAGNOSIS — F419 Anxiety disorder, unspecified: Secondary | ICD-10-CM | POA: Diagnosis not present

## 2021-10-14 DIAGNOSIS — R197 Diarrhea, unspecified: Secondary | ICD-10-CM | POA: Diagnosis not present

## 2021-10-14 DIAGNOSIS — F32A Depression, unspecified: Secondary | ICD-10-CM | POA: Diagnosis not present

## 2021-10-14 DIAGNOSIS — M47812 Spondylosis without myelopathy or radiculopathy, cervical region: Secondary | ICD-10-CM | POA: Diagnosis not present

## 2021-10-14 DIAGNOSIS — Z9181 History of falling: Secondary | ICD-10-CM | POA: Diagnosis not present

## 2021-10-17 DIAGNOSIS — T8484XD Pain due to internal orthopedic prosthetic devices, implants and grafts, subsequent encounter: Secondary | ICD-10-CM | POA: Diagnosis not present

## 2021-10-17 DIAGNOSIS — M810 Age-related osteoporosis without current pathological fracture: Secondary | ICD-10-CM | POA: Diagnosis not present

## 2021-10-17 DIAGNOSIS — F419 Anxiety disorder, unspecified: Secondary | ICD-10-CM | POA: Diagnosis not present

## 2021-10-17 DIAGNOSIS — F32A Depression, unspecified: Secondary | ICD-10-CM | POA: Diagnosis not present

## 2021-10-17 DIAGNOSIS — I10 Essential (primary) hypertension: Secondary | ICD-10-CM | POA: Diagnosis not present

## 2021-10-17 DIAGNOSIS — E039 Hypothyroidism, unspecified: Secondary | ICD-10-CM | POA: Diagnosis not present

## 2021-10-20 DIAGNOSIS — M810 Age-related osteoporosis without current pathological fracture: Secondary | ICD-10-CM | POA: Diagnosis not present

## 2021-10-20 DIAGNOSIS — T8484XD Pain due to internal orthopedic prosthetic devices, implants and grafts, subsequent encounter: Secondary | ICD-10-CM | POA: Diagnosis not present

## 2021-10-20 DIAGNOSIS — F419 Anxiety disorder, unspecified: Secondary | ICD-10-CM | POA: Diagnosis not present

## 2021-10-20 DIAGNOSIS — E039 Hypothyroidism, unspecified: Secondary | ICD-10-CM | POA: Diagnosis not present

## 2021-10-20 DIAGNOSIS — F32A Depression, unspecified: Secondary | ICD-10-CM | POA: Diagnosis not present

## 2021-10-20 DIAGNOSIS — I10 Essential (primary) hypertension: Secondary | ICD-10-CM | POA: Diagnosis not present

## 2021-10-22 DIAGNOSIS — I10 Essential (primary) hypertension: Secondary | ICD-10-CM | POA: Diagnosis not present

## 2021-10-22 DIAGNOSIS — M810 Age-related osteoporosis without current pathological fracture: Secondary | ICD-10-CM | POA: Diagnosis not present

## 2021-10-22 DIAGNOSIS — F32A Depression, unspecified: Secondary | ICD-10-CM | POA: Diagnosis not present

## 2021-10-22 DIAGNOSIS — F419 Anxiety disorder, unspecified: Secondary | ICD-10-CM | POA: Diagnosis not present

## 2021-10-22 DIAGNOSIS — T8484XD Pain due to internal orthopedic prosthetic devices, implants and grafts, subsequent encounter: Secondary | ICD-10-CM | POA: Diagnosis not present

## 2021-10-22 DIAGNOSIS — E039 Hypothyroidism, unspecified: Secondary | ICD-10-CM | POA: Diagnosis not present

## 2021-10-23 DIAGNOSIS — T8484XD Pain due to internal orthopedic prosthetic devices, implants and grafts, subsequent encounter: Secondary | ICD-10-CM | POA: Diagnosis not present

## 2021-10-23 DIAGNOSIS — F419 Anxiety disorder, unspecified: Secondary | ICD-10-CM | POA: Diagnosis not present

## 2021-10-23 DIAGNOSIS — I10 Essential (primary) hypertension: Secondary | ICD-10-CM | POA: Diagnosis not present

## 2021-10-23 DIAGNOSIS — M810 Age-related osteoporosis without current pathological fracture: Secondary | ICD-10-CM | POA: Diagnosis not present

## 2021-10-23 DIAGNOSIS — E039 Hypothyroidism, unspecified: Secondary | ICD-10-CM | POA: Diagnosis not present

## 2021-10-23 DIAGNOSIS — F32A Depression, unspecified: Secondary | ICD-10-CM | POA: Diagnosis not present

## 2021-10-27 DIAGNOSIS — M810 Age-related osteoporosis without current pathological fracture: Secondary | ICD-10-CM | POA: Diagnosis not present

## 2021-10-27 DIAGNOSIS — I10 Essential (primary) hypertension: Secondary | ICD-10-CM | POA: Diagnosis not present

## 2021-10-27 DIAGNOSIS — T8484XD Pain due to internal orthopedic prosthetic devices, implants and grafts, subsequent encounter: Secondary | ICD-10-CM | POA: Diagnosis not present

## 2021-10-27 DIAGNOSIS — F419 Anxiety disorder, unspecified: Secondary | ICD-10-CM | POA: Diagnosis not present

## 2021-10-27 DIAGNOSIS — E039 Hypothyroidism, unspecified: Secondary | ICD-10-CM | POA: Diagnosis not present

## 2021-10-27 DIAGNOSIS — F32A Depression, unspecified: Secondary | ICD-10-CM | POA: Diagnosis not present

## 2021-10-29 DIAGNOSIS — M810 Age-related osteoporosis without current pathological fracture: Secondary | ICD-10-CM | POA: Diagnosis not present

## 2021-10-29 DIAGNOSIS — F419 Anxiety disorder, unspecified: Secondary | ICD-10-CM | POA: Diagnosis not present

## 2021-10-29 DIAGNOSIS — E039 Hypothyroidism, unspecified: Secondary | ICD-10-CM | POA: Diagnosis not present

## 2021-10-29 DIAGNOSIS — T8484XD Pain due to internal orthopedic prosthetic devices, implants and grafts, subsequent encounter: Secondary | ICD-10-CM | POA: Diagnosis not present

## 2021-10-29 DIAGNOSIS — I10 Essential (primary) hypertension: Secondary | ICD-10-CM | POA: Diagnosis not present

## 2021-10-29 DIAGNOSIS — F32A Depression, unspecified: Secondary | ICD-10-CM | POA: Diagnosis not present

## 2021-10-31 DIAGNOSIS — F419 Anxiety disorder, unspecified: Secondary | ICD-10-CM | POA: Diagnosis not present

## 2021-10-31 DIAGNOSIS — I10 Essential (primary) hypertension: Secondary | ICD-10-CM | POA: Diagnosis not present

## 2021-10-31 DIAGNOSIS — T8484XD Pain due to internal orthopedic prosthetic devices, implants and grafts, subsequent encounter: Secondary | ICD-10-CM | POA: Diagnosis not present

## 2021-10-31 DIAGNOSIS — M810 Age-related osteoporosis without current pathological fracture: Secondary | ICD-10-CM | POA: Diagnosis not present

## 2021-10-31 DIAGNOSIS — F32A Depression, unspecified: Secondary | ICD-10-CM | POA: Diagnosis not present

## 2021-10-31 DIAGNOSIS — E039 Hypothyroidism, unspecified: Secondary | ICD-10-CM | POA: Diagnosis not present

## 2021-11-17 DIAGNOSIS — Z96642 Presence of left artificial hip joint: Secondary | ICD-10-CM | POA: Diagnosis not present

## 2021-11-17 DIAGNOSIS — M25652 Stiffness of left hip, not elsewhere classified: Secondary | ICD-10-CM | POA: Diagnosis not present

## 2021-11-18 DIAGNOSIS — Z96642 Presence of left artificial hip joint: Secondary | ICD-10-CM | POA: Diagnosis not present

## 2021-11-18 DIAGNOSIS — M25652 Stiffness of left hip, not elsewhere classified: Secondary | ICD-10-CM | POA: Diagnosis not present

## 2021-11-20 DIAGNOSIS — M25652 Stiffness of left hip, not elsewhere classified: Secondary | ICD-10-CM | POA: Diagnosis not present

## 2021-11-20 DIAGNOSIS — Z96642 Presence of left artificial hip joint: Secondary | ICD-10-CM | POA: Diagnosis not present

## 2021-11-24 ENCOUNTER — Other Ambulatory Visit (HOSPITAL_BASED_OUTPATIENT_CLINIC_OR_DEPARTMENT_OTHER): Payer: Self-pay | Admitting: Cardiology

## 2021-11-24 DIAGNOSIS — M25652 Stiffness of left hip, not elsewhere classified: Secondary | ICD-10-CM | POA: Diagnosis not present

## 2021-11-24 DIAGNOSIS — M25562 Pain in left knee: Secondary | ICD-10-CM | POA: Diagnosis not present

## 2021-11-24 DIAGNOSIS — Z9889 Other specified postprocedural states: Secondary | ICD-10-CM | POA: Diagnosis not present

## 2021-11-24 NOTE — Telephone Encounter (Signed)
Rx(s) sent to pharmacy electronically.  

## 2021-12-02 DIAGNOSIS — M25562 Pain in left knee: Secondary | ICD-10-CM | POA: Diagnosis not present

## 2021-12-02 DIAGNOSIS — M1712 Unilateral primary osteoarthritis, left knee: Secondary | ICD-10-CM | POA: Diagnosis not present

## 2021-12-06 DIAGNOSIS — M25562 Pain in left knee: Secondary | ICD-10-CM | POA: Diagnosis not present

## 2021-12-11 DIAGNOSIS — M25562 Pain in left knee: Secondary | ICD-10-CM | POA: Diagnosis not present

## 2021-12-11 DIAGNOSIS — M545 Low back pain, unspecified: Secondary | ICD-10-CM | POA: Diagnosis not present

## 2021-12-23 DIAGNOSIS — Z96642 Presence of left artificial hip joint: Secondary | ICD-10-CM | POA: Diagnosis not present

## 2021-12-23 DIAGNOSIS — R5381 Other malaise: Secondary | ICD-10-CM | POA: Diagnosis not present

## 2021-12-23 DIAGNOSIS — F322 Major depressive disorder, single episode, severe without psychotic features: Secondary | ICD-10-CM | POA: Diagnosis not present

## 2021-12-23 DIAGNOSIS — E039 Hypothyroidism, unspecified: Secondary | ICD-10-CM | POA: Diagnosis not present

## 2021-12-23 DIAGNOSIS — I10 Essential (primary) hypertension: Secondary | ICD-10-CM | POA: Diagnosis not present

## 2021-12-23 DIAGNOSIS — R627 Adult failure to thrive: Secondary | ICD-10-CM | POA: Diagnosis not present

## 2021-12-25 DIAGNOSIS — M25552 Pain in left hip: Secondary | ICD-10-CM | POA: Diagnosis not present

## 2021-12-25 DIAGNOSIS — M545 Low back pain, unspecified: Secondary | ICD-10-CM | POA: Diagnosis not present

## 2022-01-22 DIAGNOSIS — M545 Low back pain, unspecified: Secondary | ICD-10-CM | POA: Diagnosis not present

## 2022-01-22 DIAGNOSIS — S39012A Strain of muscle, fascia and tendon of lower back, initial encounter: Secondary | ICD-10-CM | POA: Diagnosis not present

## 2022-01-28 DIAGNOSIS — Z9181 History of falling: Secondary | ICD-10-CM | POA: Diagnosis not present

## 2022-01-28 DIAGNOSIS — M810 Age-related osteoporosis without current pathological fracture: Secondary | ICD-10-CM | POA: Diagnosis not present

## 2022-01-28 DIAGNOSIS — I10 Essential (primary) hypertension: Secondary | ICD-10-CM | POA: Diagnosis not present

## 2022-01-28 DIAGNOSIS — Z8781 Personal history of (healed) traumatic fracture: Secondary | ICD-10-CM | POA: Diagnosis not present

## 2022-01-28 DIAGNOSIS — M4186 Other forms of scoliosis, lumbar region: Secondary | ICD-10-CM | POA: Diagnosis not present

## 2022-01-28 DIAGNOSIS — Z96642 Presence of left artificial hip joint: Secondary | ICD-10-CM | POA: Diagnosis not present

## 2022-01-28 DIAGNOSIS — M47812 Spondylosis without myelopathy or radiculopathy, cervical region: Secondary | ICD-10-CM | POA: Diagnosis not present

## 2022-01-28 DIAGNOSIS — F419 Anxiety disorder, unspecified: Secondary | ICD-10-CM | POA: Diagnosis not present

## 2022-01-28 DIAGNOSIS — E039 Hypothyroidism, unspecified: Secondary | ICD-10-CM | POA: Diagnosis not present

## 2022-01-28 DIAGNOSIS — F32A Depression, unspecified: Secondary | ICD-10-CM | POA: Diagnosis not present

## 2022-01-28 DIAGNOSIS — M25552 Pain in left hip: Secondary | ICD-10-CM | POA: Diagnosis not present

## 2022-02-03 DIAGNOSIS — M4186 Other forms of scoliosis, lumbar region: Secondary | ICD-10-CM | POA: Diagnosis not present

## 2022-02-03 DIAGNOSIS — M47812 Spondylosis without myelopathy or radiculopathy, cervical region: Secondary | ICD-10-CM | POA: Diagnosis not present

## 2022-02-03 DIAGNOSIS — E039 Hypothyroidism, unspecified: Secondary | ICD-10-CM | POA: Diagnosis not present

## 2022-02-03 DIAGNOSIS — M810 Age-related osteoporosis without current pathological fracture: Secondary | ICD-10-CM | POA: Diagnosis not present

## 2022-02-03 DIAGNOSIS — I10 Essential (primary) hypertension: Secondary | ICD-10-CM | POA: Diagnosis not present

## 2022-02-03 DIAGNOSIS — M25552 Pain in left hip: Secondary | ICD-10-CM | POA: Diagnosis not present

## 2022-02-05 DIAGNOSIS — M25552 Pain in left hip: Secondary | ICD-10-CM | POA: Diagnosis not present

## 2022-02-05 DIAGNOSIS — M810 Age-related osteoporosis without current pathological fracture: Secondary | ICD-10-CM | POA: Diagnosis not present

## 2022-02-05 DIAGNOSIS — I10 Essential (primary) hypertension: Secondary | ICD-10-CM | POA: Diagnosis not present

## 2022-02-05 DIAGNOSIS — E039 Hypothyroidism, unspecified: Secondary | ICD-10-CM | POA: Diagnosis not present

## 2022-02-05 DIAGNOSIS — M4186 Other forms of scoliosis, lumbar region: Secondary | ICD-10-CM | POA: Diagnosis not present

## 2022-02-05 DIAGNOSIS — M47812 Spondylosis without myelopathy or radiculopathy, cervical region: Secondary | ICD-10-CM | POA: Diagnosis not present

## 2022-02-09 DIAGNOSIS — E039 Hypothyroidism, unspecified: Secondary | ICD-10-CM | POA: Diagnosis not present

## 2022-02-09 DIAGNOSIS — M25552 Pain in left hip: Secondary | ICD-10-CM | POA: Diagnosis not present

## 2022-02-09 DIAGNOSIS — M47812 Spondylosis without myelopathy or radiculopathy, cervical region: Secondary | ICD-10-CM | POA: Diagnosis not present

## 2022-02-09 DIAGNOSIS — M4186 Other forms of scoliosis, lumbar region: Secondary | ICD-10-CM | POA: Diagnosis not present

## 2022-02-09 DIAGNOSIS — I10 Essential (primary) hypertension: Secondary | ICD-10-CM | POA: Diagnosis not present

## 2022-02-09 DIAGNOSIS — M810 Age-related osteoporosis without current pathological fracture: Secondary | ICD-10-CM | POA: Diagnosis not present

## 2022-02-12 DIAGNOSIS — M25552 Pain in left hip: Secondary | ICD-10-CM | POA: Diagnosis not present

## 2022-02-12 DIAGNOSIS — M47812 Spondylosis without myelopathy or radiculopathy, cervical region: Secondary | ICD-10-CM | POA: Diagnosis not present

## 2022-02-12 DIAGNOSIS — M4186 Other forms of scoliosis, lumbar region: Secondary | ICD-10-CM | POA: Diagnosis not present

## 2022-02-12 DIAGNOSIS — M810 Age-related osteoporosis without current pathological fracture: Secondary | ICD-10-CM | POA: Diagnosis not present

## 2022-02-12 DIAGNOSIS — E039 Hypothyroidism, unspecified: Secondary | ICD-10-CM | POA: Diagnosis not present

## 2022-02-12 DIAGNOSIS — I10 Essential (primary) hypertension: Secondary | ICD-10-CM | POA: Diagnosis not present

## 2022-02-17 DIAGNOSIS — M810 Age-related osteoporosis without current pathological fracture: Secondary | ICD-10-CM | POA: Diagnosis not present

## 2022-02-17 DIAGNOSIS — M47812 Spondylosis without myelopathy or radiculopathy, cervical region: Secondary | ICD-10-CM | POA: Diagnosis not present

## 2022-02-17 DIAGNOSIS — M4186 Other forms of scoliosis, lumbar region: Secondary | ICD-10-CM | POA: Diagnosis not present

## 2022-02-17 DIAGNOSIS — I10 Essential (primary) hypertension: Secondary | ICD-10-CM | POA: Diagnosis not present

## 2022-02-17 DIAGNOSIS — M25552 Pain in left hip: Secondary | ICD-10-CM | POA: Diagnosis not present

## 2022-02-17 DIAGNOSIS — F332 Major depressive disorder, recurrent severe without psychotic features: Secondary | ICD-10-CM | POA: Diagnosis not present

## 2022-02-17 DIAGNOSIS — E039 Hypothyroidism, unspecified: Secondary | ICD-10-CM | POA: Diagnosis not present

## 2022-02-23 DIAGNOSIS — E039 Hypothyroidism, unspecified: Secondary | ICD-10-CM | POA: Diagnosis not present

## 2022-02-23 DIAGNOSIS — M810 Age-related osteoporosis without current pathological fracture: Secondary | ICD-10-CM | POA: Diagnosis not present

## 2022-02-23 DIAGNOSIS — M25552 Pain in left hip: Secondary | ICD-10-CM | POA: Diagnosis not present

## 2022-02-23 DIAGNOSIS — I10 Essential (primary) hypertension: Secondary | ICD-10-CM | POA: Diagnosis not present

## 2022-02-23 DIAGNOSIS — M4186 Other forms of scoliosis, lumbar region: Secondary | ICD-10-CM | POA: Diagnosis not present

## 2022-02-23 DIAGNOSIS — M47812 Spondylosis without myelopathy or radiculopathy, cervical region: Secondary | ICD-10-CM | POA: Diagnosis not present

## 2022-02-27 DIAGNOSIS — M4186 Other forms of scoliosis, lumbar region: Secondary | ICD-10-CM | POA: Diagnosis not present

## 2022-02-27 DIAGNOSIS — M47812 Spondylosis without myelopathy or radiculopathy, cervical region: Secondary | ICD-10-CM | POA: Diagnosis not present

## 2022-02-27 DIAGNOSIS — Z8781 Personal history of (healed) traumatic fracture: Secondary | ICD-10-CM | POA: Diagnosis not present

## 2022-02-27 DIAGNOSIS — E039 Hypothyroidism, unspecified: Secondary | ICD-10-CM | POA: Diagnosis not present

## 2022-02-27 DIAGNOSIS — Z96642 Presence of left artificial hip joint: Secondary | ICD-10-CM | POA: Diagnosis not present

## 2022-02-27 DIAGNOSIS — I10 Essential (primary) hypertension: Secondary | ICD-10-CM | POA: Diagnosis not present

## 2022-02-27 DIAGNOSIS — M810 Age-related osteoporosis without current pathological fracture: Secondary | ICD-10-CM | POA: Diagnosis not present

## 2022-02-27 DIAGNOSIS — Z9181 History of falling: Secondary | ICD-10-CM | POA: Diagnosis not present

## 2022-02-27 DIAGNOSIS — M25552 Pain in left hip: Secondary | ICD-10-CM | POA: Diagnosis not present

## 2022-02-27 DIAGNOSIS — F32A Depression, unspecified: Secondary | ICD-10-CM | POA: Diagnosis not present

## 2022-02-27 DIAGNOSIS — F419 Anxiety disorder, unspecified: Secondary | ICD-10-CM | POA: Diagnosis not present

## 2022-03-03 DIAGNOSIS — I10 Essential (primary) hypertension: Secondary | ICD-10-CM | POA: Diagnosis not present

## 2022-03-03 DIAGNOSIS — M4186 Other forms of scoliosis, lumbar region: Secondary | ICD-10-CM | POA: Diagnosis not present

## 2022-03-03 DIAGNOSIS — M47812 Spondylosis without myelopathy or radiculopathy, cervical region: Secondary | ICD-10-CM | POA: Diagnosis not present

## 2022-03-03 DIAGNOSIS — M25552 Pain in left hip: Secondary | ICD-10-CM | POA: Diagnosis not present

## 2022-03-03 DIAGNOSIS — M810 Age-related osteoporosis without current pathological fracture: Secondary | ICD-10-CM | POA: Diagnosis not present

## 2022-03-03 DIAGNOSIS — E039 Hypothyroidism, unspecified: Secondary | ICD-10-CM | POA: Diagnosis not present

## 2022-03-09 DIAGNOSIS — M25552 Pain in left hip: Secondary | ICD-10-CM | POA: Diagnosis not present

## 2022-03-09 DIAGNOSIS — E039 Hypothyroidism, unspecified: Secondary | ICD-10-CM | POA: Diagnosis not present

## 2022-03-09 DIAGNOSIS — I10 Essential (primary) hypertension: Secondary | ICD-10-CM | POA: Diagnosis not present

## 2022-03-09 DIAGNOSIS — M810 Age-related osteoporosis without current pathological fracture: Secondary | ICD-10-CM | POA: Diagnosis not present

## 2022-03-09 DIAGNOSIS — M47812 Spondylosis without myelopathy or radiculopathy, cervical region: Secondary | ICD-10-CM | POA: Diagnosis not present

## 2022-03-09 DIAGNOSIS — M4186 Other forms of scoliosis, lumbar region: Secondary | ICD-10-CM | POA: Diagnosis not present

## 2022-03-17 DIAGNOSIS — M47812 Spondylosis without myelopathy or radiculopathy, cervical region: Secondary | ICD-10-CM | POA: Diagnosis not present

## 2022-03-17 DIAGNOSIS — I10 Essential (primary) hypertension: Secondary | ICD-10-CM | POA: Diagnosis not present

## 2022-03-17 DIAGNOSIS — E039 Hypothyroidism, unspecified: Secondary | ICD-10-CM | POA: Diagnosis not present

## 2022-03-17 DIAGNOSIS — M810 Age-related osteoporosis without current pathological fracture: Secondary | ICD-10-CM | POA: Diagnosis not present

## 2022-03-17 DIAGNOSIS — M25552 Pain in left hip: Secondary | ICD-10-CM | POA: Diagnosis not present

## 2022-03-17 DIAGNOSIS — M4186 Other forms of scoliosis, lumbar region: Secondary | ICD-10-CM | POA: Diagnosis not present

## 2022-03-18 DIAGNOSIS — M21621 Bunionette of right foot: Secondary | ICD-10-CM | POA: Diagnosis not present

## 2022-03-18 DIAGNOSIS — D2371 Other benign neoplasm of skin of right lower limb, including hip: Secondary | ICD-10-CM | POA: Diagnosis not present

## 2022-03-23 DIAGNOSIS — F332 Major depressive disorder, recurrent severe without psychotic features: Secondary | ICD-10-CM | POA: Diagnosis not present

## 2022-03-24 DIAGNOSIS — E039 Hypothyroidism, unspecified: Secondary | ICD-10-CM | POA: Diagnosis not present

## 2022-03-24 DIAGNOSIS — M25552 Pain in left hip: Secondary | ICD-10-CM | POA: Diagnosis not present

## 2022-03-24 DIAGNOSIS — M47812 Spondylosis without myelopathy or radiculopathy, cervical region: Secondary | ICD-10-CM | POA: Diagnosis not present

## 2022-03-24 DIAGNOSIS — I10 Essential (primary) hypertension: Secondary | ICD-10-CM | POA: Diagnosis not present

## 2022-03-24 DIAGNOSIS — M810 Age-related osteoporosis without current pathological fracture: Secondary | ICD-10-CM | POA: Diagnosis not present

## 2022-03-24 DIAGNOSIS — M4186 Other forms of scoliosis, lumbar region: Secondary | ICD-10-CM | POA: Diagnosis not present

## 2022-03-28 IMAGING — DX DG SHOULDER 2+V*R*
1 series · 3 of 3 positions shown · non-contrast
Comparison: None.

CLINICAL DATA: Fall, broken glass, evaluate for foreign body

EXAM:
RIGHT SHOULDER - 2+ VIEW

[Series 1: shoulder · 0.14mm/px · 3 of 3 slices shown]
[im 1/3]
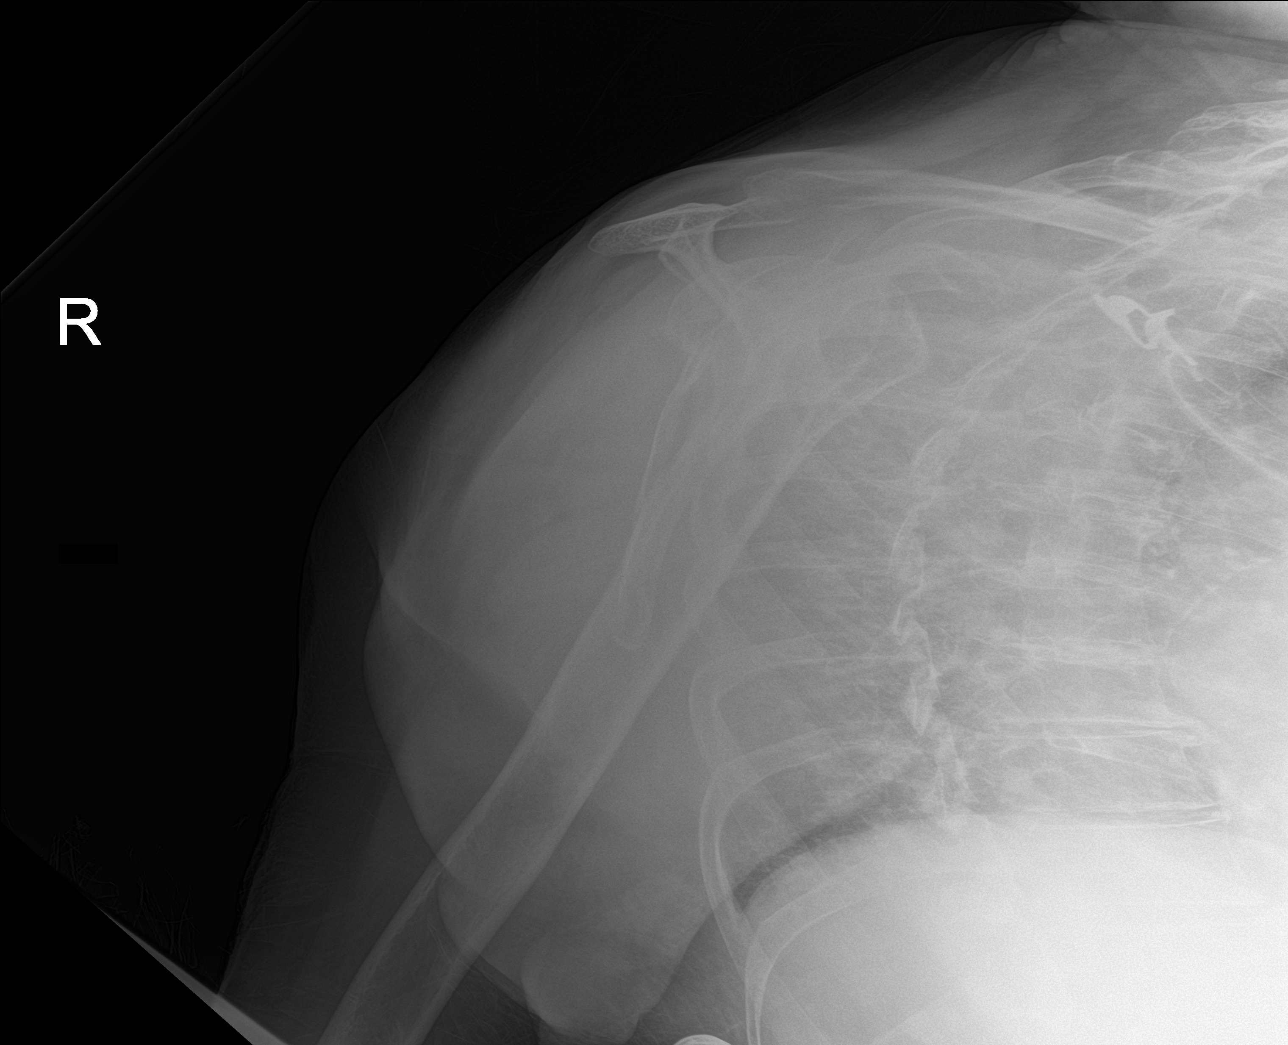
[im 2/3]
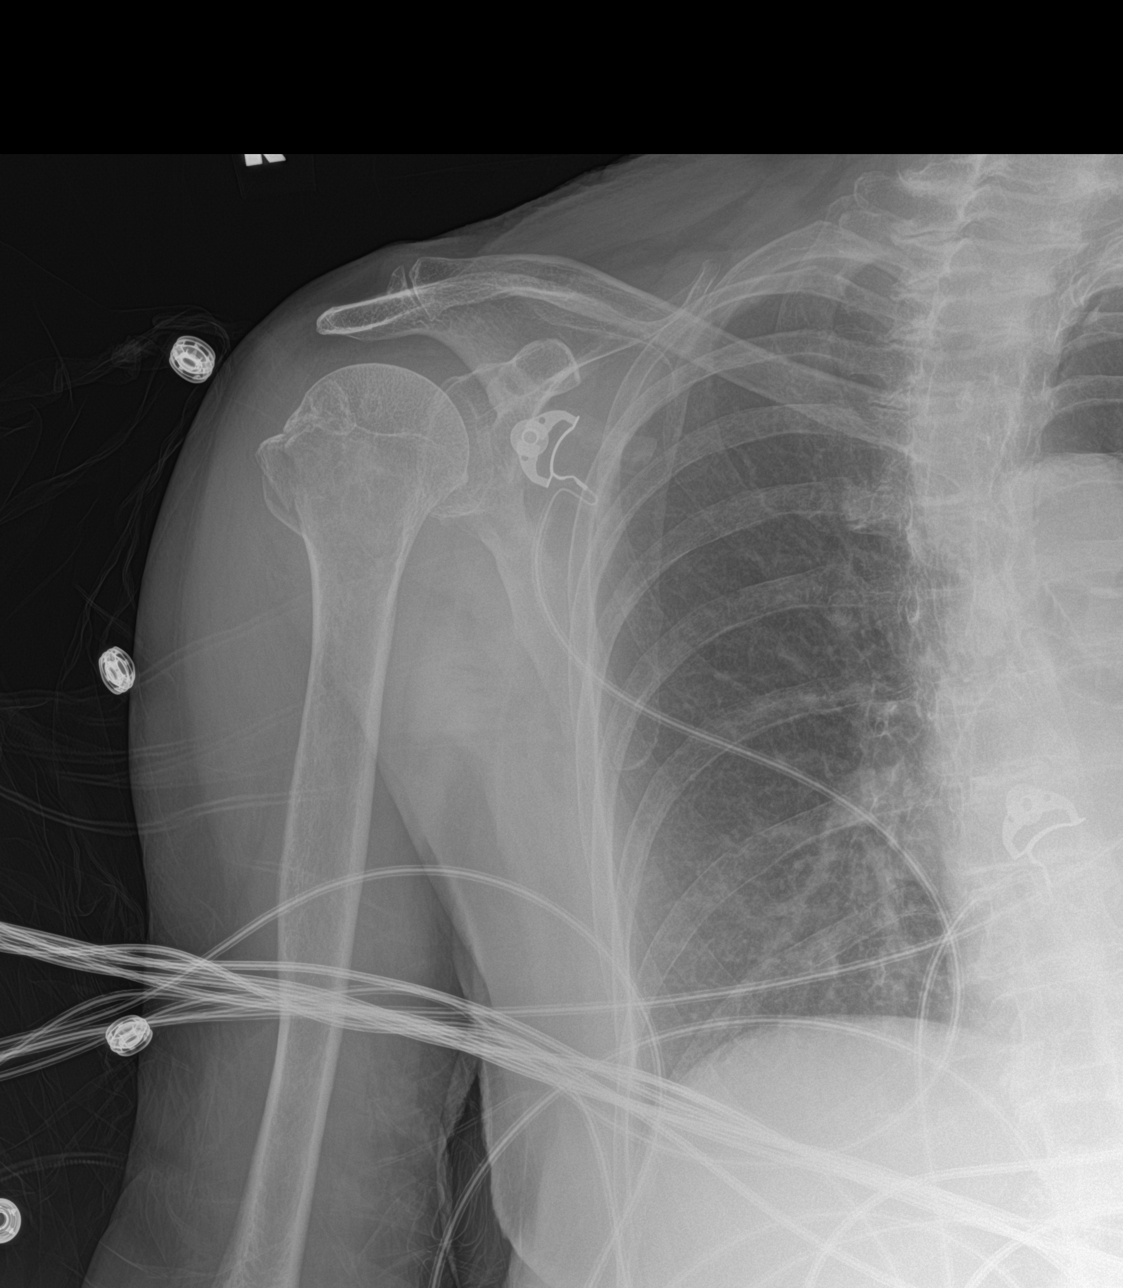
[im 3/3]
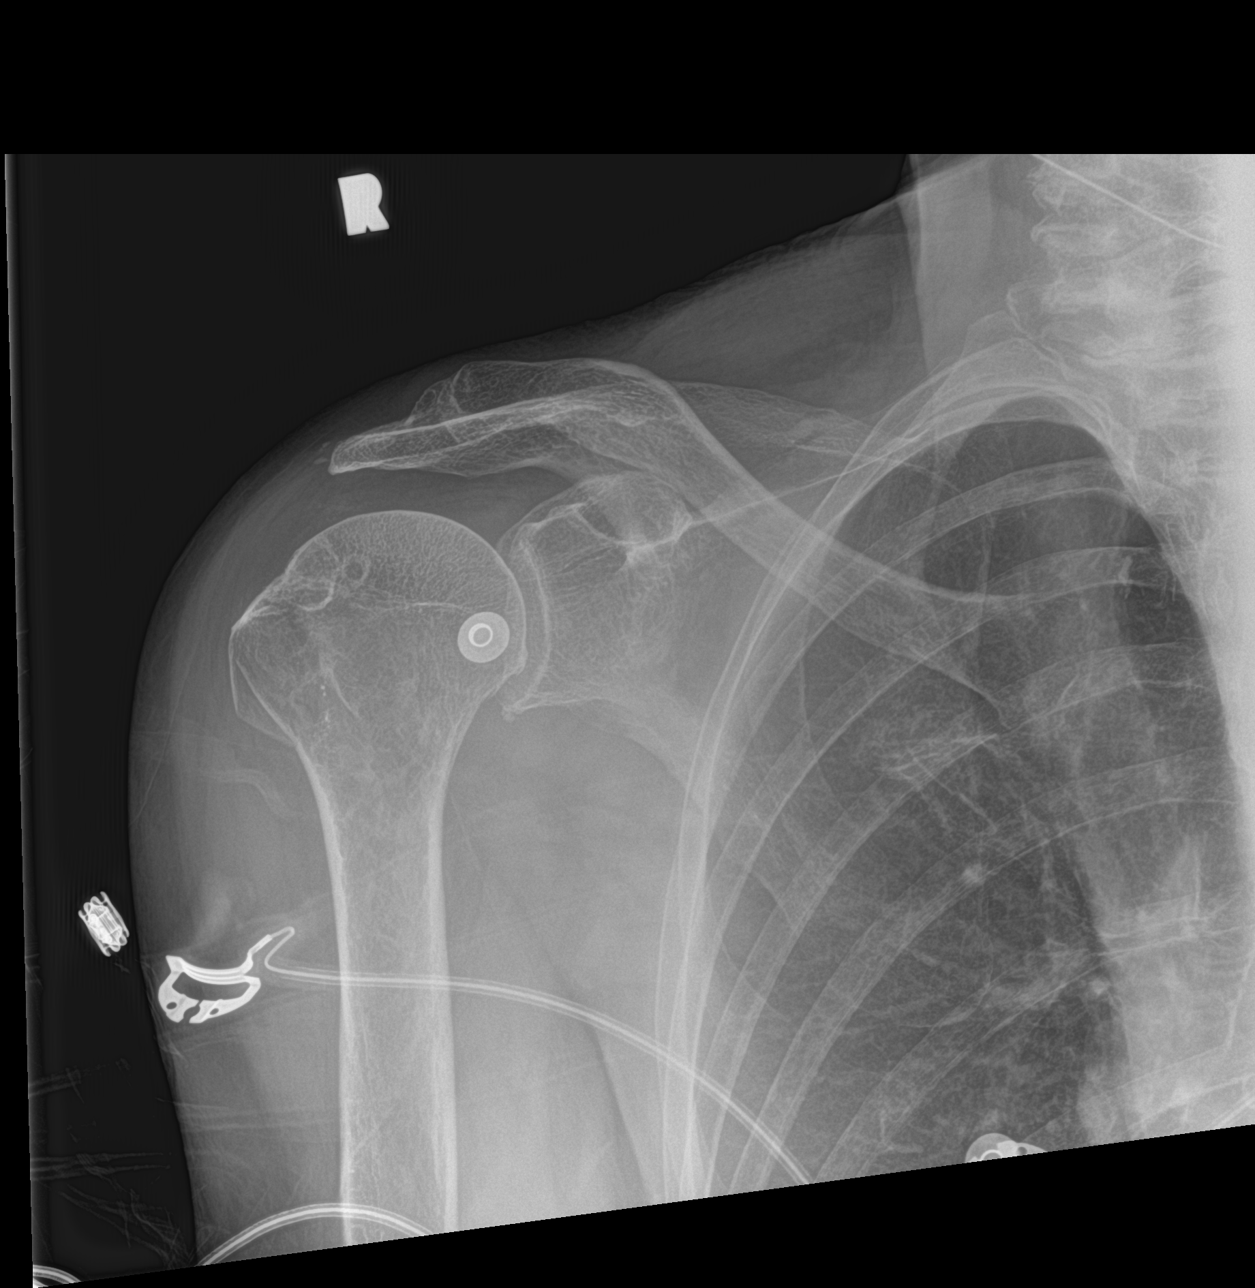

[3 of 3 positions shown; findings below may reference images not displayed]

FINDINGS: Mildly comminuted fracture along the lateral aspect of the humeral
head/greater tuberosity.

Mild degenerative changes of the acromioclavicular joint.
Glenohumeral joint is preserved.

Visualized soft tissues are within normal limits.

Visualized right lung is clear.

No radiopaque foreign body is seen.
IMPRESSION: Mildly comminuted fracture along the greater tuberosity.

## 2022-03-28 IMAGING — DX DG HUMERUS 2V *R*
1 series · 2 of 2 positions shown · non-contrast
Comparison: None.

CLINICAL DATA: Fall, broken glass, evaluate for foreign body

EXAM:
RIGHT HUMERUS - 2+ VIEW

[Series 1: humerus · 0.14mm/px · 2 of 2 slices shown]
[im 1/2]
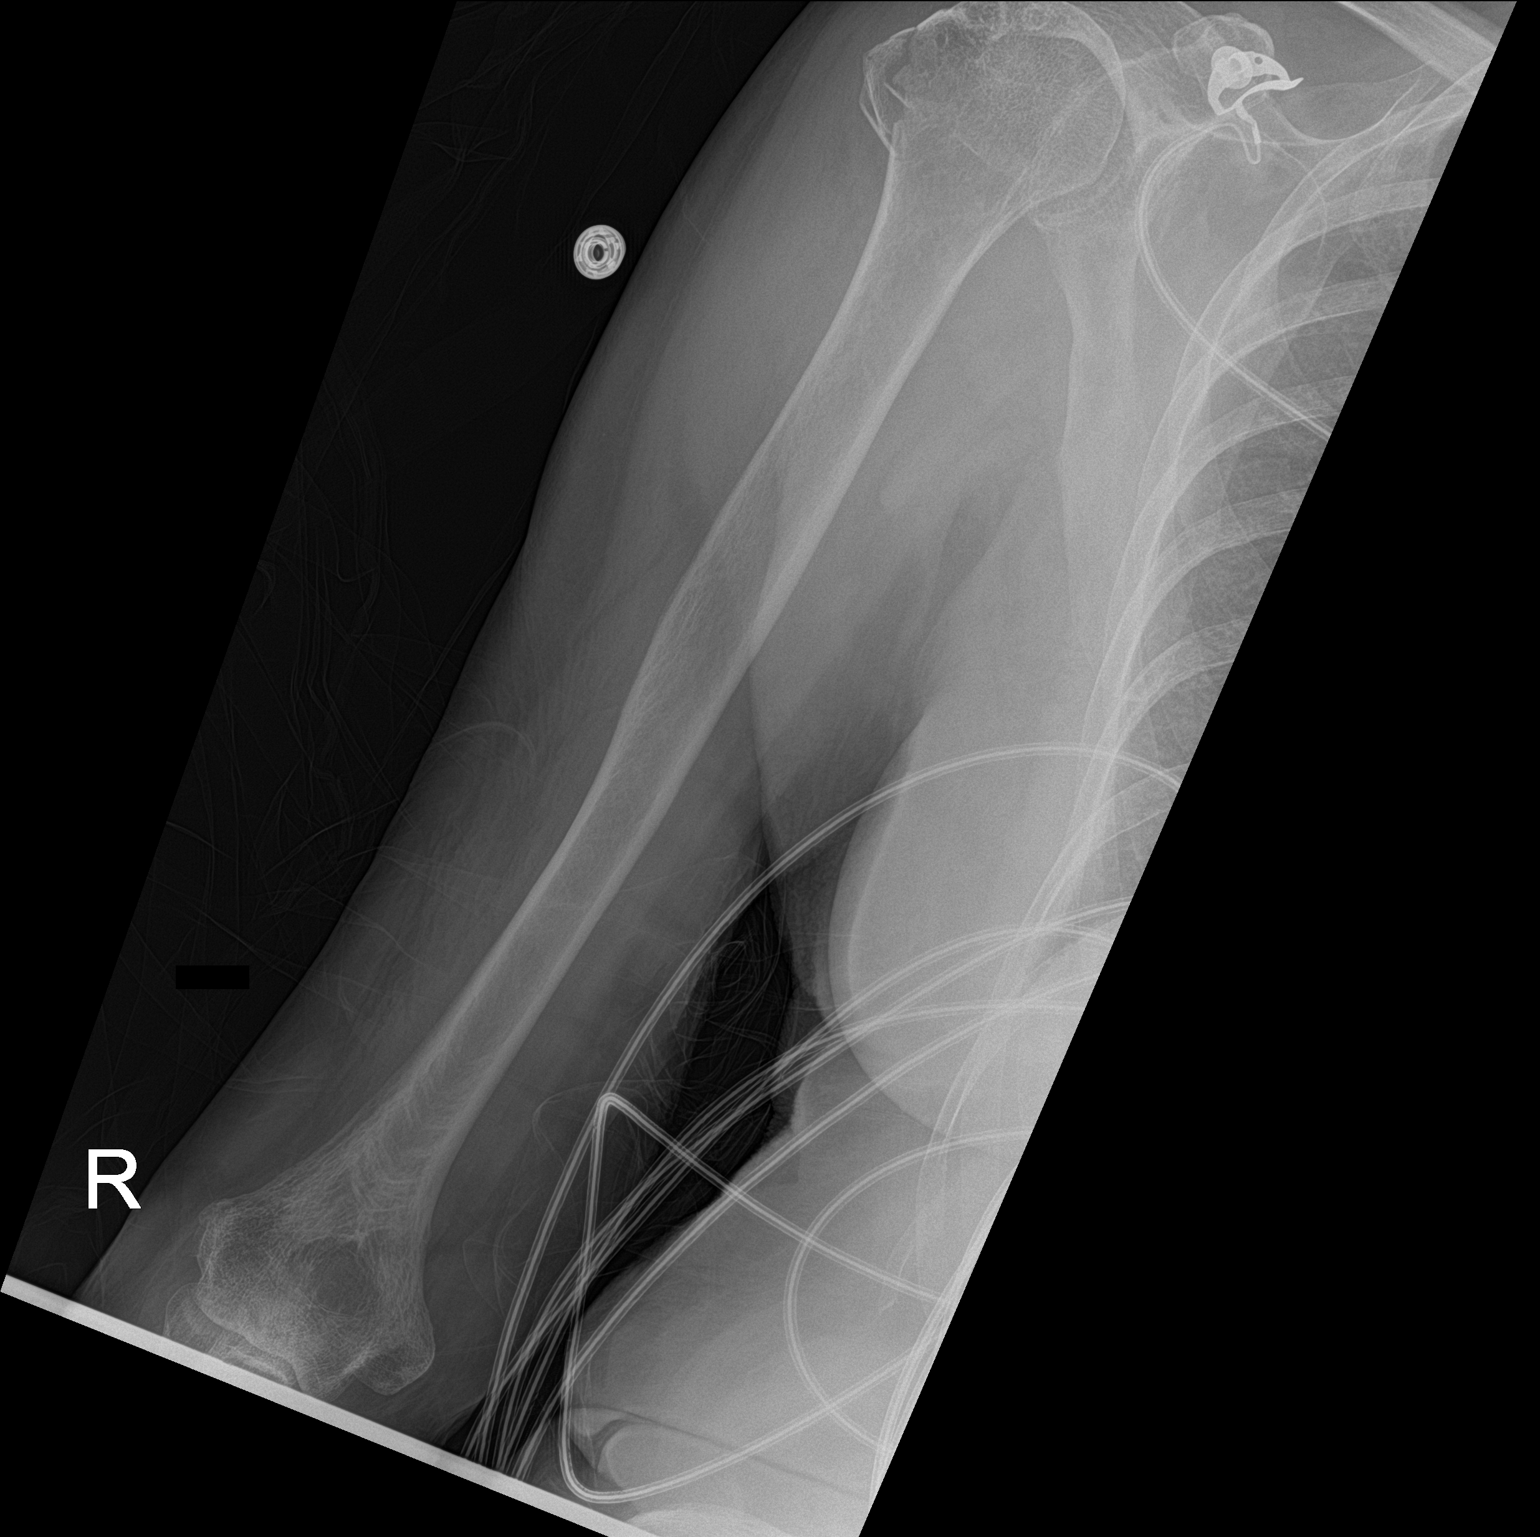
[im 2/2]
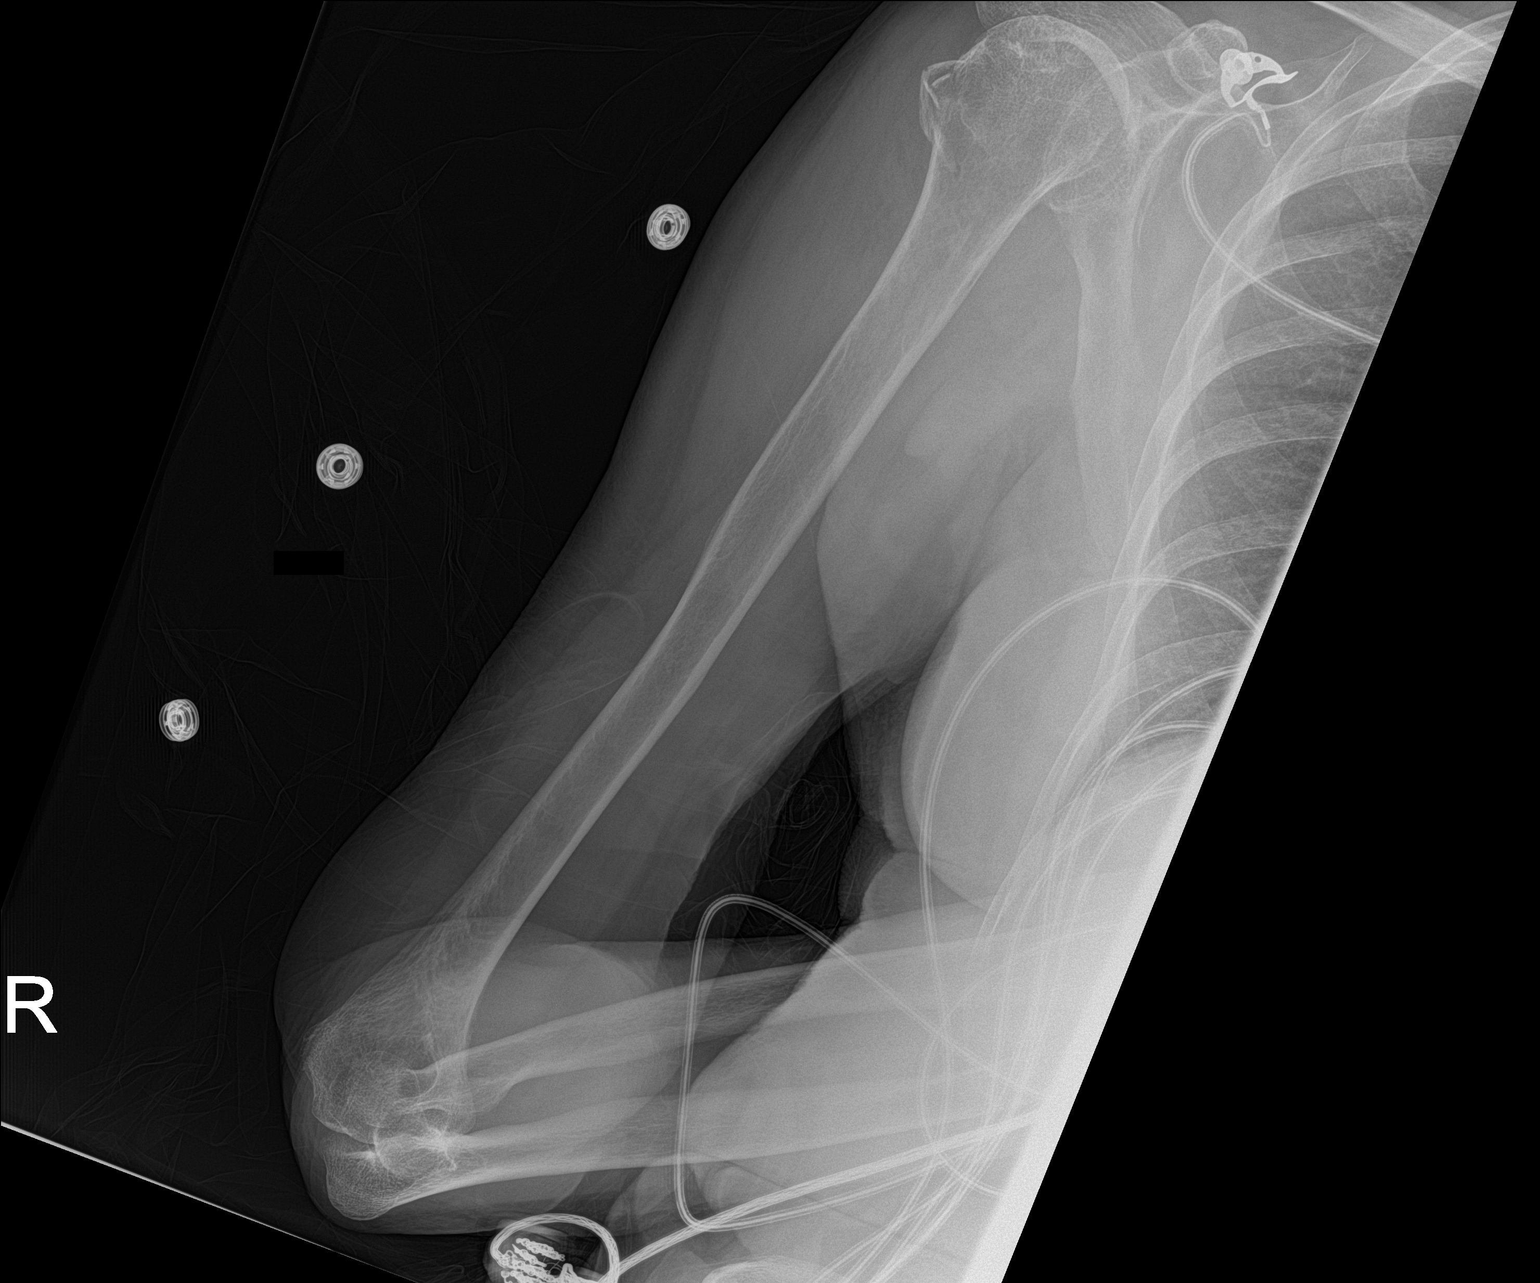

[2 of 2 positions shown; findings below may reference images not displayed]

FINDINGS: Fracture involving the greater tuberosity, mildly comminuted.

Mid/distal humerus is intact.

Visualized right lung is clear.

No radiopaque foreign body is seen.
IMPRESSION: Fracture involving the greater tuberosity, mildly comminuted.

## 2022-03-28 IMAGING — DX DG HAND COMPLETE 3+V*L*
1 series · 3 of 3 positions shown · non-contrast
Comparison: None.

CLINICAL DATA: Fall, broken glass, evaluate for foreign body

EXAM:
LEFT HAND - COMPLETE 3+ VIEW

[Series 1: hand · 0.14mm/px · 3 of 3 slices shown]
[im 1/3]
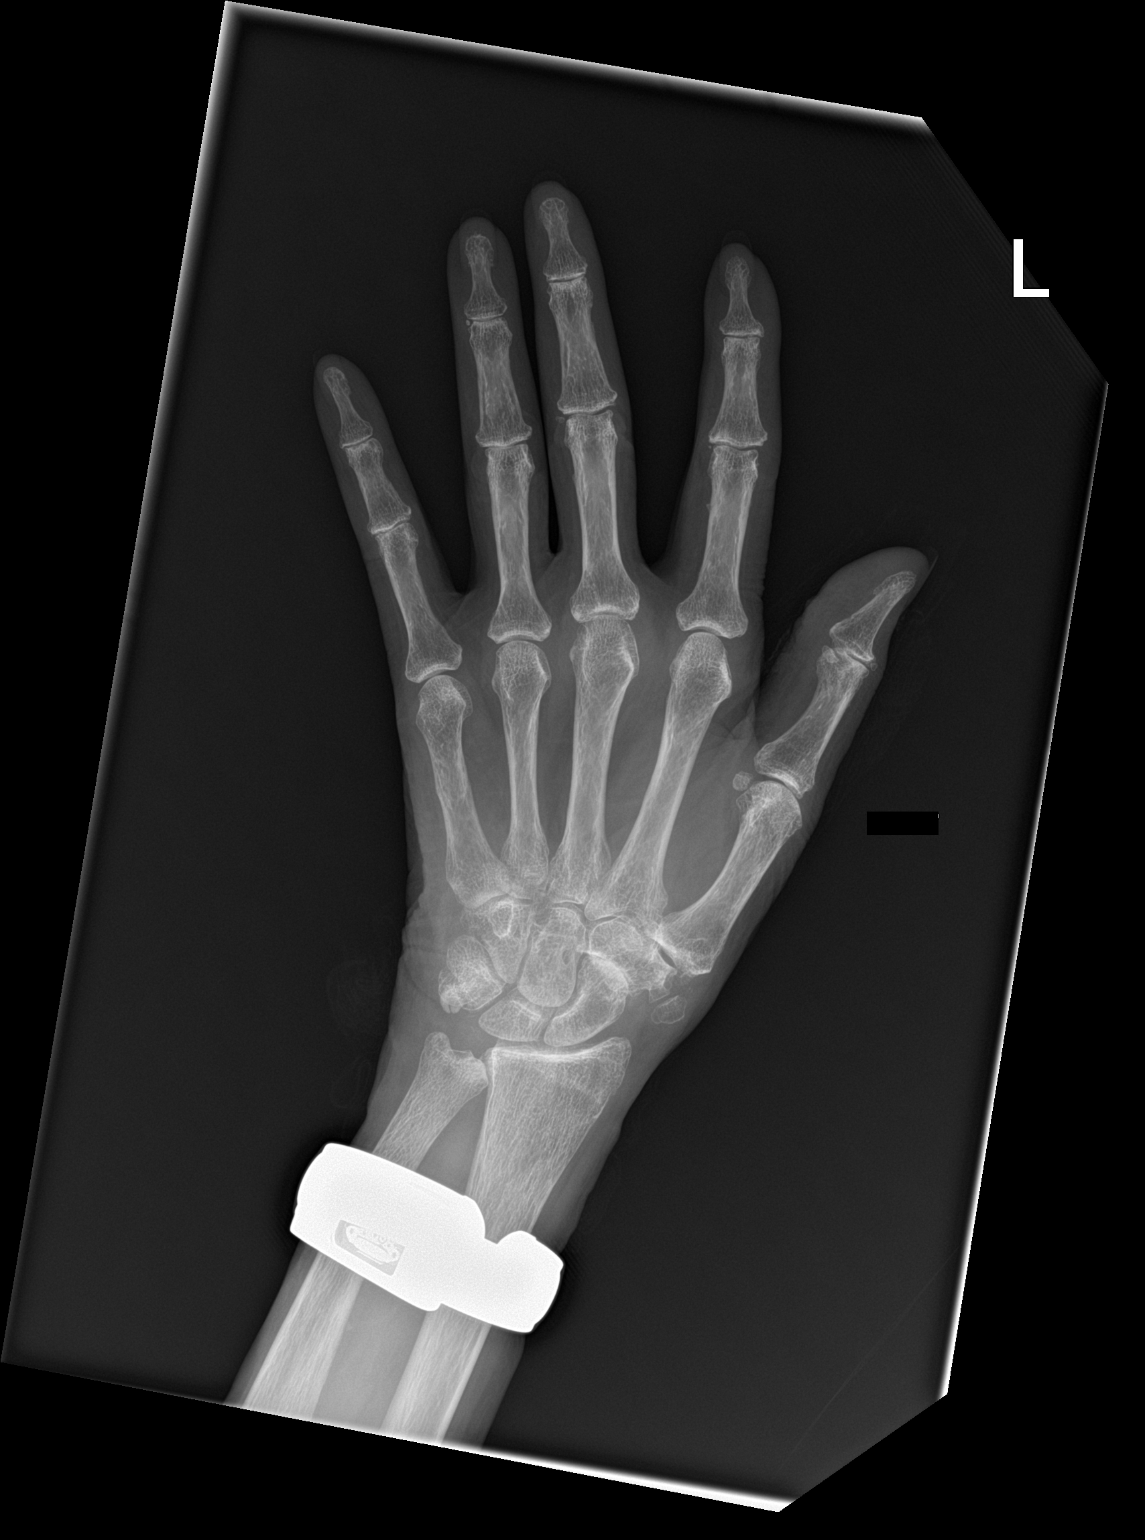
[im 2/3]
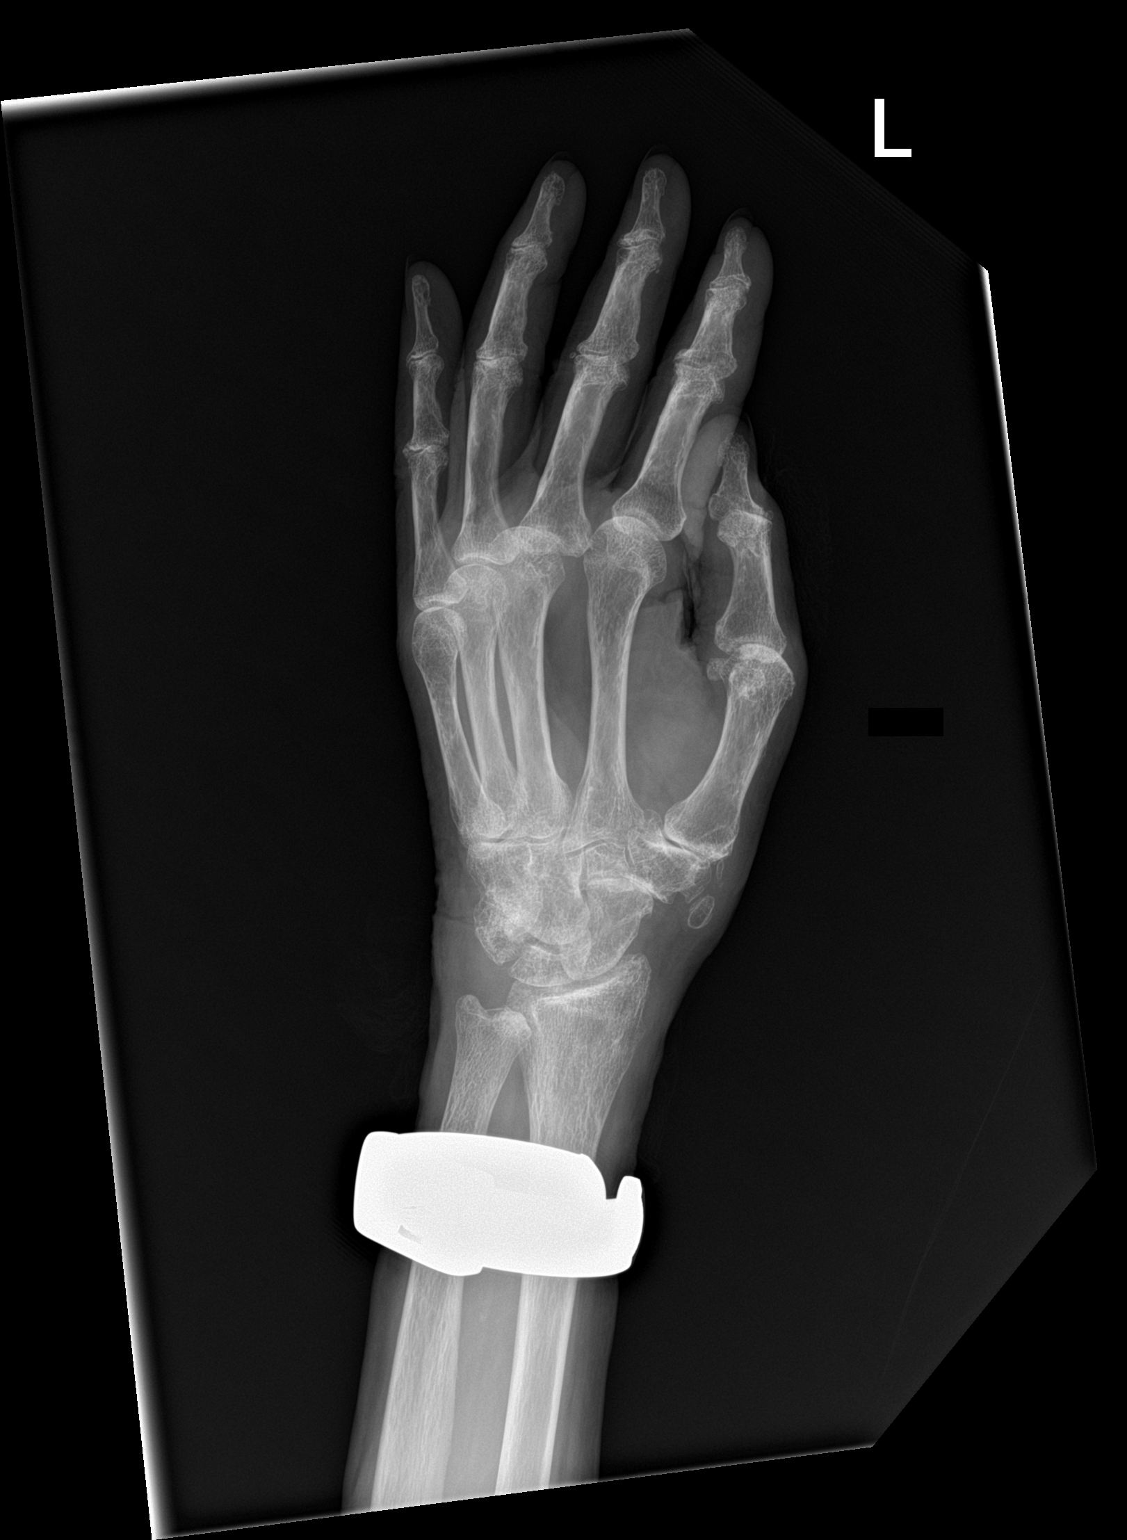
[im 3/3]
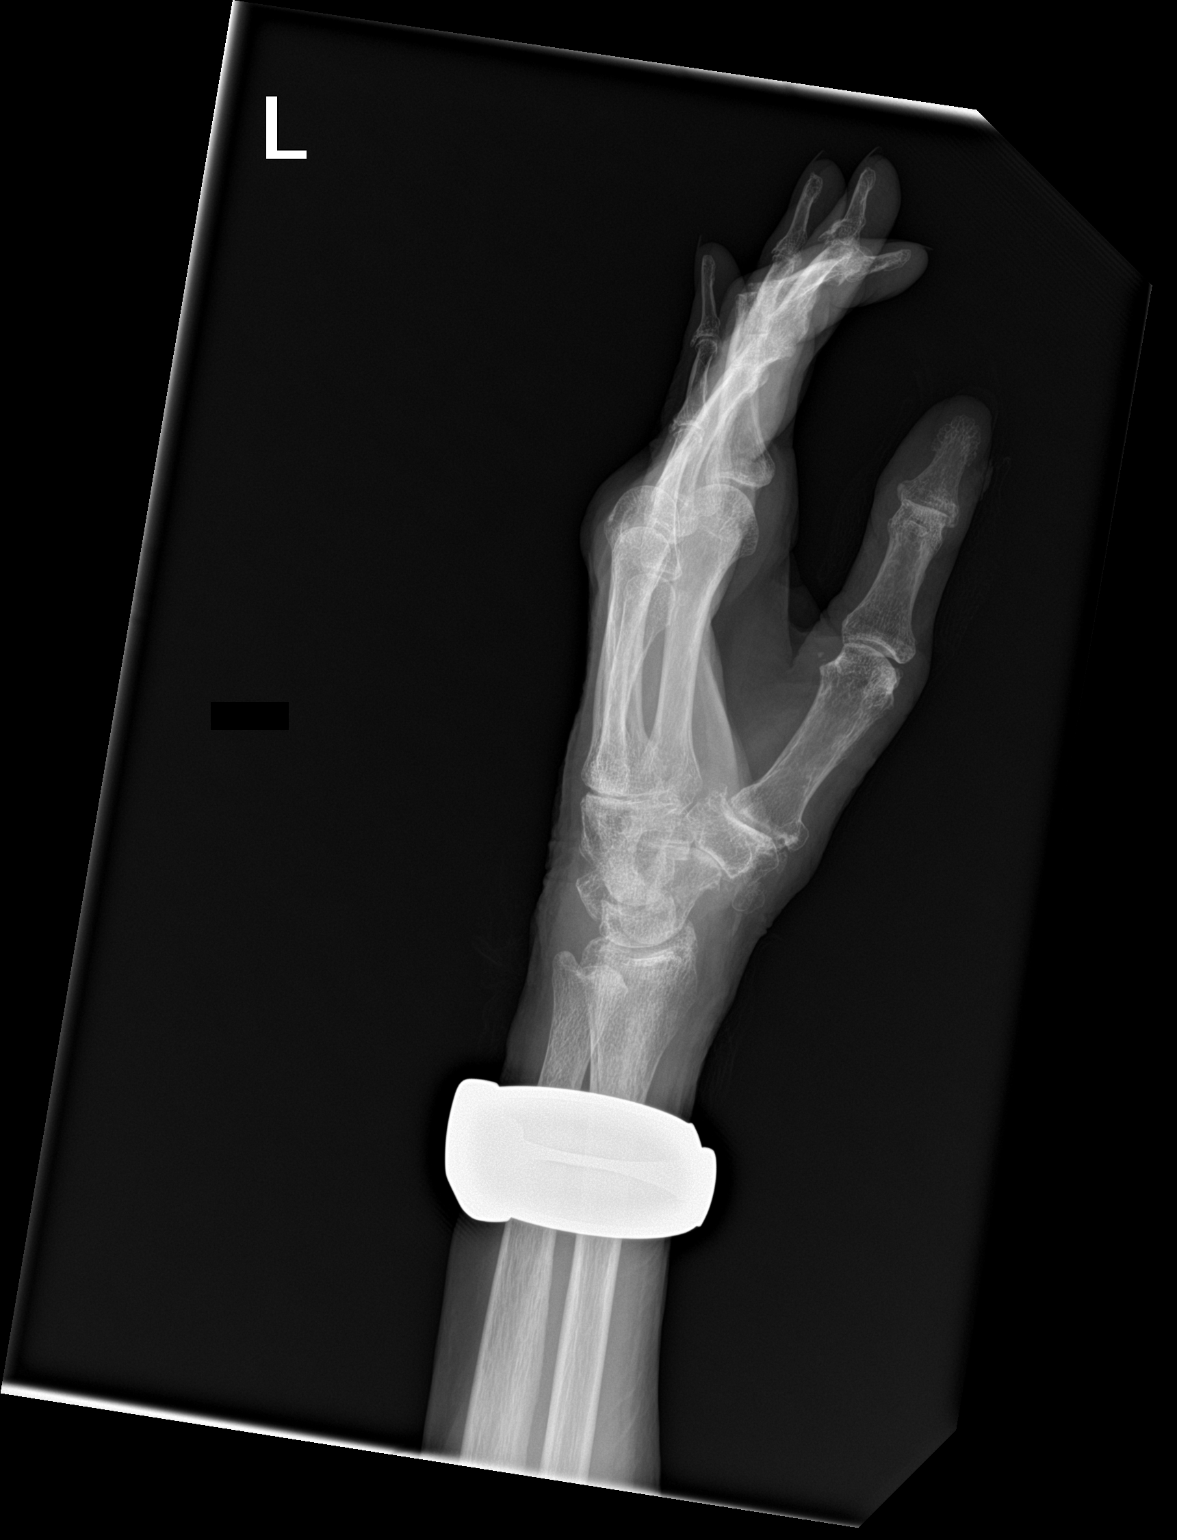

[3 of 3 positions shown; findings below may reference images not displayed]

FINDINGS: No fracture or dislocation is seen.

Mild degenerative changes of the 1st CMC joint.

Visualized soft tissues are within normal limits.

No radiopaque foreign body is seen.
IMPRESSION: No fracture, dislocation, or radiopaque foreign body is seen.

## 2022-03-28 IMAGING — CT CT CERVICAL SPINE W/O CM
3 series · 15 of 33 positions shown, 18 images · non-contrast
Comparison: Head CT today.

CLINICAL DATA: 87-year-old female status post fall.

EXAM:
CT CERVICAL SPINE WITHOUT CONTRAST
TECHNIQUE: Multidetector CT imaging of the cervical spine was performed without
intravenous contrast. Multiplanar CT image reconstructions were also
generated.

[Series 7: c_spine 2.0 st · axial · 0.39mm/px · z∈[-251,-115]mm · 7 of 82 slices shown, 9 images]
[im 7/82  soft-tissue]
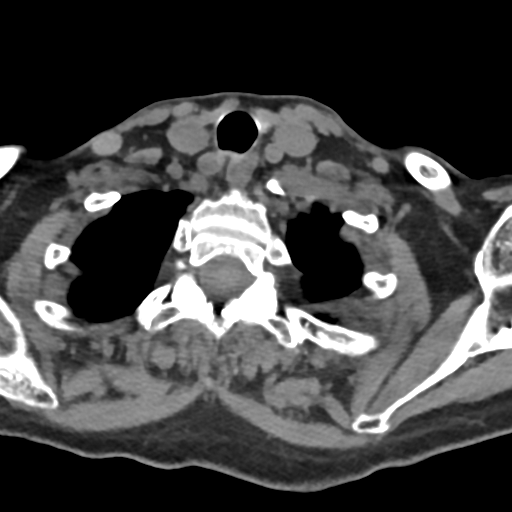
[im 7/82  bone]
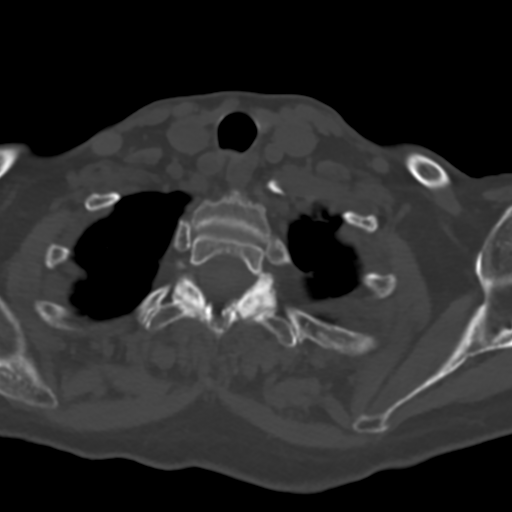
[im 19/82  bone]
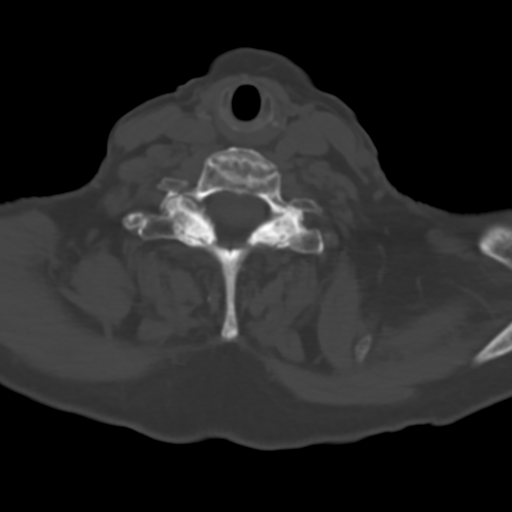
[im 32/82  bone]
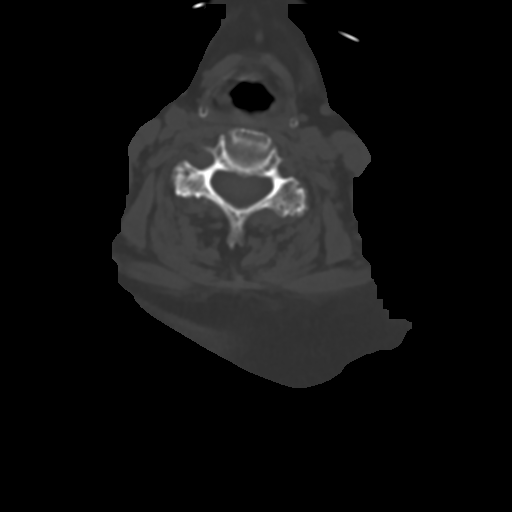
[im 44/82  bone]
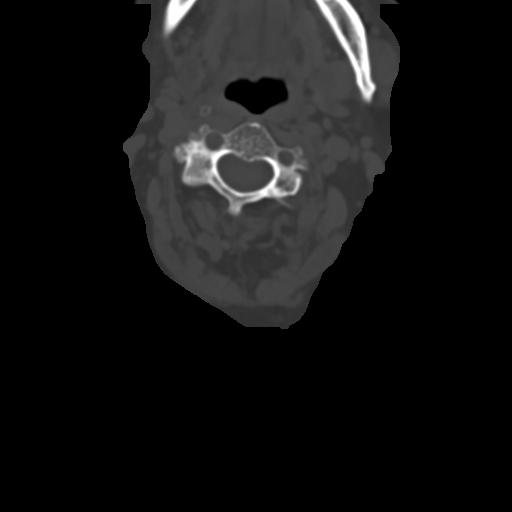
[im 50/82  soft-tissue]
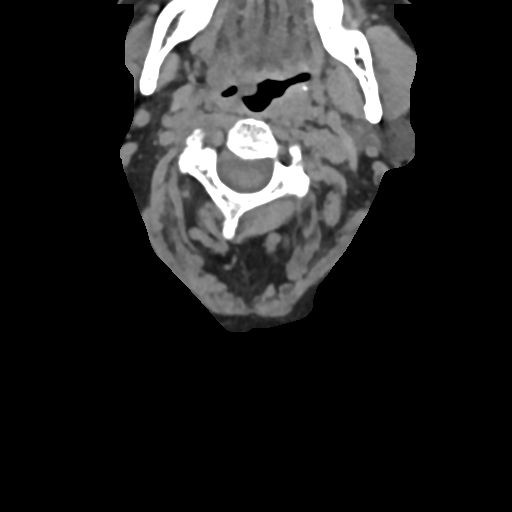
[im 50/82  bone]
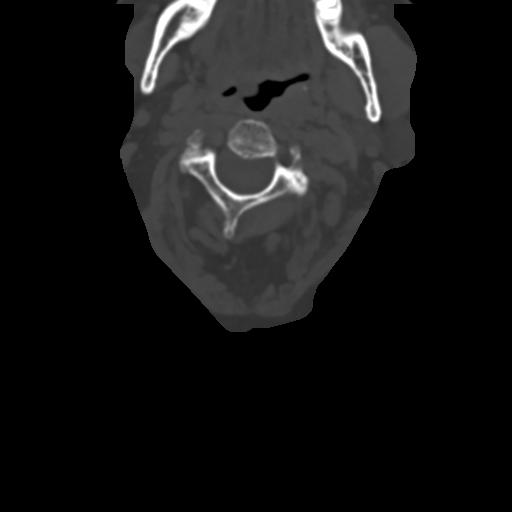
[im 63/82  bone]
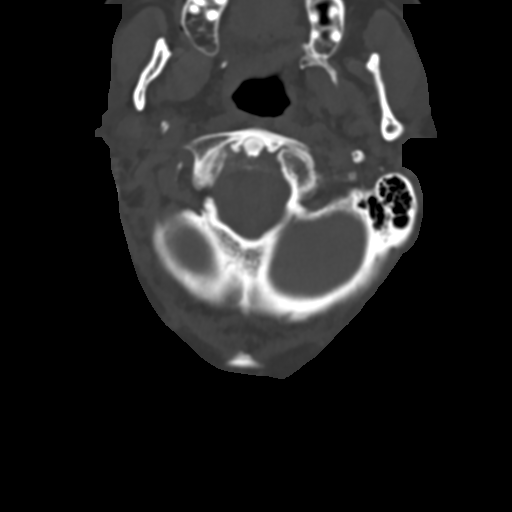
[im 75/82  bone]
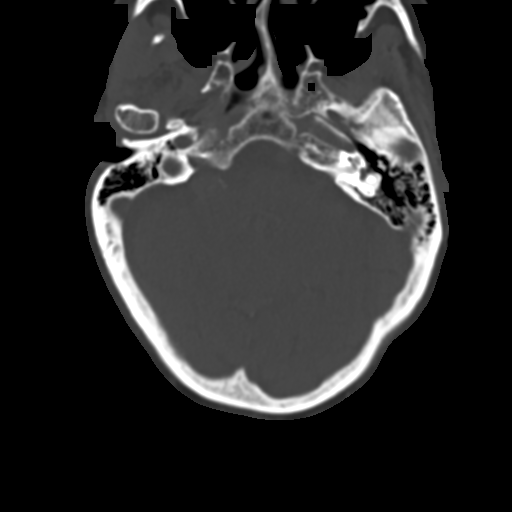

[Series 10: coronal bone · coronal · 0.30mm/px · 3 of 67 slices shown]
[im 14/67  bone]
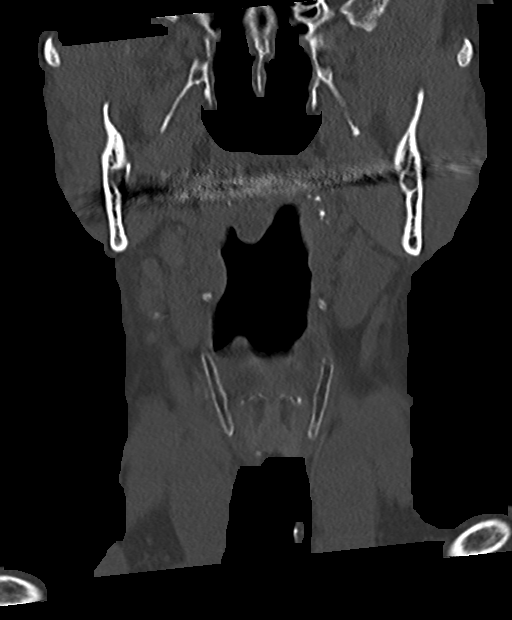
[im 27/67  bone]
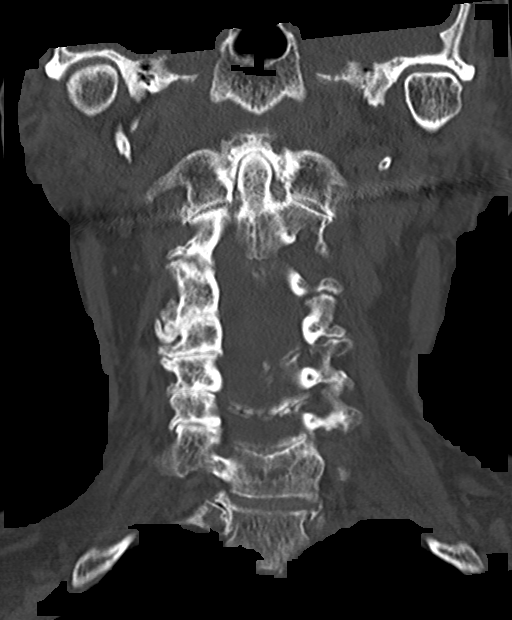
[im 40/67  bone]
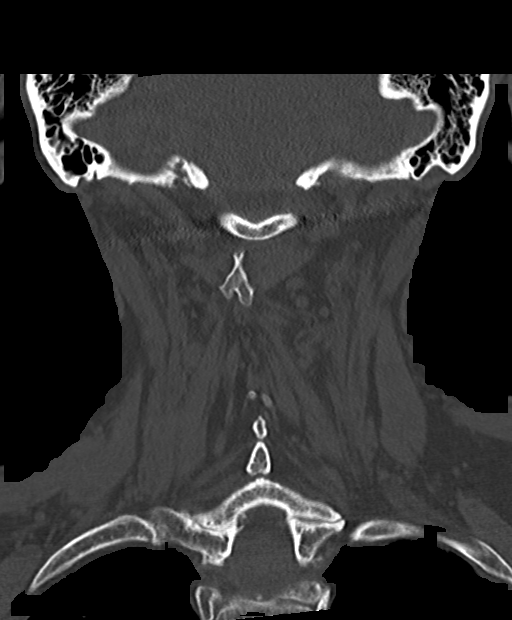

[Series 11: sagittal bone · sagittal · 0.32mm/px · 5 of 46 slices shown, 6 images]
[im 16/46  bone]
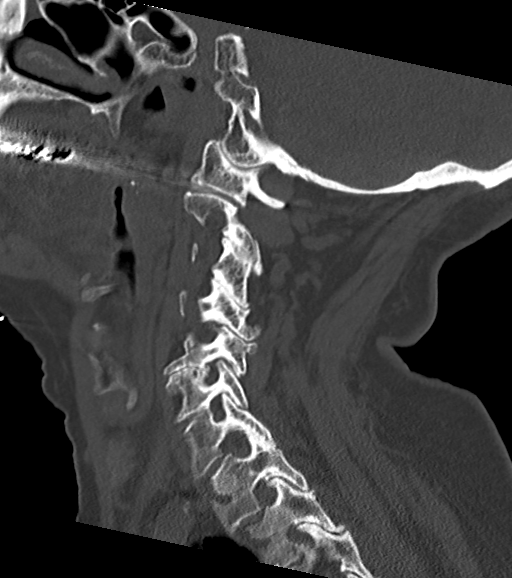
[im 19/46  bone]
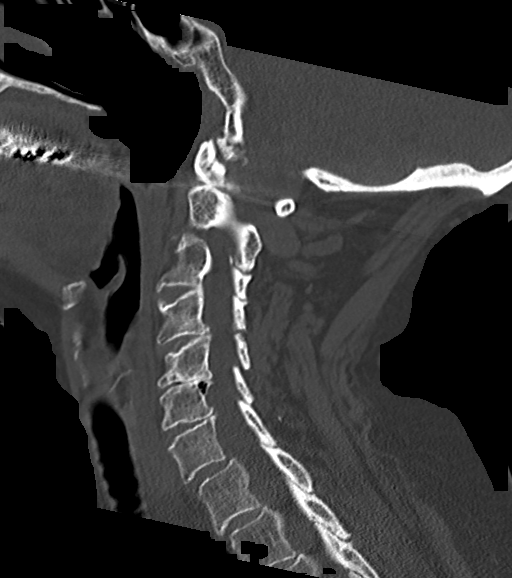
[im 23/46  soft-tissue]
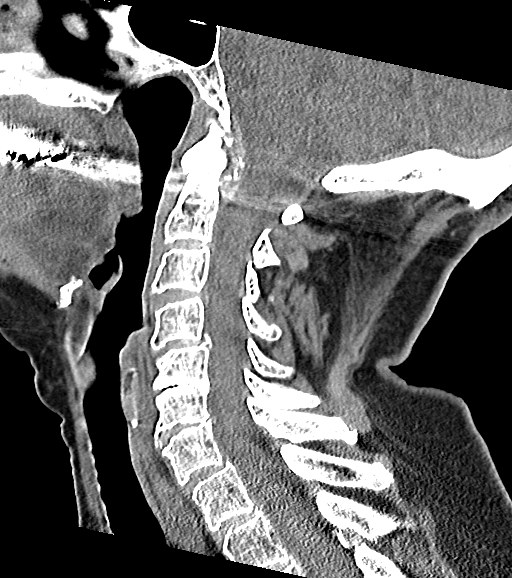
[im 23/46  bone]
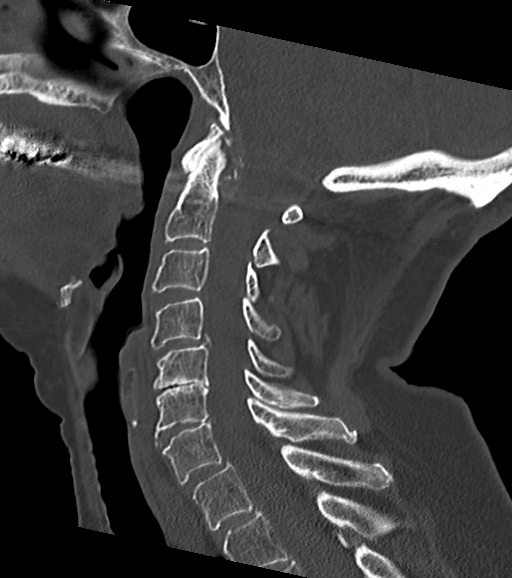
[im 27/46  bone]
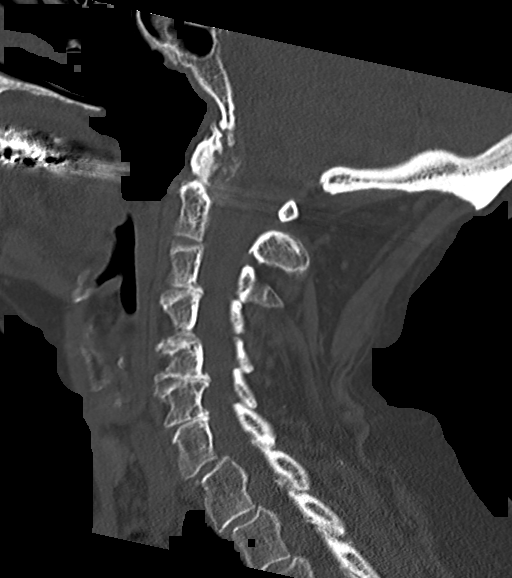
[im 31/46  bone]
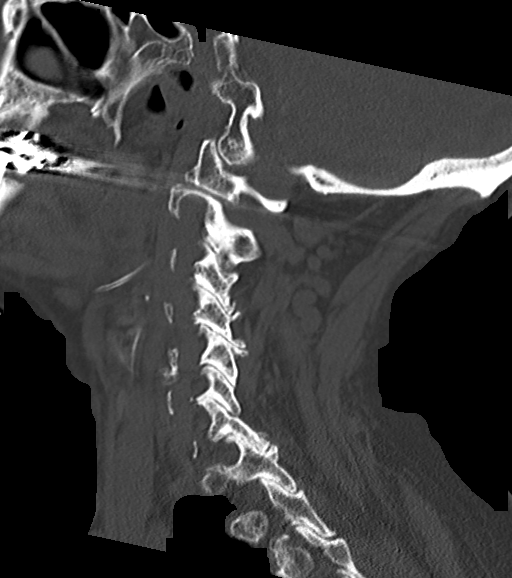

[15 of 33 positions shown; findings below may reference images not displayed]

FINDINGS: Alignment: Mild degenerative anterolisthesis in the cervical spine
at C4-C5, C7-T1. Preserved lordosis. Bilateral posterior element
alignment is within normal limits.

Skull base and vertebrae: Visualized skull base is intact. No
atlanto-occipital dissociation. C1 and C2 appear intact and aligned.
No acute osseous abnormality identified.

Soft tissues and spinal canal: No prevertebral fluid or swelling. No
visible canal hematoma. Mild for age calcified cervical carotid
atherosclerosis. Negative visible other noncontrast neck soft
tissues.

Disc levels: The dominant cervical degenerative finding is facet
arthropathy, which is widespread and moderate to severe. Disc and
endplate degeneration maximal at C5-C6. Calcified degenerative
ligamentous hypertrophy about the odontoid. But no significant
spinal stenosis suspected.

Upper chest: Grossly intact visible upper thoracic levels with facet
degeneration. Negative lung apices.
IMPRESSION: 1. No acute traumatic injury identified in the cervical spine.
2. Advanced cervical facet arthropathy, with mild degenerative
spondylolisthesis at several levels.

## 2022-04-21 DIAGNOSIS — F332 Major depressive disorder, recurrent severe without psychotic features: Secondary | ICD-10-CM | POA: Diagnosis not present

## 2022-04-22 DIAGNOSIS — H40013 Open angle with borderline findings, low risk, bilateral: Secondary | ICD-10-CM | POA: Diagnosis not present

## 2022-05-20 DIAGNOSIS — F332 Major depressive disorder, recurrent severe without psychotic features: Secondary | ICD-10-CM | POA: Diagnosis not present

## 2022-06-23 DIAGNOSIS — R809 Proteinuria, unspecified: Secondary | ICD-10-CM | POA: Diagnosis not present

## 2022-06-23 DIAGNOSIS — I10 Essential (primary) hypertension: Secondary | ICD-10-CM | POA: Diagnosis not present

## 2022-06-23 DIAGNOSIS — E039 Hypothyroidism, unspecified: Secondary | ICD-10-CM | POA: Diagnosis not present

## 2022-06-23 DIAGNOSIS — Z23 Encounter for immunization: Secondary | ICD-10-CM | POA: Diagnosis not present

## 2022-06-23 DIAGNOSIS — F322 Major depressive disorder, single episode, severe without psychotic features: Secondary | ICD-10-CM | POA: Diagnosis not present

## 2022-06-23 DIAGNOSIS — Z6821 Body mass index (BMI) 21.0-21.9, adult: Secondary | ICD-10-CM | POA: Diagnosis not present

## 2022-06-23 DIAGNOSIS — G479 Sleep disorder, unspecified: Secondary | ICD-10-CM | POA: Diagnosis not present

## 2022-06-23 DIAGNOSIS — M810 Age-related osteoporosis without current pathological fracture: Secondary | ICD-10-CM | POA: Diagnosis not present

## 2022-06-25 ENCOUNTER — Other Ambulatory Visit (HOSPITAL_BASED_OUTPATIENT_CLINIC_OR_DEPARTMENT_OTHER): Payer: Self-pay | Admitting: Cardiology

## 2022-06-25 NOTE — Telephone Encounter (Signed)
Rx request sent to pharmacy.  

## 2022-10-07 DIAGNOSIS — Z1389 Encounter for screening for other disorder: Secondary | ICD-10-CM | POA: Diagnosis not present

## 2022-10-07 DIAGNOSIS — Z6822 Body mass index (BMI) 22.0-22.9, adult: Secondary | ICD-10-CM | POA: Diagnosis not present

## 2022-10-07 DIAGNOSIS — Z0000 Encounter for general adult medical examination without abnormal findings: Secondary | ICD-10-CM | POA: Diagnosis not present

## 2022-10-15 DIAGNOSIS — Z1231 Encounter for screening mammogram for malignant neoplasm of breast: Secondary | ICD-10-CM | POA: Diagnosis not present

## 2022-10-21 DIAGNOSIS — Z8659 Personal history of other mental and behavioral disorders: Secondary | ICD-10-CM | POA: Diagnosis not present

## 2022-10-21 DIAGNOSIS — M810 Age-related osteoporosis without current pathological fracture: Secondary | ICD-10-CM | POA: Diagnosis not present

## 2022-10-21 DIAGNOSIS — G479 Sleep disorder, unspecified: Secondary | ICD-10-CM | POA: Diagnosis not present

## 2022-10-21 DIAGNOSIS — I10 Essential (primary) hypertension: Secondary | ICD-10-CM | POA: Diagnosis not present

## 2022-10-21 DIAGNOSIS — E039 Hypothyroidism, unspecified: Secondary | ICD-10-CM | POA: Diagnosis not present

## 2022-10-21 DIAGNOSIS — Z6823 Body mass index (BMI) 23.0-23.9, adult: Secondary | ICD-10-CM | POA: Diagnosis not present

## 2022-11-05 ENCOUNTER — Ambulatory Visit (HOSPITAL_BASED_OUTPATIENT_CLINIC_OR_DEPARTMENT_OTHER): Payer: Medicare Other | Admitting: Cardiology

## 2023-02-16 ENCOUNTER — Other Ambulatory Visit: Payer: Self-pay

## 2023-02-16 ENCOUNTER — Encounter (HOSPITAL_BASED_OUTPATIENT_CLINIC_OR_DEPARTMENT_OTHER): Payer: Self-pay | Admitting: Emergency Medicine

## 2023-02-16 ENCOUNTER — Emergency Department (HOSPITAL_BASED_OUTPATIENT_CLINIC_OR_DEPARTMENT_OTHER): Payer: Medicare Other | Admitting: Radiology

## 2023-02-16 ENCOUNTER — Inpatient Hospital Stay (HOSPITAL_BASED_OUTPATIENT_CLINIC_OR_DEPARTMENT_OTHER)
Admission: EM | Admit: 2023-02-16 | Discharge: 2023-02-18 | DRG: 293 | Disposition: A | Discharge status: Home / Self Care | Payer: Medicare Other | Attending: Internal Medicine | Admitting: Internal Medicine

## 2023-02-16 DIAGNOSIS — I509 Heart failure, unspecified: Secondary | ICD-10-CM | POA: Diagnosis not present

## 2023-02-16 DIAGNOSIS — I110 Hypertensive heart disease with heart failure: Secondary | ICD-10-CM | POA: Diagnosis not present

## 2023-02-16 DIAGNOSIS — I10 Essential (primary) hypertension: Secondary | ICD-10-CM | POA: Diagnosis not present

## 2023-02-16 DIAGNOSIS — I83893 Varicose veins of bilateral lower extremities with other complications: Secondary | ICD-10-CM | POA: Diagnosis present

## 2023-02-16 DIAGNOSIS — Z888 Allergy status to other drugs, medicaments and biological substances status: Secondary | ICD-10-CM | POA: Diagnosis not present

## 2023-02-16 DIAGNOSIS — Z7982 Long term (current) use of aspirin: Secondary | ICD-10-CM

## 2023-02-16 DIAGNOSIS — M7989 Other specified soft tissue disorders: Secondary | ICD-10-CM | POA: Diagnosis not present

## 2023-02-16 DIAGNOSIS — I2510 Atherosclerotic heart disease of native coronary artery without angina pectoris: Secondary | ICD-10-CM | POA: Diagnosis present

## 2023-02-16 DIAGNOSIS — E8770 Fluid overload, unspecified: Secondary | ICD-10-CM

## 2023-02-16 DIAGNOSIS — Z96651 Presence of right artificial knee joint: Secondary | ICD-10-CM | POA: Diagnosis present

## 2023-02-16 DIAGNOSIS — J439 Emphysema, unspecified: Secondary | ICD-10-CM | POA: Diagnosis present

## 2023-02-16 DIAGNOSIS — I872 Venous insufficiency (chronic) (peripheral): Secondary | ICD-10-CM | POA: Diagnosis present

## 2023-02-16 DIAGNOSIS — Z823 Family history of stroke: Secondary | ICD-10-CM | POA: Diagnosis not present

## 2023-02-16 DIAGNOSIS — Z8249 Family history of ischemic heart disease and other diseases of the circulatory system: Secondary | ICD-10-CM

## 2023-02-16 DIAGNOSIS — M79661 Pain in right lower leg: Secondary | ICD-10-CM | POA: Diagnosis not present

## 2023-02-16 DIAGNOSIS — G4700 Insomnia, unspecified: Secondary | ICD-10-CM | POA: Diagnosis present

## 2023-02-16 DIAGNOSIS — R0609 Other forms of dyspnea: Secondary | ICD-10-CM | POA: Diagnosis not present

## 2023-02-16 DIAGNOSIS — R600 Localized edema: Secondary | ICD-10-CM | POA: Diagnosis present

## 2023-02-16 DIAGNOSIS — R7989 Other specified abnormal findings of blood chemistry: Secondary | ICD-10-CM

## 2023-02-16 DIAGNOSIS — Z6824 Body mass index (BMI) 24.0-24.9, adult: Secondary | ICD-10-CM | POA: Diagnosis not present

## 2023-02-16 DIAGNOSIS — D7589 Other specified diseases of blood and blood-forming organs: Secondary | ICD-10-CM | POA: Diagnosis present

## 2023-02-16 DIAGNOSIS — Z79899 Other long term (current) drug therapy: Secondary | ICD-10-CM

## 2023-02-16 DIAGNOSIS — I5032 Chronic diastolic (congestive) heart failure: Secondary | ICD-10-CM | POA: Diagnosis present

## 2023-02-16 DIAGNOSIS — Z96642 Presence of left artificial hip joint: Secondary | ICD-10-CM | POA: Diagnosis present

## 2023-02-16 DIAGNOSIS — F419 Anxiety disorder, unspecified: Secondary | ICD-10-CM | POA: Insufficient documentation

## 2023-02-16 DIAGNOSIS — D72819 Decreased white blood cell count, unspecified: Secondary | ICD-10-CM | POA: Diagnosis present

## 2023-02-16 DIAGNOSIS — M810 Age-related osteoporosis without current pathological fracture: Secondary | ICD-10-CM | POA: Diagnosis present

## 2023-02-16 DIAGNOSIS — E039 Hypothyroidism, unspecified: Secondary | ICD-10-CM

## 2023-02-16 DIAGNOSIS — T63301A Toxic effect of unspecified spider venom, accidental (unintentional), initial encounter: Secondary | ICD-10-CM | POA: Diagnosis present

## 2023-02-16 DIAGNOSIS — Z79890 Hormone replacement therapy (postmenopausal): Secondary | ICD-10-CM

## 2023-02-16 DIAGNOSIS — R778 Other specified abnormalities of plasma proteins: Secondary | ICD-10-CM | POA: Diagnosis not present

## 2023-02-16 LAB — TROPONIN I (HIGH SENSITIVITY)
Troponin I (High Sensitivity): 28 ng/L — ABNORMAL HIGH (ref <18)
Troponin I (High Sensitivity): 30 ng/L — ABNORMAL HIGH (ref <18)

## 2023-02-16 LAB — COMPREHENSIVE METABOLIC PANEL
ALT: 11 U/L (ref 0–44)
AST: 27 U/L (ref 15–41)
Albumin: 4.6 g/dL (ref 3.5–5.0)
Alkaline Phosphatase: 81 U/L (ref 38–126)
Anion gap: 13 (ref 5–15)
BUN: 14 mg/dL (ref 8–23)
CO2: 24 mmol/L (ref 22–32)
Calcium: 9.9 mg/dL (ref 8.9–10.3)
Chloride: 100 mmol/L (ref 98–111)
Creatinine, Ser: 0.84 mg/dL (ref 0.44–1.00)
GFR, Estimated: >60 mL/min (ref >60)
Glucose, Bld: 83 mg/dL (ref 70–99)
Potassium: 4 mmol/L (ref 3.5–5.1)
Sodium: 137 mmol/L (ref 135–145)
Total Bilirubin: 0.6 mg/dL (ref 0.3–1.2)
Total Protein: 8 g/dL (ref 6.5–8.1)

## 2023-02-16 LAB — CBC
HCT: 40.9 % (ref 36.0–46.0)
Hemoglobin: 13.5 g/dL (ref 12.0–15.0)
MCH: 34.3 pg — ABNORMAL HIGH (ref 26.0–34.0)
MCHC: 33 g/dL (ref 30.0–36.0)
MCV: 103.8 fL — ABNORMAL HIGH (ref 80.0–100.0)
Platelets: 244 10*3/uL (ref 150–400)
RBC: 3.94 MIL/uL (ref 3.87–5.11)
RDW: 14 % (ref 11.5–15.5)
WBC: 3.5 10*3/uL — ABNORMAL LOW (ref 4.0–10.5)
nRBC: 0 % (ref 0.0–0.2)

## 2023-02-16 LAB — BRAIN NATRIURETIC PEPTIDE: B Natriuretic Peptide: 429.2 pg/mL — ABNORMAL HIGH (ref 0.0–100.0)

## 2023-02-16 MED ORDER — ENOXAPARIN SODIUM 40 MG/0.4ML IJ SOSY
40.0000 mg | PREFILLED_SYRINGE | INTRAMUSCULAR | Status: DC
  Filled 2023-02-16 (×2): qty 0.4

## 2023-02-16 MED ORDER — FUROSEMIDE 10 MG/ML IJ SOLN
40.0000 mg | Freq: Once | INTRAMUSCULAR | Status: AC
  Administered 2023-02-16: 40 mg via INTRAVENOUS
  Filled 2023-02-16: qty 4

## 2023-02-16 MED ORDER — MAGNESIUM HYDROXIDE 400 MG/5ML PO SUSP: 30.0000 mL | Freq: Every day | ORAL | Status: DC | PRN

## 2023-02-16 MED ORDER — ACETAMINOPHEN 650 MG RE SUPP: 650.0000 mg | Freq: Four times a day (QID) | RECTAL | Status: DC | PRN

## 2023-02-16 MED ORDER — ACETAMINOPHEN 325 MG PO TABS
650.0000 mg | ORAL_TABLET | Freq: Four times a day (QID) | ORAL | Status: DC | PRN
  Administered 2023-02-17: 650 mg via ORAL
  Filled 2023-02-16 (×3): qty 2

## 2023-02-16 MED ORDER — TRAZODONE HCL 50 MG PO TABS
25.0000 mg | ORAL_TABLET | Freq: Every evening | ORAL | Status: DC | PRN
  Administered 2023-02-17: 25 mg via ORAL
  Filled 2023-02-16: qty 1

## 2023-02-16 MED ORDER — ONDANSETRON HCL 4 MG PO TABS: 4.0000 mg | ORAL_TABLET | Freq: Four times a day (QID) | ORAL | Status: DC | PRN

## 2023-02-16 MED ORDER — FUROSEMIDE 10 MG/ML IJ SOLN
20.0000 mg | Freq: Two times a day (BID) | INTRAMUSCULAR | Status: DC
  Administered 2023-02-17 – 2023-02-18 (×4): 20 mg via INTRAVENOUS
  Filled 2023-02-16 (×4): qty 2

## 2023-02-16 MED ORDER — ONDANSETRON HCL 4 MG/2ML IJ SOLN: 4.0000 mg | Freq: Four times a day (QID) | INTRAMUSCULAR | Status: DC | PRN

## 2023-02-16 MED ORDER — SODIUM CHLORIDE 0.9 % IV SOLN
2.0000 g | Freq: Every day | INTRAVENOUS | Status: DC
  Administered 2023-02-17 (×2): 2 g via INTRAVENOUS
  Filled 2023-02-16 (×2): qty 20

## 2023-02-16 MED ORDER — LACTATED RINGERS IV SOLN
150.0000 mL/h | INTRAVENOUS | Status: DC
  Administered 2023-02-17: 150 mL/h via INTRAVENOUS

## 2023-02-16 NOTE — ED Notes (Signed)
Family reports patient drinks several glasses of wine, possibly whole bottle every day. Also drinks gin and tonics. For "her whole life." Last drink was last night. Family reports pt has gone several weeks without drinking previously- but was on clonazepam at the time. Currently is not taking this.

## 2023-02-16 NOTE — ED Provider Notes (Signed)
[facility] EMERGENCY DEPARTMENT AT [facility] Provider Note   CSN: [id number] Arrival date & time: 02/16/23  1358     History  Chief Complaint  Patient presents with  . Leg Swelling    Tiffany Fox is a 89 y.o. female with a past medical history of hypertension presenting today with concern for leg swelling.  Patient was sent from her PCP after complaining of 3 weeks worth of swelling to the mid calf.  No history of heart failure.  No shortness of breath, chest pain or palpitations.  No abdominal swelling.  She has no history of DVT/PE, recent travel, recent surgery, does not smoke and is not on any estrogen products.  She is on amlodipine but has been on this for several years.  PCP believed he heard something abnormal on lung auscultation and sent her to the emergency department.  HPI     Home Medications Prior to Admission medications   Medication Sig Start Date End Date Taking? Authorizing Provider  amLODipine (NORVASC) 5 MG tablet TAKE ONE TABLET BY MOUTH ONCE DAILY 06/25/22   Jodelle Red, MD  aspirin EC 81 MG tablet Take 1 tablet (81 mg total) by mouth 2 (two) times daily. 09/30/21   Allena Katz, PA-C  Calcium-Phosphorus-Vitamin D (CITRACAL +D3 PO) Take 2 tablets by mouth daily.    [provider]  Camphor-Menthol-Methyl Sal (SALONPAS) 3.10-10-08 % PTCH Apply 1 patch topically daily as needed (pain). With Lidocaine    [provider]  clonazePAM (KLONOPIN) 0.5 MG tablet Take 1 mg by mouth at bedtime.    [provider]  Multiple Vitamins-Minerals (HAIR/SKIN/NAILS/BIOTIN PO) Take 3 each by mouth daily.    [provider]  oxyCODONE-acetaminophen (PERCOCET/ROXICET) 5-325 MG tablet Take 1 tablet by mouth every 4 (four) hours as needed for severe pain. 09/30/21   Allena Katz, PA-C  thyroid (ARMOUR) 60 MG tablet Take 60 mg by mouth daily before breakfast.     [provider]  tiZANidine (ZANAFLEX) 2 MG  tablet Take 1 tablet (2 mg total) by mouth every 6 (six) hours as needed. 09/30/21   Allena Katz, PA-C      Allergies    Lisinopril    Review of Systems   Review of Systems  Physical Exam Updated Vital Signs BP (!) 161/94 (BP Location: Right Arm)   Pulse 94   Temp 98.6 F (37 C)   Resp 16   Wt 60.8 kg   SpO2 96%   BMI 23.74 kg/m  Physical Exam Vitals and nursing note reviewed.  Constitutional:      General: She is not in acute distress.    Appearance: Normal appearance. She is not ill-appearing.  HENT:     Head: Normocephalic and atraumatic.  Eyes:     General: No scleral icterus.    Conjunctiva/sclera: Conjunctivae normal.  Cardiovascular:     Rate and Rhythm: Normal rate and regular rhythm.  Pulmonary:     Effort: Pulmonary effort is normal. No respiratory distress.     Breath sounds: No wheezing or rales.  Abdominal:     General: Abdomen is flat.     Palpations: Abdomen is soft.     Tenderness: There is no abdominal tenderness.  Musculoskeletal:     Right lower leg: Edema present.     Left lower leg: Edema present.     Comments: 1+ pitting edema bilaterally just above the ankle  Skin:    General: Skin is warm and  dry.     Findings: No rash.  Neurological:     Mental Status: She is alert.  Psychiatric:        Mood and Affect: Mood normal.     ED Results / Procedures / Treatments   Labs (all labs ordered are listed, but only abnormal results are displayed) Labs Reviewed  CBC - Abnormal; Notable for the following components:      Result Value   WBC 3.5 (*)    MCV 103.8 (*)    MCH 34.3 (*)    All other components within normal limits  BRAIN NATRIURETIC PEPTIDE - Abnormal; Notable for the following components:   B Natriuretic Peptide 429.2 (*)    All other components within normal limits  TROPONIN I (HIGH SENSITIVITY) - Abnormal; Notable for the following components:   Troponin I (High Sensitivity) 28 (*)    All other components within normal  limits  COMPREHENSIVE METABOLIC PANEL  TROPONIN I (HIGH SENSITIVITY)    EKG None  Radiology DG Chest 2 View  Result Date: 02/16/2023 CLINICAL DATA:  Bilateral leg swelling.  Question fluid. EXAM: CHEST - 2 VIEW COMPARISON:  07/23/2021. FINDINGS: Unchanged hyperexpanded lungs with mild interstitial prominence, consistent with emphysema. Stable cardiac and mediastinal contours. No consolidation or pulmonary edema. No pleural effusion or pneumothorax. IMPRESSION: 1.  No evidence of acute cardiopulmonary disease. 2. Emphysema (ICD10-J43.9). Electronically Signed   By: Orvan Falconer M.D.   On: 02/16/2023 15:02    Procedures Procedures    Medications Ordered in ED Medications  furosemide (LASIX) injection 40 mg (40 mg Intravenous Given 02/16/23 1548)    ED Course/ Medical Decision Making/ A&P Clinical Course as of 02/16/23 2027  Tue Feb 16, 2023  1620 This is a very pleasant 89 year old female with a history of hypertension presenting to the ED with complaint of leg swelling.  The patient lives a very sedentary lifestyle, according to her daughter, and is basically in the house most of the day.  The patient has noted swelling in both of her legs for the past 3 to 4 weeks, prompting her PCPs office to send her into the ED for evaluation.  She denies any known preceding history of heart failure or MI or coronary disease, however she did have a PE study performed in the ER about 3 years ago that did show coronary artery atherosclerosis.  She denies to me that she has had any chest pain, pressure, dizziness, nausea, or shortness of breath, orthopnea.  On exam the patient is comfortable, mildly hypertensive, she is not tachypneic or tachycardic.  She does have some mild symmetrical pitting edema of the lower extremities.  She does not have any audible murmurs, lungs appear clear to auscultation.  Her workup today does show some mildly elevated troponin at 28 with a repeat pending.  BNP is elevated at  429.  Creatinine is within normal limits.  No acute anemia.  Her EKG per my interpretation shows a sinus rhythm with new T wave inversions in the anterior lateral leads, compared to the EKG last taken in 2020.  Her chest x-ray shows no acute findings.  We will discuss the case with cardiology regarding the likely new diagnosis of congestive heart failure.  Given the patient's sedentary lifestyle I do think a PE study would be reasonable.  Patient was given some Lasix for lower extremity edema.  To consider medical admission for new onset CHF  [MT]  2026 Patient was updated that the delay is secondary  to [facility].  I explained why we do not let people go POV and she voiced understanding. [MR]    Clinical Course User Index [MR] Laysha Childers, Gabriel Cirri, PA-C [MT] Trifan, Kermit Balo, MD                             Medical Decision Making Amount and/or Complexity of Data Reviewed Labs: ordered. Radiology: ordered.  Risk Prescription drug management. Decision regarding hospitalization.   Patient presenting today from doctor's office due to bilateral lower extremity swelling.  Differential includes but is not limited to new onset CHF, DVT, cellulitis, PAD, venous stasis, third spacing  this is not an exhaustive differential.    Past Medical History / Co-morbidities / Social History: Hypothyroidism   Additional history: -Per chart review patient follows with [facility] as her PCP.  Daughter told PCP that patient had gained 20 pounds in the last couple of months and objectively she was 10 pounds heavier than her visit in January.  They have noticed lower extremity swelling for the past 3 weeks.  Thought to not be secondary to amlodipine due to duration of use.  -Patient had crackles on her PE with PCP today.  Recommended to come to the emergency department.   -Patient has no documentation with cardiology.  In March 2020 patient was noted to have some borderline cardiomegaly with some calcified coronary  artery atherosclerosis.  This was not followed up on   Physical Exam: Pertinent physical exam findings include Normal lung auscultation 1+ pitting edema bilateral lower extremities  Lab Tests: I ordered, and personally interpreted labs.  The pertinent results include: BNP 429 Troponin 28 >>>   Imaging Studies: I ordered and independently visualized and interpreted CXR and I agree with the radiologist that there are no acute findings.   Cardiac Monitoring:  The patient was maintained on a cardiac monitor.  I viewed and interpreted the cardiac monitored which showed an underlying rhythm of: NSR   Medications: Lasix 40mg   Consultations Obtained: I spoke with Dr. Gaynelle Arabian with cardiology.  He recommends admission for echocardiogram and further workup for new onset heart failure   MDM/Disposition: This is a 89 year old female who presented today with leg swelling from her PCP.  X-ray was negative for pulmonary edema.  Her BNP was elevated to 429.  Additionally, she had a troponin of 28 with nonspecific EKG changes.  Overall patient has a heart score of 5.  I spoke with cardiology who recommends admission for further workup of her potential heart failure and EKG changes.  My attending physician discussed this plan with the patient and she was agreeable to admission.  Admit to Dr. Jomarie Longs     Final Clinical Impression(s) / ED Diagnoses Final diagnoses:  Hypervolemia, unspecified hypervolemia type  Elevated troponin    Rx / DC Orders ED Discharge Orders     None         I discussed this case with my attending physician Dr. Renaye Rakers who cosigned this note including patient's presenting symptoms, physical exam, and planned diagnostics and interventions. Attending physician stated agreement with plan or made changes to plan which were implemented.     Woodroe Chen 02/16/23 1654    Terald Sleeper, MD 02/16/23 2256

## 2023-02-16 NOTE — ED Triage Notes (Signed)
Pt here from Md office for bil leg swelling and some concern for fluid in her lungs at the office , no chest pain or sob

## 2023-02-16 NOTE — ED Notes (Signed)
Tiffany Fox [phone] daughter  Roshonda Pappan [phone] daughter  Please call for updates.

## 2023-02-17 ENCOUNTER — Other Ambulatory Visit (HOSPITAL_COMMUNITY): Payer: Medicare Other

## 2023-02-17 ENCOUNTER — Inpatient Hospital Stay (HOSPITAL_COMMUNITY): Payer: Medicare Other

## 2023-02-17 DIAGNOSIS — M79661 Pain in right lower leg: Secondary | ICD-10-CM

## 2023-02-17 DIAGNOSIS — R600 Localized edema: Secondary | ICD-10-CM | POA: Diagnosis not present

## 2023-02-17 DIAGNOSIS — I10 Essential (primary) hypertension: Secondary | ICD-10-CM

## 2023-02-17 DIAGNOSIS — E8770 Fluid overload, unspecified: Secondary | ICD-10-CM

## 2023-02-17 DIAGNOSIS — E039 Hypothyroidism, unspecified: Secondary | ICD-10-CM | POA: Diagnosis not present

## 2023-02-17 DIAGNOSIS — F419 Anxiety disorder, unspecified: Secondary | ICD-10-CM | POA: Insufficient documentation

## 2023-02-17 DIAGNOSIS — I872 Venous insufficiency (chronic) (peripheral): Secondary | ICD-10-CM | POA: Diagnosis not present

## 2023-02-17 LAB — MAGNESIUM: Magnesium: 1.8 mg/dL (ref 1.7–2.4)

## 2023-02-17 LAB — PROTIME-INR
INR: 1 (ref 0.8–1.2)
Prothrombin Time: 13 seconds (ref 11.4–15.2)

## 2023-02-17 LAB — BASIC METABOLIC PANEL
Anion gap: 12 (ref 5–15)
BUN: 14 mg/dL (ref 8–23)
CO2: 22 mmol/L (ref 22–32)
Calcium: 9 mg/dL (ref 8.9–10.3)
Chloride: 99 mmol/L (ref 98–111)
Creatinine, Ser: 0.94 mg/dL (ref 0.44–1.00)
GFR, Estimated: 59 mL/min — ABNORMAL LOW (ref >60)
Glucose, Bld: 95 mg/dL (ref 70–99)
Potassium: 3.1 mmol/L — ABNORMAL LOW (ref 3.5–5.1)
Sodium: 133 mmol/L — ABNORMAL LOW (ref 135–145)

## 2023-02-17 LAB — CBC
HCT: 36 % (ref 36.0–46.0)
Hemoglobin: 12.3 g/dL (ref 12.0–15.0)
MCH: 35.3 pg — ABNORMAL HIGH (ref 26.0–34.0)
MCHC: 34.2 g/dL (ref 30.0–36.0)
MCV: 103.4 fL — ABNORMAL HIGH (ref 80.0–100.0)
Platelets: 225 10*3/uL (ref 150–400)
RBC: 3.48 MIL/uL — ABNORMAL LOW (ref 3.87–5.11)
RDW: 13.9 % (ref 11.5–15.5)
WBC: 3.8 10*3/uL — ABNORMAL LOW (ref 4.0–10.5)
nRBC: 0 % (ref 0.0–0.2)

## 2023-02-17 LAB — LACTIC ACID, PLASMA: Lactic Acid, Venous: 1.5 mmol/L (ref 0.5–1.9)

## 2023-02-17 LAB — APTT: aPTT: 28 seconds (ref 24–36)

## 2023-02-17 MED ORDER — SODIUM CHLORIDE 0.9 % IV SOLN: INTRAVENOUS | Status: DC

## 2023-02-17 MED ORDER — TRAZODONE HCL 50 MG PO TABS
50.0000 mg | ORAL_TABLET | Freq: Every day | ORAL | Status: DC
  Filled 2023-02-17: qty 1

## 2023-02-17 MED ORDER — AMLODIPINE BESYLATE 5 MG PO TABS: 5.0000 mg | ORAL_TABLET | Freq: Every day | ORAL | Status: DC

## 2023-02-17 MED ORDER — MELATONIN 3 MG PO TABS
3.0000 mg | ORAL_TABLET | Freq: Every evening | ORAL | Status: DC | PRN
  Administered 2023-02-17: 3 mg via ORAL
  Filled 2023-02-17: qty 1

## 2023-02-17 MED ORDER — POTASSIUM CHLORIDE CRYS ER 20 MEQ PO TBCR
40.0000 meq | EXTENDED_RELEASE_TABLET | Freq: Once | ORAL | Status: AC
  Administered 2023-02-17: 40 meq via ORAL
  Filled 2023-02-17: qty 2

## 2023-02-17 MED ORDER — GABAPENTIN 100 MG PO CAPS
100.0000 mg | ORAL_CAPSULE | Freq: Every day | ORAL | Status: DC
  Filled 2023-02-17: qty 1

## 2023-02-17 MED ORDER — AMLODIPINE BESYLATE 5 MG PO TABS
5.0000 mg | ORAL_TABLET | Freq: Once | ORAL | Status: AC
  Administered 2023-02-18: 5 mg via ORAL
  Filled 2023-02-17: qty 1

## 2023-02-17 MED ORDER — THYROID 60 MG PO TABS
60.0000 mg | ORAL_TABLET | Freq: Every day | ORAL | Status: DC
  Administered 2023-02-18: 60 mg via ORAL
  Filled 2023-02-17: qty 1

## 2023-02-17 NOTE — Assessment & Plan Note (Addendum)
-   We will continue thyroid replacement.

## 2023-02-17 NOTE — Care Plan (Addendum)
Patient has been educated on risks of possible falls relating to her getting out of bed by herself. Patient is aware of the dangers of doing so and has refused the bed alarm to be on, even after education.   Patient is not happy that test results are currently being evaluated; stated "what is taking them so long" and "[facility] is the worst place even with their good reputation". Patient seems to be upset, angry, and denied her CHF diagnosis as well as swelling in her legs. I asked Dr. Blanchard Mane to stop by and explain the diagnoses and the goals of care for the patient to help ease her mind.

## 2023-02-17 NOTE — Progress Notes (Signed)
TR[facility] night cross cover note:   I was notified by RN that the patient no longer takes prn trazodone as a sleep aid at home.  Rather, she is on prn melatonin for insomnia as an outpatient.  I subsequently discontinued existing order for prn trazodone and replace this with order for as needed melatonin.   Update: Patient also conveys that at home, she takes as needed Tylenol PM for insomnia refractory to melatonin.  She is currently experiencing some difficulty sleeping after prn dose of melatonin earlier this evening.  I subsequently placed order for prn Benadryl for insomnia refractory to melatonin.      Newton Pigg, DO Hospitalist

## 2023-02-17 NOTE — Care Management (Signed)
  Transition of Care [facility]) Screening Note   Patient Details  Name: CHARDAY EBERSOLE Date of Birth: 10-Feb-1934   Transition of Care [facility]) CM/SW Contact:    Gala Lewandowsky, RN Phone Number: 02/17/2023, 3:41 PM    Transition of Care Department [facility]) has reviewed the patient and no TOC needs have been identified at this time. Patient presented for leg swelling. No family at the bedside during the visit. Patient states she is independent from home alone and has support of daughter that is visiting. Patient states she has a cane and rolling walker in the home. We will continue to monitor patient advancement through interdisciplinary progression rounds. If new patient transition needs arise, please place a TOC consult.

## 2023-02-17 NOTE — Consult Note (Addendum)
Cardiology Consultation   Patient ID: Tiffany Fox MRN: [id number]; DOB: 1934/04/05  Admit date: 02/16/2023 Date of Consult: 02/17/2023  PCP:  [facility] Providers Cardiologist:  Jodelle Red, MD       Patient Profile:   Tiffany Fox is a 89 y.o. female with a hx of leg edema, leukocytopenia, hypertension and a history of syncope who is being seen 02/17/2023 for the evaluation of leg edema at the request of Dr. Jerolyn Center.  History of Present Illness:   Tiffany Fox is a pleasant 89 year old female with past medical history of leg edema, leukocytopenia, hypertension and a history of syncope.  Patient was initially seen by cardiology service in 2020 for tachycardia and orthostatic hypotension after knee surgery.  Fish oil was stopped due to chronic diarrhea.  She also had a rash on lisinopril.  She had severe swelling on 10 mg amlodipine but tolerates 5 mg well.  She previously had 2 episodes of syncope, both occurred during warm weather while outside.  She had prodromal symptom of lightheadedness and dizziness.  This was felt to be reflex syncope.  She was last seen by cardiology service in December 2022 for preoperative clearance at which time she was doing well.  Patient was sent to the ED on 02/16/2023 by her PCP after complaining of 3 weeks worth of swelling to the mid calf.  Patient reportedly heard something abnormal on her lung auscultation.  Currently looking with the patient, she has been having increased swelling for roughly 4 weeks.  She says her leg swelling was present even after overnight sleep.  She also complains of a dry cough that started prior to Christmas and has been going on for 5 months.  BNP 429.2.  Serial troponin 28-->30.  CMP were normal.  White blood cell count 3.5.  Hemoglobin 13.5.  She was subsequently transferred to [facility] for further evaluation.  She was admitted by hospitalist  service.  Patient received 40 mg IV Lasix yesterday and was placed on 20 mg twice daily IV Lasix today.  She put out 1.4 L of urine.  Her lower extremity on exam appears to be dry, scaly, erythematous and tender with deep palpation.  It is not warm to the touch.  Lower extremity edema has significantly improved.   Past Medical History:  Diagnosis Date   Anxiety    Arthritis    Cervical spondylosis    Depression    Hypertension    Hypothyroidism    Leukocytopenia    Osteoporosis     Past Surgical History:  Procedure Laterality Date   APPENDECTOMY  2012   EYE SURGERY Bilateral 2012   FRACTURE SURGERY     HIP FRACTURE SURGERY Left 05.2011   JOINT REPLACEMENT     TOTAL HIP REVISION Left 09/30/2021   Procedure: LEFT TOTAL HIP REVISION POSTERIOR AND NAIL REMOVAL;  Surgeon: Gean Birchwood, MD;  Location: [facility] ORS;  Service: Orthopedics;  Laterality: Left;   TOTAL KNEE ARTHROPLASTY Right 10/18/2018   TOTAL KNEE ARTHROPLASTY Right 10/18/2018   Procedure: TOTAL KNEE ARTHROPLASTY;  Surgeon: Marcene Corning, MD;  Location: [facility] OR;  Service: Orthopedics;  Laterality: Right;     Home Medications:  Prior to Admission medications   Medication Sig Start Date End Date Taking? Authorizing Provider  amLODipine (NORVASC) 5 MG tablet TAKE ONE TABLET BY MOUTH ONCE DAILY 06/25/22  Yes Jodelle Red, MD  aspirin EC 81 MG tablet Take 1  tablet (81 mg total) by mouth 2 (two) times daily. 09/30/21  Yes Dannielle Burn K, PA-C  Calcium-Phosphorus-Vitamin D (CITRACAL +D3 PO) Take 2 tablets by mouth daily.   Yes [provider]  Camphor-Menthol-Methyl Sal (SALONPAS) 3.10-10-08 % PTCH Apply 1 patch topically daily as needed (pain). With Lidocaine   Yes [provider]  Multiple Vitamins-Minerals (HAIR/SKIN/NAILS/BIOTIN PO) Take 3 each by mouth daily.   Yes [provider]  thyroid (ARMOUR) 60 MG tablet Take 60 mg by mouth daily before breakfast.    Yes [provider]   clonazePAM (KLONOPIN) 0.5 MG tablet Take 1 mg by mouth at bedtime. Patient not taking: Reported on 02/17/2023    [provider]  oxyCODONE-acetaminophen (PERCOCET/ROXICET) 5-325 MG tablet Take 1 tablet by mouth every 4 (four) hours as needed for severe pain. Patient not taking: Reported on 02/17/2023 09/30/21   Allena Katz, PA-C  tiZANidine (ZANAFLEX) 2 MG tablet Take 1 tablet (2 mg total) by mouth every 6 (six) hours as needed. Patient not taking: Reported on 02/17/2023 09/30/21   Allena Katz, PA-C    Inpatient Medications: Scheduled Meds:  enoxaparin (LOVENOX) injection  40 mg Subcutaneous Q24H   furosemide  20 mg Intravenous BID   Continuous Infusions:  sodium chloride 50 mL/hr at 02/17/23 0954   cefTRIAXone (ROCEPHIN)  IV 2 g (02/17/23 0155)   PRN Meds: acetaminophen **OR** acetaminophen, magnesium hydroxide, ondansetron **OR** ondansetron (ZOFRAN) IV, traZODone  Allergies:    Allergies  Allergen Reactions   Lisinopril Rash    Social History:   Social History   Socioeconomic History   Marital status: Widowed    Spouse name: Not on file   Number of children: Not on file   Years of education: Not on file   Highest education level: Not on file  Occupational History   Not on file  Tobacco Use   Smoking status: Never   Smokeless tobacco: Never  Vaping Use   Vaping Use: Never used  Substance and Sexual Activity   Alcohol use: Yes    Alcohol/week: 2.0 standard drinks of alcohol    Types: 2 Glasses of wine per week    Comment: wine - daily   Drug use: No   Sexual activity: Not on file  Other Topics Concern   Not on file  Social History Narrative   Not on file   Social Determinants of Health   Financial Resource Strain: Not on file  Food Insecurity: No Food Insecurity (02/16/2023)   Hunger Vital Sign    Worried About Running Out of Food in the Last Year: Never true    Ran Out of Food in the Last Year: Never true  Transportation Needs: No  Transportation Needs (02/16/2023)   PRAPARE - Administrator, Civil Service (Medical): No    Lack of Transportation (Non-Medical): No  Physical Activity: Not on file  Stress: Not on file  Social Connections: Not on file  Intimate Partner Violence: Not At Risk (02/16/2023)   Humiliation, Afraid, Rape, and Kick questionnaire    Fear of Current or Ex-Partner: No    Emotionally Abused: No    Physically Abused: No    Sexually Abused: No    Family History:    Family History  Problem Relation Age of Onset   Stroke Father    Heart disease Brother    Heart disease Maternal Grandfather    Heart disease Brother      ROS:  Please see the  history of present illness.   All other ROS reviewed and negative.     Physical Exam/Data:   Vitals:   02/16/23 2218 02/16/23 2303 02/17/23 0251 02/17/23 0721  BP:  (!) 171/92 139/71 (!) 151/81  Pulse: 93  84 92  Resp: (!) 22 19 17 18   Temp:  98.2 F (36.8 C) 97.8 F (36.6 C) 98.1 F (36.7 C)  TempSrc:   Oral Oral  SpO2: 98%  96% 96%  Weight:  59.6 kg      Intake/Output Summary (Last 24 hours) at 02/17/2023 1112 Last data filed at 02/17/2023 1041 Gross per 24 hour  Intake 840 ml  Output 2240 ml  Net -1400 ml      02/16/2023   11:03 PM 02/16/2023    2:21 PM 09/30/2021    6:47 PM  Last 3 Weights  Weight (lbs) 131 lb 4.8 oz 134 lb 119 lb 0.8 oz  Weight (kg) 59.557 kg 60.782 kg 54 kg     Body mass index is 23.26 kg/m.  General:  Well nourished, well developed, in no acute distress HEENT: normal Neck: no JVD Vascular: No carotid bruits; Distal pulses 2+ bilaterally Cardiac:  normal S1, S2; RRR; no murmur  Lungs:  clear to auscultation bilaterally, no wheezing, rhonchi or rales  Abd: soft, nontender, no hepatomegaly  Ext: no edema Musculoskeletal:  No deformities, BUE and BLE strength normal and equal Skin: Dry, scaly, erythematous with rash in the lower extremity Neuro:  CNs 2-12 intact, no focal abnormalities  noted Psych:  Normal affect   EKG:  The EKG was personally reviewed and demonstrates: Normal sinus rhythm, no significant ST-T wave changes. Telemetry:  Telemetry was personally reviewed and demonstrates: Sinus rhythm baseline heart rate 80s, with occasional tachycardia.  Relevant CV Studies:  N/A  Laboratory Data:  High Sensitivity Troponin:   Recent Labs  Lab 02/16/23 1452 02/16/23 1614  TROPONINIHS 28* 30*     Chemistry Recent Labs  Lab 02/16/23 1452 02/17/23 0147 02/17/23 0148  NA 137  --  133*  K 4.0  --  3.1*  CL 100  --  99  CO2 24  --  22  GLUCOSE 83  --  95  BUN 14  --  14  CREATININE 0.84  --  0.94  CALCIUM 9.9  --  9.0  MG  --  1.8  --   GFRNONAA >60  --  59*  ANIONGAP 13  --  12    Recent Labs  Lab 02/16/23 1452  PROT 8.0  ALBUMIN 4.6  AST 27  ALT 11  ALKPHOS 81  BILITOT 0.6   Lipids No results for input(s): "CHOL", "TRIG", "HDL", "LABVLDL", "LDLCALC", "CHOLHDL" in the last 168 hours.  Hematology Recent Labs  Lab 02/16/23 1452 02/17/23 0148  WBC 3.5* 3.8*  RBC 3.94 3.48*  HGB 13.5 12.3  HCT 40.9 36.0  MCV 103.8* 103.4*  MCH 34.3* 35.3*  MCHC 33.0 34.2  RDW 14.0 13.9  PLT 244 225   Thyroid No results for input(s): "TSH", "FREET4" in the last 168 hours.  BNP Recent Labs  Lab 02/16/23 1452  BNP 429.2*    DDimer No results for input(s): "DDIMER" in the last 168 hours.   Radiology/Studies:  DG Chest 2 View  Result Date: 02/16/2023 CLINICAL DATA:  Bilateral leg swelling.  Question fluid. EXAM: CHEST - 2 VIEW COMPARISON:  07/23/2021. FINDINGS: Unchanged hyperexpanded lungs with mild interstitial prominence, consistent with emphysema. Stable cardiac and mediastinal contours. No consolidation  or pulmonary edema. No pleural effusion or pneumothorax. IMPRESSION: 1.  No evidence of acute cardiopulmonary disease. 2. Emphysema (ICD10-J43.9). Electronically Signed   By: Orvan Falconer M.D.   On: 02/16/2023 15:02     Assessment and Plan:    Leg edema: BNP on arrival 429.2.  Chest x-ray showed no acute finding but does reveal emphysema.  Patient was given 40 mg IV Lasix yesterday and placed on 20 mg twice daily of IV Lasix.  On exam, patient appears to be euvolemic without JVD, pulmonary edema on auscultation or significant lower extremity edema.  Lower extremity redness is likely stasis dermatitis, they do not feel hot and there was no elevation of the white blood cell, suspicion for cellulitis is low.  Will discontinue home amlodipine, consider chlorthalidone versus spironolactone as alternative blood pressure control.  Pending venous doppler and echo cardiogram, if EF is normal, her lower extremity edema may not be from heart failure  Leukocytopenia: Chronic. Unclear cause  Hypertension: Home amlodipine held.  Consider alternatives such as chlorthalidone versus spironolactone.   Risk Assessment/Risk Scores:                For questions or updates, please contact [facility] Please consult [email or web address] for contact info under    Ramond Dial, [place]  02/17/2023 11:12 AM

## 2023-02-17 NOTE — Progress Notes (Signed)
Patient admitted today with lower extremity edema concern for heart failure versus cellulitis with history of chronic varicose vein issues.  Seen by cardiology.  Venous Doppler of the lower extremities negative.  DC IV fluids since she is also getting Lasix.  Echo pending.  Hopefully home tomorrow after echo.

## 2023-02-17 NOTE — Progress Notes (Signed)
TRH night cross cover note:   I was notified by RN of the patient's escalating blood pressure, now 162/104 in the setting of holding of home amlodipine 5 mg p.o. daily since time of admission.  I subsequently reordered home amlodipine, with first dose to occur now.      Newton Pigg, DO Hospitalist

## 2023-02-17 NOTE — Progress Notes (Signed)
Mobility Specialist Progress Note:   02/17/23 1000  Mobility  Activity Ambulated with assistance in hallway  Level of Assistance Contact guard assist, steadying assist  Assistive Device Front wheel walker  Distance Ambulated (ft) 450 ft  Activity Response Tolerated well  Mobility Referral Yes  $Mobility charge 1 Mobility  Mobility Specialist Start Time (ACUTE ONLY) 1000  Mobility Specialist Stop Time (ACUTE ONLY) 1030  Mobility Specialist Time Calculation (min) (ACUTE ONLY) 30 min   Pt eager for mobility session. Required only minG assist for safety. Pt running into objects in hallways, able to self-correct. Asx throughout ambulation, pt left sitting EOB with all needs met.   Addison Lank Mobility Specialist Please contact via SecureChat or  Rehab office at [phone]

## 2023-02-17 NOTE — Assessment & Plan Note (Signed)
We will continue Klonopin  ?

## 2023-02-17 NOTE — H&P (Signed)
[place]   PATIENT NAME: Tiffany Fox    MR#:  [id number]  DATE OF BIRTH:  Nov 26, 1933  DATE OF ADMISSION:  02/16/2023  PRIMARY CARE PHYSICIAN: [facility] [place]   Patient is coming from: Home  REQUESTING/REFERRING PHYSICIAN: Redwine, Madison A, PA-C   CHIEF COMPLAINT:   Chief Complaint  Patient presents with   Leg Swelling    HISTORY OF PRESENT ILLNESS:  Tiffany Fox is a 89 y.o. female with medical history significant for anxiety, depression, hypertension, hypothyroidism and osteoporosis, who presented to the emergency room with acute onset of bilateral lower extremity edema with mild pain and tenderness which has been intermittent over the last few months and worse over the last few days.  She had a spider bite on the right leg and her left leg is more erythematous and tender.  No chest pain or dyspnea or cough or wheezing.  She denies any orthopnea or paroxysmal nocturnal dyspnea or dyspnea on exertion.  No recent travels or surgeries.  No fever or chills.  ED Course: Upon presentation to the emergency room, BP was 161/94 with otherwise normal vital signs.  Labs revealed unremarkable CMP.  BNP was 429 point and high sensitive troponin I was 28 and later 30.  CBC showed mild leukopenia of 3.5 with macrocytosis..  Blood cultures were drawn. EKG as reviewed by me : EKG showed normal sinus rhythm with a rate of 87 with T wave inversion anterolaterally and inferiorly. Imaging: 2 view chest x-ray showed emphysema with no acute intracranial normalities.  The patient was given 40 mg of IV Lasix.  She was directly admitted to a progressive unit bed for further evaluation and management.  PAST MEDICAL HISTORY:   Past Medical History:  Diagnosis Date   Anxiety    Arthritis    Cervical spondylosis    Depression    Hypertension    Hypothyroidism    Leukocytopenia    Osteoporosis     PAST SURGICAL HISTORY:   Past Surgical History:   Procedure Laterality Date   APPENDECTOMY  2012   EYE SURGERY Bilateral 2012   FRACTURE SURGERY     HIP FRACTURE SURGERY Left 05.2011   JOINT REPLACEMENT     TOTAL HIP REVISION Left 09/30/2021   Procedure: LEFT TOTAL HIP REVISION POSTERIOR AND NAIL REMOVAL;  Surgeon: Gean Birchwood, MD;  Location: [facility];  Service: Orthopedics;  Laterality: Left;   TOTAL KNEE ARTHROPLASTY Right 10/18/2018   TOTAL KNEE ARTHROPLASTY Right 10/18/2018   Procedure: TOTAL KNEE ARTHROPLASTY;  Surgeon: Marcene Corning, MD;  Location: [facility];  Service: Orthopedics;  Laterality: Right;    SOCIAL HISTORY:   Social History   Tobacco Use   Smoking status: Never   Smokeless tobacco: Never  Substance Use Topics   Alcohol use: Yes    Alcohol/week: 2.0 standard drinks of alcohol    Types: 2 Glasses of wine per week    Comment: wine - daily    FAMILY HISTORY:   Family History  Problem Relation Age of Onset   Stroke Father    Heart disease Brother    Heart disease Maternal Grandfather    Heart disease Brother     DRUG ALLERGIES:   Allergies  Allergen Reactions   Lisinopril Rash    REVIEW OF SYSTEMS:   ROS As per history of present illness. All pertinent systems were reviewed above. Constitutional, HEENT, cardiovascular, respiratory, GI, GU, musculoskeletal, neuro, psychiatric, endocrine, integumentary and  hematologic systems were reviewed and are otherwise negative/unremarkable except for positive findings mentioned above in the HPI.   MEDICATIONS AT HOME:   Prior to Admission medications   Medication Sig Start Date End Date Taking? Authorizing Provider  amLODipine (NORVASC) 5 MG tablet TAKE ONE TABLET BY MOUTH ONCE DAILY 06/25/22   Jodelle Red, MD  aspirin EC 81 MG tablet Take 1 tablet (81 mg total) by mouth 2 (two) times daily. 09/30/21   Allena Katz, PA-C  Calcium-Phosphorus-Vitamin D (CITRACAL +D3 PO) Take 2 tablets by mouth daily.    [provider]   Camphor-Menthol-Methyl Sal (SALONPAS) 3.10-10-08 % PTCH Apply 1 patch topically daily as needed (pain). With Lidocaine    [provider]  clonazePAM (KLONOPIN) 0.5 MG tablet Take 1 mg by mouth at bedtime.    [provider]  Multiple Vitamins-Minerals (HAIR/SKIN/NAILS/BIOTIN PO) Take 3 each by mouth daily.    [provider]  oxyCODONE-acetaminophen (PERCOCET/ROXICET) 5-325 MG tablet Take 1 tablet by mouth every 4 (four) hours as needed for severe pain. 09/30/21   Allena Katz, PA-C  thyroid (ARMOUR) 60 MG tablet Take 60 mg by mouth daily before breakfast.     [provider]  tiZANidine (ZANAFLEX) 2 MG tablet Take 1 tablet (2 mg total) by mouth every 6 (six) hours as needed. 09/30/21   Allena Katz, PA-C      VITAL SIGNS:  Blood pressure 139/71, pulse 84, temperature 97.8 F (36.6 C), temperature source Oral, resp. rate 17, weight 59.6 kg, SpO2 96 %.  PHYSICAL EXAMINATION:  Physical Exam  GENERAL:  89 y.o.-year-old Caucasian female patient lying in the bed with no acute distress.  EYES: Pupils equal, round, reactive to light and accommodation. No scleral icterus. Extraocular muscles intact.  HEENT: Head atraumatic, normocephalic. Oropharynx and nasopharynx clear.  NECK:  Supple, no jugular venous distention. No thyroid enlargement, no tenderness.  LUNGS: Normal breath sounds bilaterally, no wheezing, rales,rhonchi or crepitation. No use of accessory muscles of respiration.  CARDIOVASCULAR: Regular rate and rhythm, S1, S2 normal. No murmurs, rubs, or gallops.  ABDOMEN: Soft, nondistended, nontender. Bowel sounds present. No organomegaly or mass.  EXTREMITIES: 1-2+ bilateral lower extremity pitting edema with no clubbing or cyanosis.     NEUROLOGIC: Cranial nerves II through XII are intact. Muscle strength 5/5 in all extremities. Sensation intact. Gait not checked.  PSYCHIATRIC: The patient is alert and oriented x 3.  Normal affect and good eye  contact. SKIN: Bilateral leg erythema with left anterior leg erythema with tenderness without fluctuation.  Bilateral Homans signs was negative. Right leg spider bites with surrounding erythema without tenderness.   LABORATORY PANEL:   CBC Recent Labs  Lab 02/17/23 0148  WBC 3.8*  HGB 12.3  HCT 36.0  PLT 225   ------------------------------------------------------------------------------------------------------------------  Chemistries  Recent Labs  Lab 02/16/23 1452 02/17/23 0148  NA 137 133*  K 4.0 3.1*  CL 100 99  CO2 24 22  GLUCOSE 83 95  BUN 14 14  CREATININE 0.84 0.94  CALCIUM 9.9 9.0  AST 27  --   ALT 11  --   ALKPHOS 81  --   BILITOT 0.6  --    ------------------------------------------------------------------------------------------------------------------  Cardiac Enzymes No results for input(s): "TROPONINI" in the last 168 hours. ------------------------------------------------------------------------------------------------------------------  RADIOLOGY:  DG Chest 2 View  Result Date: 02/16/2023 CLINICAL DATA:  Bilateral leg swelling.  Question fluid. EXAM: CHEST - 2 VIEW COMPARISON:  07/23/2021. FINDINGS: Unchanged hyperexpanded lungs with mild interstitial prominence,  consistent with emphysema. Stable cardiac and mediastinal contours. No consolidation or pulmonary edema. No pleural effusion or pneumothorax. IMPRESSION: 1.  No evidence of acute cardiopulmonary disease. 2. Emphysema (ICD10-J43.9). Electronically Signed   By: Orvan Falconer M.D.   On: 02/16/2023 15:02      IMPRESSION AND PLAN:  Assessment and Plan: Bilateral lower extremity edema - The patient was directed to a progressive unit bed. - The patient has erythema on the left leg with tenderness concerning for cellulitis of the left lower extremity. - We will need to rule out DVT. - Will obtain bilateral lower extremity venous Doppler. - Given elevated BNP, this could be new onset CHF  though the patient does not have typical other signs of CHF. - 2D echo will be obtained. - Cardiology was notified by the ED provider.  I also notified Salley Hews.  Hypothyroidism - We will continue thyroid replacement.  Essential hypertension - We will continue her antihypertensives.  Anxiety - We will continue Klonopin.   DVT prophylaxis: Lovenox.  Advanced Care Planning:  Code Status: full code.  Family Communication:  The plan of care was discussed in details with the patient (and family). I answered all questions. The patient agreed to proceed with the above mentioned plan. Further management will depend upon hospital course. Disposition Plan: Back to previous home environment Consults called: Cardiology. All the records are reviewed and case discussed with ED provider.  Status is: Inpatient   At the time of the admission, it appears that the appropriate admission status for this patient is inpatient.  This is judged to be reasonable and necessary in order to provide the required intensity of service to ensure the patient's safety given the presenting symptoms, physical exam findings and initial radiographic and laboratory data in the context of comorbid conditions.  The patient requires inpatient status due to high intensity of service, high risk of further deterioration and high frequency of surveillance required.  I certify that at the time of admission, it is my clinical judgment that the patient will require inpatient hospital care extending more than 2 midnights.                            Dispo: The patient is from: Home              Anticipated d/c is to: Home              Patient currently is not medically stable to d/c.              Difficult to place patient: No  Hannah Beat M.D on 02/17/2023 at 4:48 AM  [facility]   From 7 PM-7 AM, contact night-coverage [email or web address]  CC: Primary care physician; [facility] [place]

## 2023-02-17 NOTE — Assessment & Plan Note (Signed)
-   The patient was directed to a progressive unit bed. - The patient has erythema on the left leg with tenderness concerning for cellulitis of the left lower extremity. - We will need to rule out DVT. - Will obtain bilateral lower extremity venous Doppler. - Given elevated BNP, this could be new onset CHF though the patient does not have typical other signs of CHF. - 2D echo will be obtained. - Cardiology was notified by the ED provider.  I also notified Salley Hews.

## 2023-02-17 NOTE — Assessment & Plan Note (Signed)
-   We will continue her antihypertensives. 

## 2023-02-17 NOTE — Progress Notes (Signed)
Bilateral lower extremity venous duplex has been completed. Preliminary results can be found in CV Proc through chart review.   02/17/23 1:30 PM Olen Cordial RVT

## 2023-02-18 ENCOUNTER — Inpatient Hospital Stay (HOSPITAL_COMMUNITY): Payer: Medicare Other

## 2023-02-18 DIAGNOSIS — R0609 Other forms of dyspnea: Secondary | ICD-10-CM | POA: Diagnosis not present

## 2023-02-18 DIAGNOSIS — I10 Essential (primary) hypertension: Secondary | ICD-10-CM | POA: Diagnosis not present

## 2023-02-18 DIAGNOSIS — I872 Venous insufficiency (chronic) (peripheral): Secondary | ICD-10-CM | POA: Diagnosis not present

## 2023-02-18 DIAGNOSIS — R600 Localized edema: Secondary | ICD-10-CM | POA: Diagnosis not present

## 2023-02-18 DIAGNOSIS — E8770 Fluid overload, unspecified: Secondary | ICD-10-CM | POA: Diagnosis not present

## 2023-02-18 LAB — BASIC METABOLIC PANEL
Anion gap: 12 (ref 5–15)
BUN: 12 mg/dL (ref 8–23)
CO2: 26 mmol/L (ref 22–32)
Calcium: 9.2 mg/dL (ref 8.9–10.3)
Chloride: 98 mmol/L (ref 98–111)
Creatinine, Ser: 0.9 mg/dL (ref 0.44–1.00)
GFR, Estimated: >60 mL/min (ref >60)
Glucose, Bld: 102 mg/dL — ABNORMAL HIGH (ref 70–99)
Potassium: 3.4 mmol/L — ABNORMAL LOW (ref 3.5–5.1)
Sodium: 136 mmol/L (ref 135–145)

## 2023-02-18 LAB — ECHOCARDIOGRAM COMPLETE
AR max vel: 1.85 cm2
AV Peak grad: 7.8 mmHg
Ao pk vel: 1.4 m/s
Area-P 1/2: 3.37 cm2
S' Lateral: 2.5 cm
Weight: 2081.6 oz

## 2023-02-18 LAB — CULTURE, BLOOD (ROUTINE X 2)

## 2023-02-18 MED ORDER — SPIRONOLACTONE 25 MG PO TABS: 25.0000 mg | ORAL_TABLET | Freq: Every day | ORAL | 4 refills | Status: DC

## 2023-02-18 MED ORDER — METOPROLOL TARTRATE 12.5 MG HALF TABLET
12.5000 mg | ORAL_TABLET | Freq: Two times a day (BID) | ORAL | Status: DC
  Administered 2023-02-18: 12.5 mg via ORAL
  Filled 2023-02-18: qty 1

## 2023-02-18 MED ORDER — SPIRONOLACTONE 25 MG PO TABS
25.0000 mg | ORAL_TABLET | Freq: Every day | ORAL | Status: DC
  Administered 2023-02-18: 25 mg via ORAL
  Filled 2023-02-18: qty 1

## 2023-02-18 MED ORDER — DIPHENHYDRAMINE HCL 25 MG PO CAPS
25.0000 mg | ORAL_CAPSULE | Freq: Every evening | ORAL | Status: DC | PRN
  Administered 2023-02-18: 25 mg via ORAL
  Filled 2023-02-18: qty 1

## 2023-02-18 MED ORDER — POTASSIUM CHLORIDE CRYS ER 20 MEQ PO TBCR
40.0000 meq | EXTENDED_RELEASE_TABLET | Freq: Once | ORAL | Status: AC
  Administered 2023-02-18: 40 meq via ORAL
  Filled 2023-02-18: qty 2

## 2023-02-18 MED ORDER — AMOXICILLIN-POT CLAVULANATE 500-125 MG PO TABS: 1.0000 | ORAL_TABLET | Freq: Three times a day (TID) | ORAL | 0 refills | Status: DC

## 2023-02-18 NOTE — Progress Notes (Signed)
Echocardiogram 2D Echocardiogram has been performed.  Tiffany Fox 02/18/2023, 10:52 AM

## 2023-02-18 NOTE — Plan of Care (Signed)
  Problem: Nutrition: Goal: Adequate nutrition will be maintained Outcome: Completed/Met   Problem: Elimination: Goal: Will not experience complications related to bowel motility Outcome: Completed/Met Goal: Will not experience complications related to urinary retention Outcome: Completed/Met   Problem: Pain Managment: Goal: General experience of comfort will improve Outcome: Completed/Met   

## 2023-02-18 NOTE — Progress Notes (Signed)
Rounding Note    Patient Name: Tiffany Fox Date of Encounter: 02/18/2023  [facility] Cardiologist: Jodelle Red, MD   Subjective   Daughter at bedside. Feeling much better, LE edema continues to improve. Reviewed my read of her echo, which is reassuring. Discussed venous insufficiency, recommendations, management.  Inpatient Medications    Scheduled Meds:  enoxaparin (LOVENOX) injection  40 mg Subcutaneous Q24H   gabapentin  100 mg Oral QHS   metoprolol tartrate  12.5 mg Oral BID   spironolactone  25 mg Oral Daily   thyroid  60 mg Oral QAC breakfast   Continuous Infusions:  cefTRIAXone (ROCEPHIN)  IV 2 g (02/17/23 2111)   PRN Meds: acetaminophen **OR** acetaminophen, diphenhydrAMINE, magnesium hydroxide, melatonin, ondansetron **OR** ondansetron (ZOFRAN) IV   Vital Signs    Vitals:   02/18/23 0558 02/18/23 0600 02/18/23 0744 02/18/23 1154  BP: (!) 164/84  (!) 154/81 (!) 131/93  Pulse: (!) 105  85 94  Resp: 17  16 16   Temp: 98.1 F (36.7 C)  98.7 F (37.1 C) 98.4 F (36.9 C)  TempSrc: Oral  Axillary Oral  SpO2: 99%  97%   Weight:  59 kg      Intake/Output Summary (Last 24 hours) at 02/18/2023 1249 Last data filed at 02/18/2023 1004 Gross per 24 hour  Intake 700 ml  Output 2820 ml  Net -2120 ml      02/18/2023    6:00 AM 02/16/2023   11:03 PM 02/16/2023    2:21 PM  Last 3 Weights  Weight (lbs) 130 lb 1.6 oz 131 lb 4.8 oz 134 lb  Weight (kg) 59.013 kg 59.557 kg 60.782 kg      Telemetry    Sinus rhythm/sinus tach - Personally Reviewed  ECG    SR - Personally Reviewed  Physical Exam   GEN: No acute distress.   Neck: No JVD Cardiac: RRR, no murmurs, rubs, or gallops.  Respiratory: Clear to auscultation bilaterally. GI: Soft, nontender, non-distended  MS: improving bilateral LE edema, now trace-1+ with beginning of skin wrinkling Neuro:  Nonfocal  Psych: Normal affect   Labs    High Sensitivity Troponin:   Recent Labs   Lab 02/16/23 1452 02/16/23 1614  TROPONINIHS 28* 30*     Chemistry Recent Labs  Lab 02/16/23 1452 02/17/23 0147 02/17/23 0148 02/18/23 0137  NA 137  --  133* 136  K 4.0  --  3.1* 3.4*  CL 100  --  99 98  CO2 24  --  22 26  GLUCOSE 83  --  95 102*  BUN 14  --  14 12  CREATININE 0.84  --  0.94 0.90  CALCIUM 9.9  --  9.0 9.2  MG  --  1.8  --   --   PROT 8.0  --   --   --   ALBUMIN 4.6  --   --   --   AST 27  --   --   --   ALT 11  --   --   --   ALKPHOS 81  --   --   --   BILITOT 0.6  --   --   --   GFRNONAA >60  --  59* >60  ANIONGAP 13  --  12 12    Lipids No results for input(s): "CHOL", "TRIG", "HDL", "LABVLDL", "LDLCALC", "CHOLHDL" in the last 168 hours.  Hematology Recent Labs  Lab 02/16/23 1452 02/17/23 0148  WBC 3.5*  3.8*  RBC 3.94 3.48*  HGB 13.5 12.3  HCT 40.9 36.0  MCV 103.8* 103.4*  MCH 34.3* 35.3*  MCHC 33.0 34.2  RDW 14.0 13.9  PLT 244 225   Thyroid No results for input(s): "TSH", "FREET4" in the last 168 hours.  BNP Recent Labs  Lab 02/16/23 1452  BNP 429.2*    DDimer No results for input(s): "DDIMER" in the last 168 hours.   Radiology    VAS [place] LOWER EXTREMITY VENOUS (DVT)  Result Date: 02/17/2023  Lower Venous DVT Study Patient Name:  Tiffany Fox  Date of Exam:   02/17/2023 Medical Rec #: [id number]          Accession #:    [id number] Date of Birth: 14-Aug-1934          Patient Gender: F Patient Age:   89 years Exam Location:  [facility] Procedure:      VAS [place] LOWER EXTREMITY VENOUS (DVT) Referring Phys: Valente David --------------------------------------------------------------------------------  Indications: Pain.  Risk Factors: None identified. Limitations: Poor ultrasound/tissue interface. Comparison Study: No prior studies. Performing Technologist: Chanda Busing RVT  Examination Guidelines: A complete evaluation includes B-mode imaging, spectral Doppler, color Doppler, and power Doppler as needed of all accessible portions of  each vessel. Bilateral testing is considered an integral part of a complete examination. Limited examinations for reoccurring indications may be performed as noted. The reflux portion of the exam is performed with the patient in reverse Trendelenburg.  +---------+---------------+---------+-----------+----------+--------------+ RIGHT    CompressibilityPhasicitySpontaneityPropertiesThrombus Aging +---------+---------------+---------+-----------+----------+--------------+ CFV      Full           Yes      Yes                                 +---------+---------------+---------+-----------+----------+--------------+ SFJ      Full                                                        +---------+---------------+---------+-----------+----------+--------------+ FV Prox  Full                                                        +---------+---------------+---------+-----------+----------+--------------+ FV Mid   Full                                                        +---------+---------------+---------+-----------+----------+--------------+ FV DistalFull                                                        +---------+---------------+---------+-----------+----------+--------------+ PFV      Full                                                        +---------+---------------+---------+-----------+----------+--------------+  POP      Full           Yes      Yes                                 +---------+---------------+---------+-----------+----------+--------------+ PTV      Full                                                        +---------+---------------+---------+-----------+----------+--------------+ PERO     Full                                                        +---------+---------------+---------+-----------+----------+--------------+   +---------+---------------+---------+-----------+----------+--------------+ LEFT      CompressibilityPhasicitySpontaneityPropertiesThrombus Aging +---------+---------------+---------+-----------+----------+--------------+ CFV      Full           Yes      Yes                                 +---------+---------------+---------+-----------+----------+--------------+ SFJ      Full                                                        +---------+---------------+---------+-----------+----------+--------------+ FV Prox  Full                                                        +---------+---------------+---------+-----------+----------+--------------+ FV Mid   Full                                                        +---------+---------------+---------+-----------+----------+--------------+ FV DistalFull                                                        +---------+---------------+---------+-----------+----------+--------------+ PFV      Full                                                        +---------+---------------+---------+-----------+----------+--------------+ POP      Full           Yes      Yes                                 +---------+---------------+---------+-----------+----------+--------------+  PTV      Full                                                        +---------+---------------+---------+-----------+----------+--------------+ PERO     Full                                                        +---------+---------------+---------+-----------+----------+--------------+     Summary: RIGHT: - There is no evidence of deep vein thrombosis in the lower extremity.  - No cystic structure found in the popliteal fossa.  LEFT: - There is no evidence of deep vein thrombosis in the lower extremity.  - No cystic structure found in the popliteal fossa.  *See table(s) above for measurements and observations. Electronically signed by Coral Else MD on 02/17/2023 at 3:42:46 PM.    Final    DG Chest 2  View  Result Date: 02/16/2023 CLINICAL DATA:  Bilateral leg swelling.  Question fluid. EXAM: CHEST - 2 VIEW COMPARISON:  07/23/2021. FINDINGS: Unchanged hyperexpanded lungs with mild interstitial prominence, consistent with emphysema. Stable cardiac and mediastinal contours. No consolidation or pulmonary edema. No pleural effusion or pneumothorax. IMPRESSION: 1.  No evidence of acute cardiopulmonary disease. 2. Emphysema (ICD10-J43.9). Electronically Signed   By: Orvan Falconer M.D.   On: 02/16/2023 15:02    Cardiac Studies   Echo ordered, pending  Patient Profile     89 y.o. female with a hx of leg edema, leukocytopenia, hypertension and a history of syncope who is being seen for the evaluation of leg edema at the request of Dr. Jerolyn Center   Assessment & Plan    Bilateral LE edema: BNP elevated on presentation, but lungs clear, no JVD. Differential is venous insufficiency vs. Cardiac edema.  -Most likely venous insufficiency, as she notes a long history of vein issues. Venous dopplers negative for clot.  -Discussed compression stockings, elevation again today  I personally reviewed her echo. Preserved LVEF, indeterminate diastolic function, normal right atrial pressure of 3 mmHg, normal RVSP of 22 mmHg. This suggests noncardiac etiology of her edema.   Hypertension -hold amlodipine given LE edema -with low K, will start spironolactone today  [facility] will sign off.   Medication Recommendations:  Stop amlodipine, start spironolactone Other recommendations (labs, testing, etc):  bmet at follow up Follow up as an outpatient:  We will arrange for outpatient follow up with cardiology  For questions or updates, please contact [facility] Please consult [email or web address] for contact info under        Signed, Jodelle Red, MD  02/18/2023, 12:49 PM

## 2023-02-18 NOTE — Discharge Summary (Signed)
Physician Discharge Summary  Tiffany Fox ZOX:[id number] DOB: [birth year] DOA: 02/16/2023  PCP: [facility] [place]  Admit date: 02/16/2023 Discharge date: 02/18/2023  Admitted From:home Disposition: home Recommendations for Outpatient Follow-up:  Follow up with PCP in 1-2 weeks Please obtain BMP/CBC in one week Please follow up with cardiology Home Health:none Equipment/Devices none Discharge Condition:stable CODE STATUS full Diet recommendation: cardiac Brief/Interim Summary:  89 y.o. female with medical history significant for anxiety, depression, hypertension, hypothyroidism and osteoporosis, who presented to the emergency room with acute onset of bilateral lower extremity edema with mild pain and tenderness which has been intermittent over the last few months and worse over the last few days.  She had a spider bite on the right leg and her left leg is more erythematous and tender.  No chest pain or dyspnea or cough or wheezing.  She denies any orthopnea or paroxysmal nocturnal dyspnea or dyspnea on exertion.  No recent travels or surgeries.  No fever or chills.   ED Course: Upon presentation to the emergency room, BP was 161/94 with otherwise normal vital signs.  Labs revealed unremarkable CMP.  BNP was 429 point and high sensitive troponin I was 28 and later 30.  CBC showed mild leukopenia of 3.5 with macrocytosis..  Blood cultures were drawn. EKG as reviewed by me : EKG showed normal sinus rhythm with a rate of 87 with T wave inversion anterolaterally and inferiorly. Imaging: 2 view chest x-ray showed emphysema with no acute intracranial normalities.   The patient was given 40 mg of IV Lasix.  She was directly admitted to a progressive unit bed for further evaluation and management.   Discharge Diagnoses:  Principal Problem:   CHF (congestive heart failure) (HCC) Active Problems:   Bilateral lower extremity edema   Hypothyroidism   Essential  hypertension   Anxiety   Hypervolemia  Bilateral lower extremity edema-this was thought to be related to chronic venous stasis and possible cellulitis of the left lower extremity and right lower extremity.  Patient had spider bites to the right lower extremity with tenderness and erythema.  She was treated with Rocephin during the hospital stay and discharged on Augmentin to finish a course of 5 days.  Doppler of the lower extremities were negative for DVT.  Echocardiogram normal ejection fraction.  At the time of admission she had mildly elevated BNP and was given Lasix this was stopped on discharge. It was also thought that Norvasc could be contributing her lower extremity edema so Norvasc was also stopped at the time of discharge.  She was started on spironolactone instead for blood pressure control. Hypothyroidism - We will continue thyroid replacement.   Essential hypertension - We will continue her antihypertensives.  Estimated body mass index is 23.05 kg/m as calculated from the following:   Height as of 09/30/21: 5\' 3"  (1.6 m).   Weight as of this encounter: 59 kg.  Discharge Instructions  Discharge Instructions     Ambulatory referral to Cardiology   Complete by: As directed    If you have not heard from the Cardiology office within the next 72 hours please call [phone].   Diet - low sodium heart healthy   Complete by: As directed    Increase activity slowly   Complete by: As directed       Allergies as of 02/18/2023       Reactions   Lisinopril Rash        Medication List  STOP taking these medications    amLODipine 5 MG tablet Commonly known as: NORVASC   clonazePAM 0.5 MG tablet Commonly known as: KLONOPIN   oxyCODONE-acetaminophen 5-325 MG tablet Commonly known as: PERCOCET/ROXICET   tiZANidine 2 MG tablet Commonly known as: ZANAFLEX       TAKE these medications    amoxicillin-clavulanate 500-125 MG tablet Commonly known as:  Augmentin Take 1 tablet by mouth 3 (three) times daily.   aspirin EC 81 MG tablet Take 1 tablet (81 mg total) by mouth 2 (two) times daily.   CITRACAL +D3 PO Take 2 tablets by mouth daily.   HAIR/SKIN/NAILS/BIOTIN PO Take 3 each by mouth daily.   Salonpas 3.10-10-08 % Ptch Generic drug: Camphor-Menthol-Methyl Sal Apply 1 patch topically daily as needed (pain). With Lidocaine   spironolactone 25 MG tablet Commonly known as: ALDACTONE Take 1 tablet (25 mg total) by mouth daily. Start taking on: Feb 19, 2023   thyroid 60 MG tablet Commonly known as: ARMOUR Take 60 mg by mouth daily before breakfast.        Follow-up Information     [facility] [place]. Go on 03/08/2023.   Specialty: Family Medicine Why: @10 :45am Contact information: [address] [place] [address] [phone]         Jodelle Red, MD Follow up.   Specialty: Cardiology Why: HOSPITAL FOLLOW UP Contact information: [address] [place] [address] [phone]         Alver Sorrow, NP. Go on 03/09/2023.   Specialty: Cardiology Why: Appt at 10:55 AM on 03/09/23 with Gillian Shields, NP at the Heart & Vascular office at [facility]. Office is on the second floor. Contact information: [address] [place] [address] [phone]                Allergies  Allergen Reactions   Lisinopril Rash    Consultations: Cardiology   Procedures/Studies: VAS [place] LOWER EXTREMITY VENOUS (DVT)  Result Date: 02/17/2023  Lower Venous DVT Study Patient Name:  Tiffany Fox  Date of Exam:   02/17/2023 Medical Rec #: [id number]          Accession #:    [id number] Date of Birth: Apr 12, 1934          Patient Gender: F Patient Age:   89 years Exam Location:  [facility] Procedure:      VAS [place] LOWER EXTREMITY VENOUS (DVT) Referring Phys: Valente David --------------------------------------------------------------------------------   Indications: Pain.  Risk Factors: None identified. Limitations: Poor ultrasound/tissue interface. Comparison Study: No prior studies. Performing Technologist: Chanda Busing RVT  Examination Guidelines: A complete evaluation includes B-mode imaging, spectral Doppler, color Doppler, and power Doppler as needed of all accessible portions of each vessel. Bilateral testing is considered an integral part of a complete examination. Limited examinations for reoccurring indications may be performed as noted. The reflux portion of the exam is performed with the patient in reverse Trendelenburg.  +---------+---------------+---------+-----------+----------+--------------+ RIGHT    CompressibilityPhasicitySpontaneityPropertiesThrombus Aging +---------+---------------+---------+-----------+----------+--------------+ CFV      Full           Yes      Yes                                 +---------+---------------+---------+-----------+----------+--------------+ SFJ      Full                                                        +---------+---------------+---------+-----------+----------+--------------+  FV Prox  Full                                                        +---------+---------------+---------+-----------+----------+--------------+ FV Mid   Full                                                        +---------+---------------+---------+-----------+----------+--------------+ FV DistalFull                                                        +---------+---------------+---------+-----------+----------+--------------+ PFV      Full                                                        +---------+---------------+---------+-----------+----------+--------------+ POP      Full           Yes      Yes                                 +---------+---------------+---------+-----------+----------+--------------+ PTV      Full                                                         +---------+---------------+---------+-----------+----------+--------------+ PERO     Full                                                        +---------+---------------+---------+-----------+----------+--------------+   +---------+---------------+---------+-----------+----------+--------------+ LEFT     CompressibilityPhasicitySpontaneityPropertiesThrombus Aging +---------+---------------+---------+-----------+----------+--------------+ CFV      Full           Yes      Yes                                 +---------+---------------+---------+-----------+----------+--------------+ SFJ      Full                                                        +---------+---------------+---------+-----------+----------+--------------+ FV Prox  Full                                                        +---------+---------------+---------+-----------+----------+--------------+  FV Mid   Full                                                        +---------+---------------+---------+-----------+----------+--------------+ FV DistalFull                                                        +---------+---------------+---------+-----------+----------+--------------+ PFV      Full                                                        +---------+---------------+---------+-----------+----------+--------------+ POP      Full           Yes      Yes                                 +---------+---------------+---------+-----------+----------+--------------+ PTV      Full                                                        +---------+---------------+---------+-----------+----------+--------------+ PERO     Full                                                        +---------+---------------+---------+-----------+----------+--------------+     Summary: RIGHT: - There is no evidence of deep vein thrombosis in the lower extremity.  - No cystic  structure found in the popliteal fossa.  LEFT: - There is no evidence of deep vein thrombosis in the lower extremity.  - No cystic structure found in the popliteal fossa.  *See table(s) above for measurements and observations. Electronically signed by Coral Else MD on 02/17/2023 at 3:42:46 PM.    Final    DG Chest 2 View  Result Date: 02/16/2023 CLINICAL DATA:  Bilateral leg swelling.  Question fluid. EXAM: CHEST - 2 VIEW COMPARISON:  07/23/2021. FINDINGS: Unchanged hyperexpanded lungs with mild interstitial prominence, consistent with emphysema. Stable cardiac and mediastinal contours. No consolidation or pulmonary edema. No pleural effusion or pneumothorax. IMPRESSION: 1.  No evidence of acute cardiopulmonary disease. 2. Emphysema (ICD10-J43.9). Electronically Signed   By: Orvan Falconer M.D.   On: 02/16/2023 15:02   (Echo, Carotid, EGD, Colonoscopy, ERCP)    Subjective: Anxious to go home.Marland Kitchen  Discharge Exam: Vitals:   02/18/23 0744 02/18/23 1154  BP: (!) 154/81 (!) 131/93  Pulse: 85 94  Resp: 16 16  Temp: 98.7 F (37.1 C) 98.4 F (36.9 C)  SpO2: 97%    Vitals:   02/18/23 0558 02/18/23 0600 02/18/23 0744 02/18/23 1154  BP: (!) 164/84  (!) 154/81 (!) 131/93  Pulse: Marland Kitchen)  105  85 94  Resp: 17  16 16   Temp: 98.1 F (36.7 C)  98.7 F (37.1 C) 98.4 F (36.9 C)  TempSrc: Oral  Axillary Oral  SpO2: 99%  97%   Weight:  59 kg      General: Pt is alert, awake, not in acute distress Cardiovascular: RRR, S1/S2 +, no rubs, no gallops Respiratory: CTA bilaterally, no wheezing, no rhonchi Abdominal: Soft, NT, ND, bowel sounds + Extremities: no edema Erythema b/l    The results of significant diagnostics from this hospitalization (including imaging, microbiology, ancillary and laboratory) are listed below for reference.     Microbiology: Recent Results (from the past 240 hour(s))  Culture, blood (x 2)     Status: None (Preliminary result)   Collection Time: 02/17/23  1:46 AM    Specimen: BLOOD  Result Value Ref Range Status   Specimen Description BLOOD SITE NOT SPECIFIED  Final   Special Requests   Final    BOTTLES DRAWN AEROBIC AND ANAEROBIC Blood Culture adequate volume   Culture   Final    NO GROWTH 1 DAY Performed at [facility], [address] [place], [place] [address]    Report Status PENDING  Incomplete  Culture, blood (x 2)     Status: None (Preliminary result)   Collection Time: 02/17/23  1:46 AM   Specimen: BLOOD  Result Value Ref Range Status   Specimen Description BLOOD SITE NOT SPECIFIED  Final   Special Requests   Final    BOTTLES DRAWN AEROBIC AND ANAEROBIC Blood Culture adequate volume   Culture   Final    NO GROWTH 1 DAY Performed at [facility], [address] [place], [place] [address]    Report Status PENDING  Incomplete     Labs: BNP (last 3 results) Recent Labs    02/16/23 1452  BNP 429.2*   Basic Metabolic Panel: Recent Labs  Lab 02/16/23 1452 02/17/23 0147 02/17/23 0148 02/18/23 0137  NA 137  --  133* 136  K 4.0  --  3.1* 3.4*  CL 100  --  99 98  CO2 24  --  22 26  GLUCOSE 83  --  95 102*  BUN 14  --  14 12  CREATININE 0.84  --  0.94 0.90  CALCIUM 9.9  --  9.0 9.2  MG  --  1.8  --   --    Liver Function Tests: Recent Labs  Lab 02/16/23 1452  AST 27  ALT 11  ALKPHOS 81  BILITOT 0.6  PROT 8.0  ALBUMIN 4.6   No results for input(s): "LIPASE", "AMYLASE" in the last 168 hours. No results for input(s): "AMMONIA" in the last 168 hours. CBC: Recent Labs  Lab 02/16/23 1452 02/17/23 0148  WBC 3.5* 3.8*  HGB 13.5 12.3  HCT 40.9 36.0  MCV 103.8* 103.4*  PLT 244 225   Cardiac Enzymes: No results for input(s): "CKTOTAL", "CKMB", "CKMBINDEX", "TROPONINI" in the last 168 hours. BNP: Invalid input(s): "POCBNP" CBG: No results for input(s): "GLUCAP" in the last 168 hours. D-Dimer No results for input(s): "DDIMER" in the last 72 hours. Hgb A1c No results for input(s): "HGBA1C" in the last  72 hours. Lipid Profile No results for input(s): "CHOL", "HDL", "LDLCALC", "TRIG", "CHOLHDL", "LDLDIRECT" in the last 72 hours. Thyroid function studies No results for input(s): "TSH", "T4TOTAL", "T3FREE", "THYROIDAB" in the last 72 hours.  Invalid input(s): "FREET3" Anemia work up No results for input(s): "VITAMINB12", "  FOLATE", "FERRITIN", "TIBC", "IRON", "RETICCTPCT" in the last 72 hours. Urinalysis    Component Value Date/Time   COLORURINE YELLOW 09/23/2021 1428   APPEARANCEUR CLEAR 09/23/2021 1428   LABSPEC 1.017 09/23/2021 1428   PHURINE 6.0 09/23/2021 1428   GLUCOSEU NEGATIVE 09/23/2021 1428   HGBUR NEGATIVE 09/23/2021 1428   BILIRUBINUR NEGATIVE 09/23/2021 1428   KETONESUR 5 (A) 09/23/2021 1428   PROTEINUR NEGATIVE 09/23/2021 1428   UROBILINOGEN 0.2 09/01/2011 2150   NITRITE NEGATIVE 09/23/2021 1428   LEUKOCYTESUR NEGATIVE 09/23/2021 1428   Sepsis Labs Recent Labs  Lab 02/16/23 1452 02/17/23 0148  WBC 3.5* 3.8*   Microbiology Recent Results (from the past 240 hour(s))  Culture, blood (x 2)     Status: None (Preliminary result)   Collection Time: 02/17/23  1:46 AM   Specimen: BLOOD  Result Value Ref Range Status   Specimen Description BLOOD SITE NOT SPECIFIED  Final   Special Requests   Final    BOTTLES DRAWN AEROBIC AND ANAEROBIC Blood Culture adequate volume   Culture   Final    NO GROWTH 1 DAY Performed at [facility], [address], [place], [place] [address]    Report Status PENDING  Incomplete  Culture, blood (x 2)     Status: None (Preliminary result)   Collection Time: 02/17/23  1:46 AM   Specimen: BLOOD  Result Value Ref Range Status   Specimen Description BLOOD SITE NOT SPECIFIED  Final   Special Requests   Final    BOTTLES DRAWN AEROBIC AND ANAEROBIC Blood Culture adequate volume   Culture   Final    NO GROWTH 1 DAY Performed at [facility], [address], [place], [place] [address]    Report Status PENDING  Incomplete      Time coordinating discharge: 39 minutes  SIGNED: Alwyn Ren, MD  [facility] 02/18/2023, 1:59 PM

## 2023-02-18 NOTE — Progress Notes (Signed)
Heart Failure Navigator Progress Note  Assessed for Heart & Vascular TOC clinic readiness.  Patient does not meet criteria due to EF normal , edema was related to non - cardiac cause. .   Navigator will sign off at this time.   Rhae Hammock, BSN, Scientist, clinical (histocompatibility and immunogenetics) Only

## 2023-02-18 NOTE — Plan of Care (Signed)
  Problem: Education: Goal: Knowledge of General Education information will improve Description: Including pain rating scale, medication(s)/side effects and non-pharmacologic comfort measures Outcome: Progressing   Problem: Health Behavior/Discharge Planning: Goal: Ability to manage health-related needs will improve Outcome: Progressing   Problem: Clinical Measurements: Goal: Ability to maintain clinical measurements within normal limits will improve Outcome: Progressing Goal: Will remain free from infection Outcome: Progressing Goal: Diagnostic test results will improve Outcome: Progressing Goal: Respiratory complications will improve Outcome: Progressing Goal: Cardiovascular complication will be avoided Outcome: Progressing   Problem: Activity: Goal: Risk for activity intolerance will decrease Outcome: Progressing   Problem: Coping: Goal: Level of anxiety will decrease Outcome: Progressing   Problem: Safety: Goal: Ability to remain free from injury will improve Outcome: Progressing   Problem: Skin Integrity: Goal: Risk for impaired skin integrity will decrease Outcome: Progressing   Problem: Fluid Volume: Goal: Hemodynamic stability will improve Outcome: Progressing   Problem: Clinical Measurements: Goal: Diagnostic test results will improve Outcome: Progressing Goal: Signs and symptoms of infection will decrease Outcome: Progressing   Problem: Respiratory: Goal: Ability to maintain adequate ventilation will improve Outcome: Progressing

## 2023-02-18 NOTE — Progress Notes (Signed)
Nursing dc note  Piv dcd,site unremarkable upon dc. Patient and daughter verbalized understanding of dc instructions. All belongings and dc papers given to patient. Ccmd notified of dc order. Vlounteer services taken patient out to main entrance via wheel chair

## 2023-02-19 LAB — CULTURE, BLOOD (ROUTINE X 2): Culture: NO GROWTH

## 2023-02-20 LAB — CULTURE, BLOOD (ROUTINE X 2): Culture: NO GROWTH

## 2023-02-21 LAB — CULTURE, BLOOD (ROUTINE X 2)

## 2023-02-22 LAB — CULTURE, BLOOD (ROUTINE X 2)
Special Requests: ADEQUATE
Special Requests: ADEQUATE

## 2023-03-08 DIAGNOSIS — I872 Venous insufficiency (chronic) (peripheral): Secondary | ICD-10-CM | POA: Diagnosis not present

## 2023-03-08 DIAGNOSIS — I10 Essential (primary) hypertension: Secondary | ICD-10-CM | POA: Diagnosis not present

## 2023-03-08 DIAGNOSIS — Z8679 Personal history of other diseases of the circulatory system: Secondary | ICD-10-CM | POA: Diagnosis not present

## 2023-03-08 DIAGNOSIS — M810 Age-related osteoporosis without current pathological fracture: Secondary | ICD-10-CM | POA: Diagnosis not present

## 2023-03-08 DIAGNOSIS — Z79899 Other long term (current) drug therapy: Secondary | ICD-10-CM | POA: Diagnosis not present

## 2023-03-08 DIAGNOSIS — D72819 Decreased white blood cell count, unspecified: Secondary | ICD-10-CM | POA: Diagnosis not present

## 2023-03-08 DIAGNOSIS — E039 Hypothyroidism, unspecified: Secondary | ICD-10-CM | POA: Diagnosis not present

## 2023-03-09 ENCOUNTER — Ambulatory Visit (HOSPITAL_BASED_OUTPATIENT_CLINIC_OR_DEPARTMENT_OTHER): Payer: Medicare Other | Admitting: Family

## 2023-03-11 ENCOUNTER — Encounter (HOSPITAL_BASED_OUTPATIENT_CLINIC_OR_DEPARTMENT_OTHER): Payer: Self-pay | Admitting: Family

## 2023-03-11 ENCOUNTER — Ambulatory Visit (HOSPITAL_BASED_OUTPATIENT_CLINIC_OR_DEPARTMENT_OTHER): Payer: Medicare Other | Admitting: Family

## 2023-03-11 ENCOUNTER — Ambulatory Visit (INDEPENDENT_AMBULATORY_CARE_PROVIDER_SITE_OTHER): Payer: Medicare Other | Admitting: Family

## 2023-03-11 VITALS — BP 130/82 | HR 94 | Ht 63.0 in | Wt 129.0 lb

## 2023-03-11 DIAGNOSIS — I872 Venous insufficiency (chronic) (peripheral): Secondary | ICD-10-CM | POA: Diagnosis not present

## 2023-03-11 DIAGNOSIS — I10 Essential (primary) hypertension: Secondary | ICD-10-CM | POA: Diagnosis not present

## 2023-03-11 MED ORDER — SPIRONOLACTONE 25 MG PO TABS
25.0000 mg | ORAL_TABLET | Freq: Every day | ORAL | 1 refills | Status: AC
Start: 2023-03-11 — End: ?

## 2023-03-11 NOTE — Progress Notes (Signed)
Office Visit    Patient Name: Tiffany Fox Date of Encounter: 03/11/2023  PCP:  Gweneth Dimitri, MD   [facility]  Cardiologist:  Jodelle Red, MD  Advanced Practice Provider:  No care team member to display Electrophysiologist:  None      Chief Complaint    Tiffany Fox is a 89 y.o. female presents today for hospital follow up.    Past Medical History    Past Medical History:  Diagnosis Date   Anxiety    Arthritis    Cervical spondylosis    Depression    Hypertension    Hypothyroidism    Leukocytopenia    Osteoporosis    Past Surgical History:  Procedure Laterality Date   APPENDECTOMY  2012   EYE SURGERY Bilateral 2012   FRACTURE SURGERY     HIP FRACTURE SURGERY Left 05.2011   JOINT REPLACEMENT     TOTAL HIP REVISION Left 09/30/2021   Procedure: LEFT TOTAL HIP REVISION POSTERIOR AND NAIL REMOVAL;  Surgeon: Gean Birchwood, MD;  Location: [facility];  Service: Orthopedics;  Laterality: Left;   TOTAL KNEE ARTHROPLASTY Right 10/18/2018   TOTAL KNEE ARTHROPLASTY Right 10/18/2018   Procedure: TOTAL KNEE ARTHROPLASTY;  Surgeon: Marcene Corning, MD;  Location: [facility];  Service: Orthopedics;  Laterality: Right;    Allergies  Allergies  Allergen Reactions   Lisinopril Rash    History of Present Illness    Tiffany Fox is a 89 y.o. female with a hx of hypertension, anxiety, depression, hypothyroidism, venous insufficiency, arthritis s/p knee replacement last seen while hospitalized.  Initially established 11/2018 for tachycardia and orthostatic hypotension after knee surgery. It was felt to be exacerbated by diarrhea and fish oil was discontinued. She preferred lifestyle management for hyperlipidemia.   Admitted 02/2023 with edema of bilateral legs with recent spider bite treated with abx. Dopplers negative for DVT, echo normal LVEF. Norvasc was stopped and started on Spironolactone.   Presents today for follow up with her  daughter. She notes lower extremity swelling by the end of the day which is better in the morning. She has had some bilateral redness to lower extremities - we reviewed venous stasis color change vs cellulitis. No evidence of cellulitis on exam today. Notes more memory difficulties over the last few months and considering move to [facility] area to be closer to her daughter.   Reports no shortness of breath nor dyspnea on exertion. Reports no chest pain, pressure, or tightness. No  orthopnea, PND. Reports no palpitations.    Prior antihypertensive Amlodipine - swelling on 10mg  Lisinopril - rash (body pruritus)  EKGs/Labs/Other Studies Reviewed:   The following studies were reviewed today: Cardiac Studies & Procedures       ECHOCARDIOGRAM  ECHOCARDIOGRAM COMPLETE 02/18/2023  Narrative ECHOCARDIOGRAM REPORT    Patient Name:   Tiffany Fox Date of Exam: 02/18/2023 Medical Rec #:  [id number]         Height:       63.0 in Accession #:    [id number]        Weight:       130.1 lb Date of Birth:  1934/09/12         BSA:          1.611 m Patient Age:    88 years          BP:           131/93 mmHg Patient Gender: F  HR:           75 bpm. Exam Location:  Inpatient  Procedure: 2D Echo, Cardiac Doppler and Color Doppler  Indications:    Dyspnea  History:        Patient has no prior history of Echocardiogram examinations. CHF; Risk Factors:Hypertension.  Sonographer:    Lucendia Herrlich Referring Phys: HAO MENG  IMPRESSIONS   1. Left ventricular ejection fraction, by estimation, is 55 to 60%. The left ventricle has normal function. The left ventricle has no regional wall motion abnormalities. There is mild concentric left ventricular hypertrophy. Left ventricular diastolic parameters are consistent with Grade I diastolic dysfunction (impaired relaxation). Elevated left ventricular end-diastolic pressure. 2. Right ventricular systolic function is normal. The right  ventricular size is normal. There is normal pulmonary artery systolic pressure. The estimated right ventricular systolic pressure is 22.4 mmHg. 3. The mitral valve is degenerative. Mild mitral valve regurgitation. No evidence of mitral stenosis. 4. Focal heavy calcification of the right coronary cusp. The aortic valve is calcified. Aortic valve regurgitation is trivial. No aortic stenosis is present. Aortic valve Vmax measures 1.40 m/s. 5. The inferior vena cava is normal in size with greater than 50% respiratory variability, suggesting right atrial pressure of 3 mmHg.  FINDINGS Left Ventricle: Left ventricular ejection fraction, by estimation, is 55 to 60%. The left ventricle has normal function. The left ventricle has no regional wall motion abnormalities. The left ventricular internal cavity size was normal in size. There is mild concentric left ventricular hypertrophy. Left ventricular diastolic parameters are consistent with Grade I diastolic dysfunction (impaired relaxation). Elevated left ventricular end-diastolic pressure.  Right Ventricle: The right ventricular size is normal. No increase in right ventricular wall thickness. Right ventricular systolic function is normal. There is normal pulmonary artery systolic pressure. The tricuspid regurgitant velocity is 2.20 m/s, and with an assumed right atrial pressure of 3 mmHg, the estimated right ventricular systolic pressure is 22.4 mmHg.  Left Atrium: Left atrial size was normal in size.  Right Atrium: Right atrial size was normal in size.  Pericardium: There is no evidence of pericardial effusion.  Mitral Valve: The mitral valve is degenerative in appearance. Mild to moderate mitral annular calcification. Mild mitral valve regurgitation. No evidence of mitral valve stenosis.  Tricuspid Valve: The tricuspid valve is normal in structure. Tricuspid valve regurgitation is mild . No evidence of tricuspid stenosis.  Aortic Valve: Focal heavy  calcification of the right coronary cusp. The aortic valve is calcified. Aortic valve regurgitation is trivial. No aortic stenosis is present. Aortic valve peak gradient measures 7.8 mmHg.  Pulmonic Valve: The pulmonic valve was normal in structure. Pulmonic valve regurgitation is trivial. No evidence of pulmonic stenosis.  Aorta: The aortic root is normal in size and structure.  Venous: The inferior vena cava is normal in size with greater than 50% respiratory variability, suggesting right atrial pressure of 3 mmHg.  IAS/Shunts: No atrial level shunt detected by color flow Doppler.   LEFT VENTRICLE PLAX 2D LVIDd:         3.70 cm   Diastology LVIDs:         2.50 cm   LV e' medial:    3.92 cm/s LV PW:         1.30 cm   LV E/e' medial:  27.0 LV IVS:        1.20 cm   LV e' lateral:   8.70 cm/s LVOT diam:     1.90 cm   LV  E/e' lateral: 12.2 LV SV:         50 LV SV Index:   31 LVOT Area:     2.84 cm   RIGHT VENTRICLE             IVC RV S prime:     10.60 cm/s  IVC diam: 1.30 cm TAPSE (M-mode): 1.5 cm  LEFT ATRIUM           Index        RIGHT ATRIUM           Index LA diam:      4.30 cm 2.67 cm/m   RA Area:     13.20 cm LA Vol (A2C): 44.2 ml 27.41 ml/m  RA Volume:   29.20 ml  18.13 ml/m LA Vol (A4C): 33.7 ml 20.92 ml/m AORTIC VALVE AV Area (Vmax): 1.85 cm AV Vmax:        140.00 cm/s AV Peak Grad:   7.8 mmHg LVOT Vmax:      91.27 cm/s LVOT Vmean:     59.033 cm/s LVOT VTI:       0.177 m  AORTA Ao Asc diam: 3.50 cm  MITRAL VALVE                TRICUSPID VALVE MV Area (PHT): 3.37 cm     TR Peak grad:   19.4 mmHg MV Decel Time: 225 msec     TR Vmax:        220.00 cm/s MV E velocity: 106.00 cm/s MV A velocity: 120.00 cm/s  SHUNTS MV E/A ratio:  0.88         Systemic VTI:  0.18 m Systemic Diam: 1.90 cm  Armanda Magic MD Electronically signed by Armanda Magic MD Signature Date/Time: 02/18/2023/2:17:43 PM    Final              EKG:  EKG is not ordered today.     Recent Labs: 02/16/2023: ALT 11; B Natriuretic Peptide 429.2 02/17/2023: Hemoglobin 12.3; Magnesium 1.8; Platelets 225 02/18/2023: BUN 12; Creatinine, Ser 0.90; Potassium 3.4; Sodium 136  Recent Lipid Panel No results found for: "CHOL", "TRIG", "HDL", "CHOLHDL", "VLDL", "LDLCALC", "LDLDIRECT"   Home Medications   Current Meds  Medication Sig   Calcium-Phosphorus-Vitamin D (CITRACAL +D3 PO) Take 2 tablets by mouth daily.   Camphor-Menthol-Methyl Sal (SALONPAS) 3.10-10-08 % PTCH Apply 1 patch topically daily as needed (pain). With Lidocaine   Multiple Vitamins-Minerals (HAIR/SKIN/NAILS/BIOTIN PO) Take 3 each by mouth daily.   thyroid (ARMOUR) 60 MG tablet Take 60 mg by mouth daily before breakfast.    [DISCONTINUED] spironolactone (ALDACTONE) 25 MG tablet Take 1 tablet (25 mg total) by mouth daily.     Review of Systems      All other systems reviewed and are otherwise negative except as noted above.  Physical Exam    VS:  BP 130/82   Pulse 94   Ht 5\' 3"  (1.6 m)   Wt 129 lb (58.5 kg)   BMI 22.85 kg/m  , BMI Body mass index is 22.85 kg/m.  Wt Readings from Last 3 Encounters:  03/11/23 129 lb (58.5 kg)  02/18/23 130 lb 1.6 oz (59 kg)  09/30/21 119 lb 0.8 oz (54 kg)     GEN: Well nourished, well developed, in no acute distress. HEENT: normal. Neck: Supple, no JVD, carotid bruits, or masses. Cardiac: RRR, no murmurs, rubs, or gallops. No clubbing, cyanosis. Trace pedal edema. Venous stasis color changes to bilateral LE. Marland Kitchen  Radials/PT 2+ and equal bilaterally.  Respiratory:  Respirations regular and unlabored, clear to auscultation bilaterally. GI: Soft, nontender, nondistended. MS: No deformity or atrophy. Skin: Warm and dry, no rash. Neuro:  Strength and sensation are intact. Psych: Normal affect.  Assessment & Plan    HTN - Initial BP mildly elevated 142/86 but improved to 130/82 without intervention. Discussed to monitor BP at home at least 2 hours after medications  and sitting for 5-10 minutes. Swelling improved since transition from Amlodipine to Spironolactone. BMP earlier this week with PCP normal renal function and potassium.   Venous insufficiency / LE edema - Trace edema on exam. No evidence of recurrent cellulitis. Recommend compression stockings, elevation. Continue Spironolactone 25mg  QD. Refill provided.         Disposition: Follow up in 6 month(s) with Jodelle Red, MD or APP.  Signed, Alver Sorrow, NP 03/11/2023, 8:27 PM [facility]

## 2023-03-11 NOTE — Patient Instructions (Signed)
Medication Instructions:  Your physician recommends that you continue on your current medications as directed. Please refer to the Current Medication list given to you today.  *If you need a refill on your cardiac medications before your next appointment, please call your pharmacy*  Follow-Up: At [facility], you and your health needs are our priority.  As part of our continuing mission to provide you with exceptional heart care, we have created designated Provider Care Teams.  These Care Teams include your primary Cardiologist (physician) and Advanced Practice Providers (APPs -  Physician Assistants and Nurse Practitioners) who all work together to provide you with the care you need, when you need it.  We recommend signing up for the patient portal called "MyChart".  Sign up information is provided on this After Visit Summary.  MyChart is used to connect with patients for Virtual Visits (Telemedicine).  Patients are able to view lab/test results, encounter notes, upcoming appointments, etc.  Non-urgent messages can be sent to your provider as well.   To learn more about what you can do with MyChart, go to [email or web address].    Your next appointment:   6 month(s)  Provider:   Jodelle Red, MD    Other Instructions Chronic Venous Insufficiency Chronic venous insufficiency is a condition that causes the veins in the legs to struggle to pump blood from the legs to the heart. It is also called venous stasis. This condition can happen when the vein walls are stretched, weakened, or damaged. It can also happen when the valves inside the vein are damaged. With the right treatment, you should be able to still lead an active life. What are the causes? Common causes of this condition include: Venous hypertension. This is high blood pressure inside the veins. Sitting or standing too long. This can cause increased blood pressure in the veins of the leg. Deep vein thrombosis  (DVT). This is a blood clot that blocks blood flow in a vein. Phlebitis. This is inflammation of a vein. It can cause a blood clot to form. An abnormal growth of cells (tumor) in the area between your hip bones (pelvis). This can cause blood to back up. What increases the risk? Factors that may make you more likely to get this condition include: Having a family history of the condition. Being overweight. Being pregnant. Not getting enough exercise. Smoking. Having a job that requires you to sit or stand in one place for a long time. Being a certain age. Females in their 52s and 40s and males in their 58s are more likely to get this condition. What are the signs or symptoms? Symptoms of this condition include: Varicose veins. These are veins that are enlarged, bulging, or twisted. Skin breakdown or ulcers. Reddened skin or dark discoloration of the skin on the leg between the knee and ankle. Lipodermatosclerosis. This is brown, smooth, tight, and painful skin just above the ankle. It is often on the inside of the leg. Swelling of the legs. How is this diagnosed? This condition may be diagnosed based on your medical history and a physical exam. You may also need tests, such as: A duplex ultrasound. This shows how blood flows through a blood vessel. Plethysmography. This tests blood flow. Venogram. This looks at the veins using an X-ray and dye. How is this treated? The goals of treatment are to help you return to an active life and to relieve pain. Treatment may include: Wearing compression stockings. These do not cure the condition but can  help relieve symptoms. They can also help stop your condition from getting worse. Sclerotherapy. This involves injecting a solution to shrink damaged veins. Surgery. This may include: Vein stripping. This is when a diseased vein is taken out. Laser ablation surgery. This is when blood flow is cut off through the vein. Repairing or remaking a valve  inside the affected vein. Follow these instructions at home: Lifestyle Do not use any products that contain nicotine or tobacco. These products include cigarettes, chewing tobacco, and vaping devices, such as e-cigarettes. If you need help quitting, ask your health care provider. Stay active. Exercise, walk, or do other activities. Ask your provider what activities are safe for you. General instructions Take over-the-counter and prescription medicines only as told by your provider. Drink enough fluid to keep your pee (urine) pale yellow. Wear compression stockings as told by your provider. These stockings help to prevent blood clots and reduce swelling in your legs. Keep all follow-up visits. Your provider will check your legs for any changes and adjust your treatment plan as needed. Contact a health care provider if: You have redness, swelling, or more pain in the affected area. You see a red streak or line that goes up or down from the area. You have skin breakdown or skin loss. You get an injury in the affected area. You get a fever. Get help right away if: You have severe pain that does not get better with medicine. You get an injury and an open wound in the affected area. Your foot or ankle becomes numb or weak all of a sudden. You have trouble moving your foot or ankle. Your symptoms do not go away or get worse. You have chest pain. You have shortness of breath. These symptoms may be an emergency. Get help right away. Call 911. Do not wait to see if the symptoms will go away. Do not drive yourself to the hospital. This information is not intended to replace advice given to you by your health care provider. Make sure you discuss any questions you have with your health care provider. Document Revised: 10/06/2022 Document Reviewed: 10/06/2022 Elsevier Patient Education  2024 [employer].

## 2023-04-21 DIAGNOSIS — E039 Hypothyroidism, unspecified: Secondary | ICD-10-CM | POA: Diagnosis not present

## 2023-04-21 DIAGNOSIS — Z6823 Body mass index (BMI) 23.0-23.9, adult: Secondary | ICD-10-CM | POA: Diagnosis not present

## 2023-04-21 DIAGNOSIS — I872 Venous insufficiency (chronic) (peripheral): Secondary | ICD-10-CM | POA: Diagnosis not present

## 2023-04-21 DIAGNOSIS — I10 Essential (primary) hypertension: Secondary | ICD-10-CM | POA: Diagnosis not present

## 2023-04-21 DIAGNOSIS — Z8679 Personal history of other diseases of the circulatory system: Secondary | ICD-10-CM | POA: Diagnosis not present

## 2023-04-21 DIAGNOSIS — M810 Age-related osteoporosis without current pathological fracture: Secondary | ICD-10-CM | POA: Diagnosis not present

## 2023-04-21 DIAGNOSIS — R053 Chronic cough: Secondary | ICD-10-CM | POA: Diagnosis not present

## 2023-04-21 DIAGNOSIS — R21 Rash and other nonspecific skin eruption: Secondary | ICD-10-CM | POA: Diagnosis not present

## 2023-04-21 DIAGNOSIS — R413 Other amnesia: Secondary | ICD-10-CM | POA: Diagnosis not present

## 2023-05-06 ENCOUNTER — Other Ambulatory Visit: Payer: Self-pay | Admitting: Family Medicine

## 2023-05-06 DIAGNOSIS — R413 Other amnesia: Secondary | ICD-10-CM

## 2023-06-06 ENCOUNTER — Other Ambulatory Visit: Payer: Medicare Other

## 2023-06-21 ENCOUNTER — Ambulatory Visit
Admission: RE | Admit: 2023-06-21 | Discharge: 2023-06-21 | Disposition: A | Discharge status: Home / Self Care | Payer: Medicare Other | Source: Ambulatory Visit | Attending: Family Medicine | Admitting: Family Medicine

## 2023-06-21 DIAGNOSIS — I6782 Cerebral ischemia: Secondary | ICD-10-CM | POA: Diagnosis not present

## 2023-06-21 DIAGNOSIS — G319 Degenerative disease of nervous system, unspecified: Secondary | ICD-10-CM | POA: Diagnosis not present

## 2023-06-21 DIAGNOSIS — R413 Other amnesia: Secondary | ICD-10-CM

## 2023-06-21 MED ORDER — GADOPICLENOL 0.5 MMOL/ML IV SOLN
5.0000 mL | Freq: Once | INTRAVENOUS | Status: AC | PRN
  Administered 2023-06-21: 5 mL via INTRAVENOUS

## 2023-06-28 DIAGNOSIS — I69811 Memory deficit following other cerebrovascular disease: Secondary | ICD-10-CM | POA: Diagnosis not present

## 2023-06-28 DIAGNOSIS — I10 Essential (primary) hypertension: Secondary | ICD-10-CM | POA: Diagnosis not present

## 2023-06-28 DIAGNOSIS — I5030 Unspecified diastolic (congestive) heart failure: Secondary | ICD-10-CM | POA: Diagnosis not present

## 2023-06-28 DIAGNOSIS — F325 Major depressive disorder, single episode, in full remission: Secondary | ICD-10-CM | POA: Diagnosis not present

## 2023-06-28 DIAGNOSIS — F01B0 Vascular dementia, moderate, without behavioral disturbance, psychotic disturbance, mood disturbance, and anxiety: Secondary | ICD-10-CM | POA: Diagnosis not present

## 2023-06-28 DIAGNOSIS — K900 Celiac disease: Secondary | ICD-10-CM | POA: Diagnosis not present

## 2023-06-28 DIAGNOSIS — M810 Age-related osteoporosis without current pathological fracture: Secondary | ICD-10-CM | POA: Diagnosis not present

## 2023-06-28 DIAGNOSIS — I872 Venous insufficiency (chronic) (peripheral): Secondary | ICD-10-CM | POA: Diagnosis not present

## 2023-06-29 DIAGNOSIS — I10 Essential (primary) hypertension: Secondary | ICD-10-CM | POA: Diagnosis not present

## 2023-06-29 DIAGNOSIS — Z79899 Other long term (current) drug therapy: Secondary | ICD-10-CM | POA: Diagnosis not present

## 2023-06-29 DIAGNOSIS — F01B0 Vascular dementia, moderate, without behavioral disturbance, psychotic disturbance, mood disturbance, and anxiety: Secondary | ICD-10-CM | POA: Diagnosis not present

## 2023-06-29 DIAGNOSIS — F325 Major depressive disorder, single episode, in full remission: Secondary | ICD-10-CM | POA: Diagnosis not present

## 2023-06-29 DIAGNOSIS — E559 Vitamin D deficiency, unspecified: Secondary | ICD-10-CM | POA: Diagnosis not present

## 2023-06-29 DIAGNOSIS — E039 Hypothyroidism, unspecified: Secondary | ICD-10-CM | POA: Diagnosis not present

## 2023-07-09 DIAGNOSIS — M25512 Pain in left shoulder: Secondary | ICD-10-CM | POA: Diagnosis not present

## 2023-08-01 DIAGNOSIS — I110 Hypertensive heart disease with heart failure: Secondary | ICD-10-CM | POA: Diagnosis not present

## 2023-08-01 DIAGNOSIS — Z9181 History of falling: Secondary | ICD-10-CM | POA: Diagnosis not present

## 2023-08-01 DIAGNOSIS — M24612 Ankylosis, left shoulder: Secondary | ICD-10-CM | POA: Diagnosis not present

## 2023-08-01 DIAGNOSIS — F0393 Unspecified dementia, unspecified severity, with mood disturbance: Secondary | ICD-10-CM | POA: Diagnosis not present

## 2023-08-01 DIAGNOSIS — E039 Hypothyroidism, unspecified: Secondary | ICD-10-CM | POA: Diagnosis not present

## 2023-08-01 DIAGNOSIS — F32A Depression, unspecified: Secondary | ICD-10-CM | POA: Diagnosis not present

## 2023-08-01 DIAGNOSIS — I87001 Postthrombotic syndrome without complications of right lower extremity: Secondary | ICD-10-CM | POA: Diagnosis not present

## 2023-08-01 DIAGNOSIS — I509 Heart failure, unspecified: Secondary | ICD-10-CM | POA: Diagnosis not present

## 2023-08-02 DIAGNOSIS — I110 Hypertensive heart disease with heart failure: Secondary | ICD-10-CM | POA: Diagnosis not present

## 2023-08-02 DIAGNOSIS — F0393 Unspecified dementia, unspecified severity, with mood disturbance: Secondary | ICD-10-CM | POA: Diagnosis not present

## 2023-08-02 DIAGNOSIS — E039 Hypothyroidism, unspecified: Secondary | ICD-10-CM | POA: Diagnosis not present

## 2023-08-02 DIAGNOSIS — M24612 Ankylosis, left shoulder: Secondary | ICD-10-CM | POA: Diagnosis not present

## 2023-08-02 DIAGNOSIS — I509 Heart failure, unspecified: Secondary | ICD-10-CM | POA: Diagnosis not present

## 2023-08-02 DIAGNOSIS — F32A Depression, unspecified: Secondary | ICD-10-CM | POA: Diagnosis not present

## 2023-08-06 DIAGNOSIS — I509 Heart failure, unspecified: Secondary | ICD-10-CM | POA: Diagnosis not present

## 2023-08-06 DIAGNOSIS — I110 Hypertensive heart disease with heart failure: Secondary | ICD-10-CM | POA: Diagnosis not present

## 2023-08-06 DIAGNOSIS — E039 Hypothyroidism, unspecified: Secondary | ICD-10-CM | POA: Diagnosis not present

## 2023-08-06 DIAGNOSIS — F32A Depression, unspecified: Secondary | ICD-10-CM | POA: Diagnosis not present

## 2023-08-06 DIAGNOSIS — M24612 Ankylosis, left shoulder: Secondary | ICD-10-CM | POA: Diagnosis not present

## 2023-08-06 DIAGNOSIS — F0393 Unspecified dementia, unspecified severity, with mood disturbance: Secondary | ICD-10-CM | POA: Diagnosis not present

## 2023-08-09 DIAGNOSIS — F0393 Unspecified dementia, unspecified severity, with mood disturbance: Secondary | ICD-10-CM | POA: Diagnosis not present

## 2023-08-09 DIAGNOSIS — I509 Heart failure, unspecified: Secondary | ICD-10-CM | POA: Diagnosis not present

## 2023-08-09 DIAGNOSIS — I110 Hypertensive heart disease with heart failure: Secondary | ICD-10-CM | POA: Diagnosis not present

## 2023-08-09 DIAGNOSIS — E039 Hypothyroidism, unspecified: Secondary | ICD-10-CM | POA: Diagnosis not present

## 2023-08-09 DIAGNOSIS — M24612 Ankylosis, left shoulder: Secondary | ICD-10-CM | POA: Diagnosis not present

## 2023-08-09 DIAGNOSIS — F32A Depression, unspecified: Secondary | ICD-10-CM | POA: Diagnosis not present

## 2023-08-11 DIAGNOSIS — F0393 Unspecified dementia, unspecified severity, with mood disturbance: Secondary | ICD-10-CM | POA: Diagnosis not present

## 2023-08-11 DIAGNOSIS — E039 Hypothyroidism, unspecified: Secondary | ICD-10-CM | POA: Diagnosis not present

## 2023-08-11 DIAGNOSIS — M24612 Ankylosis, left shoulder: Secondary | ICD-10-CM | POA: Diagnosis not present

## 2023-08-11 DIAGNOSIS — I509 Heart failure, unspecified: Secondary | ICD-10-CM | POA: Diagnosis not present

## 2023-08-11 DIAGNOSIS — F32A Depression, unspecified: Secondary | ICD-10-CM | POA: Diagnosis not present

## 2023-08-11 DIAGNOSIS — I110 Hypertensive heart disease with heart failure: Secondary | ICD-10-CM | POA: Diagnosis not present

## 2023-08-11 DIAGNOSIS — R059 Cough, unspecified: Secondary | ICD-10-CM | POA: Diagnosis not present

## 2023-08-12 DIAGNOSIS — M24612 Ankylosis, left shoulder: Secondary | ICD-10-CM | POA: Diagnosis not present

## 2023-08-13 DIAGNOSIS — E039 Hypothyroidism, unspecified: Secondary | ICD-10-CM | POA: Diagnosis not present

## 2023-08-13 DIAGNOSIS — I509 Heart failure, unspecified: Secondary | ICD-10-CM | POA: Diagnosis not present

## 2023-08-13 DIAGNOSIS — F32A Depression, unspecified: Secondary | ICD-10-CM | POA: Diagnosis not present

## 2023-08-13 DIAGNOSIS — I110 Hypertensive heart disease with heart failure: Secondary | ICD-10-CM | POA: Diagnosis not present

## 2023-08-13 DIAGNOSIS — F0393 Unspecified dementia, unspecified severity, with mood disturbance: Secondary | ICD-10-CM | POA: Diagnosis not present

## 2023-08-13 DIAGNOSIS — M24612 Ankylosis, left shoulder: Secondary | ICD-10-CM | POA: Diagnosis not present

## 2023-08-16 DIAGNOSIS — F0393 Unspecified dementia, unspecified severity, with mood disturbance: Secondary | ICD-10-CM | POA: Diagnosis not present

## 2023-08-16 DIAGNOSIS — M24612 Ankylosis, left shoulder: Secondary | ICD-10-CM | POA: Diagnosis not present

## 2023-08-16 DIAGNOSIS — I509 Heart failure, unspecified: Secondary | ICD-10-CM | POA: Diagnosis not present

## 2023-08-16 DIAGNOSIS — I110 Hypertensive heart disease with heart failure: Secondary | ICD-10-CM | POA: Diagnosis not present

## 2023-08-16 DIAGNOSIS — F32A Depression, unspecified: Secondary | ICD-10-CM | POA: Diagnosis not present

## 2023-08-16 DIAGNOSIS — E039 Hypothyroidism, unspecified: Secondary | ICD-10-CM | POA: Diagnosis not present

## 2023-08-18 DIAGNOSIS — M24612 Ankylosis, left shoulder: Secondary | ICD-10-CM | POA: Diagnosis not present

## 2023-08-18 DIAGNOSIS — I509 Heart failure, unspecified: Secondary | ICD-10-CM | POA: Diagnosis not present

## 2023-08-18 DIAGNOSIS — F32A Depression, unspecified: Secondary | ICD-10-CM | POA: Diagnosis not present

## 2023-08-18 DIAGNOSIS — I110 Hypertensive heart disease with heart failure: Secondary | ICD-10-CM | POA: Diagnosis not present

## 2023-08-18 DIAGNOSIS — F0393 Unspecified dementia, unspecified severity, with mood disturbance: Secondary | ICD-10-CM | POA: Diagnosis not present

## 2023-08-18 DIAGNOSIS — E039 Hypothyroidism, unspecified: Secondary | ICD-10-CM | POA: Diagnosis not present

## 2023-08-26 DIAGNOSIS — E039 Hypothyroidism, unspecified: Secondary | ICD-10-CM | POA: Diagnosis not present

## 2023-08-26 DIAGNOSIS — M24612 Ankylosis, left shoulder: Secondary | ICD-10-CM | POA: Diagnosis not present

## 2023-08-26 DIAGNOSIS — I110 Hypertensive heart disease with heart failure: Secondary | ICD-10-CM | POA: Diagnosis not present

## 2023-08-26 DIAGNOSIS — I509 Heart failure, unspecified: Secondary | ICD-10-CM | POA: Diagnosis not present

## 2023-08-26 DIAGNOSIS — F0393 Unspecified dementia, unspecified severity, with mood disturbance: Secondary | ICD-10-CM | POA: Diagnosis not present

## 2023-08-26 DIAGNOSIS — F32A Depression, unspecified: Secondary | ICD-10-CM | POA: Diagnosis not present

## 2023-08-27 DIAGNOSIS — F0393 Unspecified dementia, unspecified severity, with mood disturbance: Secondary | ICD-10-CM | POA: Diagnosis not present

## 2023-08-27 DIAGNOSIS — E039 Hypothyroidism, unspecified: Secondary | ICD-10-CM | POA: Diagnosis not present

## 2023-08-27 DIAGNOSIS — I110 Hypertensive heart disease with heart failure: Secondary | ICD-10-CM | POA: Diagnosis not present

## 2023-08-27 DIAGNOSIS — F32A Depression, unspecified: Secondary | ICD-10-CM | POA: Diagnosis not present

## 2023-08-27 DIAGNOSIS — I509 Heart failure, unspecified: Secondary | ICD-10-CM | POA: Diagnosis not present

## 2023-08-27 DIAGNOSIS — M24612 Ankylosis, left shoulder: Secondary | ICD-10-CM | POA: Diagnosis not present

## 2023-08-31 DIAGNOSIS — M24612 Ankylosis, left shoulder: Secondary | ICD-10-CM | POA: Diagnosis not present

## 2023-08-31 DIAGNOSIS — Z9181 History of falling: Secondary | ICD-10-CM | POA: Diagnosis not present

## 2023-08-31 DIAGNOSIS — I509 Heart failure, unspecified: Secondary | ICD-10-CM | POA: Diagnosis not present

## 2023-08-31 DIAGNOSIS — F0393 Unspecified dementia, unspecified severity, with mood disturbance: Secondary | ICD-10-CM | POA: Diagnosis not present

## 2023-08-31 DIAGNOSIS — I87001 Postthrombotic syndrome without complications of right lower extremity: Secondary | ICD-10-CM | POA: Diagnosis not present

## 2023-08-31 DIAGNOSIS — F32A Depression, unspecified: Secondary | ICD-10-CM | POA: Diagnosis not present

## 2023-08-31 DIAGNOSIS — E039 Hypothyroidism, unspecified: Secondary | ICD-10-CM | POA: Diagnosis not present

## 2023-08-31 DIAGNOSIS — I110 Hypertensive heart disease with heart failure: Secondary | ICD-10-CM | POA: Diagnosis not present

## 2023-09-01 DIAGNOSIS — M24612 Ankylosis, left shoulder: Secondary | ICD-10-CM | POA: Diagnosis not present

## 2023-09-01 DIAGNOSIS — I509 Heart failure, unspecified: Secondary | ICD-10-CM | POA: Diagnosis not present

## 2023-09-01 DIAGNOSIS — F32A Depression, unspecified: Secondary | ICD-10-CM | POA: Diagnosis not present

## 2023-09-01 DIAGNOSIS — F0393 Unspecified dementia, unspecified severity, with mood disturbance: Secondary | ICD-10-CM | POA: Diagnosis not present

## 2023-09-01 DIAGNOSIS — I110 Hypertensive heart disease with heart failure: Secondary | ICD-10-CM | POA: Diagnosis not present

## 2023-09-01 DIAGNOSIS — E039 Hypothyroidism, unspecified: Secondary | ICD-10-CM | POA: Diagnosis not present

## 2023-09-06 DIAGNOSIS — I509 Heart failure, unspecified: Secondary | ICD-10-CM | POA: Diagnosis not present

## 2023-09-06 DIAGNOSIS — M24612 Ankylosis, left shoulder: Secondary | ICD-10-CM | POA: Diagnosis not present

## 2023-09-06 DIAGNOSIS — I110 Hypertensive heart disease with heart failure: Secondary | ICD-10-CM | POA: Diagnosis not present

## 2023-09-06 DIAGNOSIS — F0393 Unspecified dementia, unspecified severity, with mood disturbance: Secondary | ICD-10-CM | POA: Diagnosis not present

## 2023-09-06 DIAGNOSIS — E039 Hypothyroidism, unspecified: Secondary | ICD-10-CM | POA: Diagnosis not present

## 2023-09-06 DIAGNOSIS — F32A Depression, unspecified: Secondary | ICD-10-CM | POA: Diagnosis not present

## 2023-09-08 DIAGNOSIS — E039 Hypothyroidism, unspecified: Secondary | ICD-10-CM | POA: Diagnosis not present

## 2023-09-08 DIAGNOSIS — I509 Heart failure, unspecified: Secondary | ICD-10-CM | POA: Diagnosis not present

## 2023-09-08 DIAGNOSIS — I110 Hypertensive heart disease with heart failure: Secondary | ICD-10-CM | POA: Diagnosis not present

## 2023-09-08 DIAGNOSIS — M24612 Ankylosis, left shoulder: Secondary | ICD-10-CM | POA: Diagnosis not present

## 2023-09-08 DIAGNOSIS — F32A Depression, unspecified: Secondary | ICD-10-CM | POA: Diagnosis not present

## 2023-09-08 DIAGNOSIS — F0393 Unspecified dementia, unspecified severity, with mood disturbance: Secondary | ICD-10-CM | POA: Diagnosis not present

## 2023-09-09 DIAGNOSIS — G9009 Other idiopathic peripheral autonomic neuropathy: Secondary | ICD-10-CM | POA: Diagnosis not present

## 2023-09-09 DIAGNOSIS — M24612 Ankylosis, left shoulder: Secondary | ICD-10-CM | POA: Diagnosis not present

## 2023-09-09 DIAGNOSIS — I10 Essential (primary) hypertension: Secondary | ICD-10-CM | POA: Diagnosis not present

## 2023-09-09 DIAGNOSIS — I5030 Unspecified diastolic (congestive) heart failure: Secondary | ICD-10-CM | POA: Diagnosis not present

## 2023-09-20 ENCOUNTER — Ambulatory Visit (HOSPITAL_BASED_OUTPATIENT_CLINIC_OR_DEPARTMENT_OTHER): Payer: Medicare Other | Admitting: Cardiology

## 2023-09-20 NOTE — Progress Notes (Incomplete)
  Cardiology Office Note:  .   Date:  09/20/2023  ID:  Tiffany Fox, DOB 12-11-1933, MRN [id number] PCP: Gweneth Dimitri, MD  [facility] Providers Cardiologist:  Jodelle Red, MD {  History of Present Illness: .   Tiffany Fox is a 89 y.o. female with a hx of insomnia, arthritis s/p knee replacement, hypertension, LE edema.   Cardiac history: Initial consult 11/30/18 for tachycardia and orthostatic hypotension after knee surgery. Has struggled with hypertension. Had severe swelling on 10 mg amlodipine but tolerates 5 mg well. Had a rash on lisinopril; was terrible, told not to be on it again. No anaphylaxis, just whole body extreme pruritis. Was on thiazide while pregnant for swelling. Already fatigued, wants to avoid a beta blocker. Admitted 02/2023 with edema, EF 55-60%, indeterminate diastolic function, right atrial pressure 3, felt to be non-cardiac edema.  Today: I most recently saw her in the hospital 02/2023, noted to have edema, but echo unremarkable, RAP 3. Changed from amlodipine to spironolactone. Felt to be most likely venous insufficiency.    ROS: Denies chest pain, shortness of breath at rest or with normal exertion. No PND, orthopnea, LE edema or unexpected weight gain. No syncope or palpitations. ROS otherwise negative except as noted.   Studies Reviewed: Marland Kitchen    EKG:       Physical Exam:   VS:  There were no vitals taken for this visit.   Wt Readings from Last 3 Encounters:  03/11/23 129 lb (58.5 kg)  02/18/23 130 lb 1.6 oz (59 kg)  09/30/21 119 lb 0.8 oz (54 kg)    GEN: Well nourished, well developed in no acute distress HEENT: Normal, moist mucous membranes NECK: No JVD CARDIAC: regular rhythm, normal S1 and S2, no rubs or gallops. No murmur. VASCULAR: Radial and DP pulses 2+ bilaterally. No carotid bruits RESPIRATORY:  Clear to auscultation without rales, wheezing or rhonchi  ABDOMEN: Soft, non-tender, non-distended MUSCULOSKELETAL:   Ambulates independently SKIN: Warm and dry, no edema NEUROLOGIC:  Alert and oriented x 3. No focal neuro deficits noted. PSYCHIATRIC:  Normal affect    ASSESSMENT AND PLAN: .    LE edema: -echo while in hospital 02/2023 supports non-cardiac in origin -stopped amlodipine, started spironolactone at that time -likely venous insufficiency -discussed compression stockings, elevation  Hypertension:  ***   CV risk counseling and prevention: -recommend heart healthy/Mediterranean diet, with whole grains, fruits, vegetable, fish, lean meats, nuts, and olive oil. Limit salt. -recommend moderate walking, 3-5 times/week for 30-50 minutes each session. Aim for at least 150 minutes.week. Goal should be pace of 3 miles/hours, or walking 1.5 miles in 30 minutes -recommend avoidance of tobacco products. Avoid excess alcohol.   Syncope: no recurrence -two prior episodes, both outside in the parking lot in warm weather. She had a prodrome of lightheadedness/dizzyness. Similar to prior distant events. Likely reflex syncope. -not related to sensation of palpitations -no further episodes  Dispo: ***  Signed, Jodelle Red, MD   Jodelle Red, MD, PhD, [facility] [place]  [facility]  [place]  [facility] [address], [address] [place], [place] [address] [phone]

## 2023-10-13 DIAGNOSIS — E559 Vitamin D deficiency, unspecified: Secondary | ICD-10-CM | POA: Diagnosis not present

## 2023-10-22 DIAGNOSIS — R278 Other lack of coordination: Secondary | ICD-10-CM | POA: Diagnosis not present

## 2023-10-22 DIAGNOSIS — M542 Cervicalgia: Secondary | ICD-10-CM | POA: Diagnosis not present

## 2023-10-22 DIAGNOSIS — G5601 Carpal tunnel syndrome, right upper limb: Secondary | ICD-10-CM | POA: Diagnosis not present

## 2023-10-22 DIAGNOSIS — M6281 Muscle weakness (generalized): Secondary | ICD-10-CM | POA: Diagnosis not present

## 2023-10-27 DIAGNOSIS — R278 Other lack of coordination: Secondary | ICD-10-CM | POA: Diagnosis not present

## 2023-10-27 DIAGNOSIS — G5601 Carpal tunnel syndrome, right upper limb: Secondary | ICD-10-CM | POA: Diagnosis not present

## 2023-10-27 DIAGNOSIS — M542 Cervicalgia: Secondary | ICD-10-CM | POA: Diagnosis not present

## 2023-10-27 DIAGNOSIS — M6281 Muscle weakness (generalized): Secondary | ICD-10-CM | POA: Diagnosis not present

## 2023-10-29 DIAGNOSIS — M6281 Muscle weakness (generalized): Secondary | ICD-10-CM | POA: Diagnosis not present

## 2023-10-29 DIAGNOSIS — R278 Other lack of coordination: Secondary | ICD-10-CM | POA: Diagnosis not present

## 2023-10-29 DIAGNOSIS — M542 Cervicalgia: Secondary | ICD-10-CM | POA: Diagnosis not present

## 2023-10-29 DIAGNOSIS — G5601 Carpal tunnel syndrome, right upper limb: Secondary | ICD-10-CM | POA: Diagnosis not present

## 2023-11-05 DIAGNOSIS — M542 Cervicalgia: Secondary | ICD-10-CM | POA: Diagnosis not present

## 2023-11-05 DIAGNOSIS — G5601 Carpal tunnel syndrome, right upper limb: Secondary | ICD-10-CM | POA: Diagnosis not present

## 2023-11-05 DIAGNOSIS — M6281 Muscle weakness (generalized): Secondary | ICD-10-CM | POA: Diagnosis not present

## 2023-11-05 DIAGNOSIS — R278 Other lack of coordination: Secondary | ICD-10-CM | POA: Diagnosis not present

## 2023-11-06 DIAGNOSIS — G5601 Carpal tunnel syndrome, right upper limb: Secondary | ICD-10-CM | POA: Diagnosis not present

## 2023-11-06 DIAGNOSIS — R278 Other lack of coordination: Secondary | ICD-10-CM | POA: Diagnosis not present

## 2023-11-06 DIAGNOSIS — M6281 Muscle weakness (generalized): Secondary | ICD-10-CM | POA: Diagnosis not present

## 2023-11-06 DIAGNOSIS — M542 Cervicalgia: Secondary | ICD-10-CM | POA: Diagnosis not present

## 2023-11-08 DIAGNOSIS — M542 Cervicalgia: Secondary | ICD-10-CM | POA: Diagnosis not present

## 2023-11-08 DIAGNOSIS — R278 Other lack of coordination: Secondary | ICD-10-CM | POA: Diagnosis not present

## 2023-11-08 DIAGNOSIS — G5601 Carpal tunnel syndrome, right upper limb: Secondary | ICD-10-CM | POA: Diagnosis not present

## 2023-11-08 DIAGNOSIS — M6281 Muscle weakness (generalized): Secondary | ICD-10-CM | POA: Diagnosis not present

## 2023-11-09 DIAGNOSIS — G5601 Carpal tunnel syndrome, right upper limb: Secondary | ICD-10-CM | POA: Diagnosis not present

## 2023-11-09 DIAGNOSIS — R278 Other lack of coordination: Secondary | ICD-10-CM | POA: Diagnosis not present

## 2023-11-09 DIAGNOSIS — M542 Cervicalgia: Secondary | ICD-10-CM | POA: Diagnosis not present

## 2023-11-09 DIAGNOSIS — M6281 Muscle weakness (generalized): Secondary | ICD-10-CM | POA: Diagnosis not present

## 2023-11-11 DIAGNOSIS — M542 Cervicalgia: Secondary | ICD-10-CM | POA: Diagnosis not present

## 2023-11-11 DIAGNOSIS — M6281 Muscle weakness (generalized): Secondary | ICD-10-CM | POA: Diagnosis not present

## 2023-11-11 DIAGNOSIS — R278 Other lack of coordination: Secondary | ICD-10-CM | POA: Diagnosis not present

## 2023-11-11 DIAGNOSIS — G5601 Carpal tunnel syndrome, right upper limb: Secondary | ICD-10-CM | POA: Diagnosis not present

## 2023-11-12 DIAGNOSIS — M542 Cervicalgia: Secondary | ICD-10-CM | POA: Diagnosis not present

## 2023-11-12 DIAGNOSIS — M6281 Muscle weakness (generalized): Secondary | ICD-10-CM | POA: Diagnosis not present

## 2023-11-12 DIAGNOSIS — R278 Other lack of coordination: Secondary | ICD-10-CM | POA: Diagnosis not present

## 2023-11-12 DIAGNOSIS — G5601 Carpal tunnel syndrome, right upper limb: Secondary | ICD-10-CM | POA: Diagnosis not present

## 2023-11-16 DIAGNOSIS — G5601 Carpal tunnel syndrome, right upper limb: Secondary | ICD-10-CM | POA: Diagnosis not present

## 2023-11-16 DIAGNOSIS — M6281 Muscle weakness (generalized): Secondary | ICD-10-CM | POA: Diagnosis not present

## 2023-11-16 DIAGNOSIS — R278 Other lack of coordination: Secondary | ICD-10-CM | POA: Diagnosis not present

## 2023-11-16 DIAGNOSIS — M542 Cervicalgia: Secondary | ICD-10-CM | POA: Diagnosis not present

## 2023-11-17 DIAGNOSIS — R278 Other lack of coordination: Secondary | ICD-10-CM | POA: Diagnosis not present

## 2023-11-17 DIAGNOSIS — M6281 Muscle weakness (generalized): Secondary | ICD-10-CM | POA: Diagnosis not present

## 2023-11-17 DIAGNOSIS — M542 Cervicalgia: Secondary | ICD-10-CM | POA: Diagnosis not present

## 2023-11-17 DIAGNOSIS — G5601 Carpal tunnel syndrome, right upper limb: Secondary | ICD-10-CM | POA: Diagnosis not present

## 2023-11-23 DIAGNOSIS — M6281 Muscle weakness (generalized): Secondary | ICD-10-CM | POA: Diagnosis not present

## 2023-11-23 DIAGNOSIS — M542 Cervicalgia: Secondary | ICD-10-CM | POA: Diagnosis not present

## 2023-11-23 DIAGNOSIS — G5601 Carpal tunnel syndrome, right upper limb: Secondary | ICD-10-CM | POA: Diagnosis not present

## 2023-11-23 DIAGNOSIS — R278 Other lack of coordination: Secondary | ICD-10-CM | POA: Diagnosis not present

## 2023-11-26 DIAGNOSIS — Z79899 Other long term (current) drug therapy: Secondary | ICD-10-CM | POA: Diagnosis not present

## 2023-11-26 DIAGNOSIS — E559 Vitamin D deficiency, unspecified: Secondary | ICD-10-CM | POA: Diagnosis not present

## 2023-11-29 DIAGNOSIS — G5601 Carpal tunnel syndrome, right upper limb: Secondary | ICD-10-CM | POA: Diagnosis not present

## 2023-11-29 DIAGNOSIS — M6281 Muscle weakness (generalized): Secondary | ICD-10-CM | POA: Diagnosis not present

## 2023-11-29 DIAGNOSIS — R278 Other lack of coordination: Secondary | ICD-10-CM | POA: Diagnosis not present

## 2023-11-29 DIAGNOSIS — M542 Cervicalgia: Secondary | ICD-10-CM | POA: Diagnosis not present

## 2023-12-08 DIAGNOSIS — M542 Cervicalgia: Secondary | ICD-10-CM | POA: Diagnosis not present

## 2023-12-08 DIAGNOSIS — R278 Other lack of coordination: Secondary | ICD-10-CM | POA: Diagnosis not present

## 2023-12-08 DIAGNOSIS — G5601 Carpal tunnel syndrome, right upper limb: Secondary | ICD-10-CM | POA: Diagnosis not present

## 2023-12-08 DIAGNOSIS — F325 Major depressive disorder, single episode, in full remission: Secondary | ICD-10-CM | POA: Diagnosis not present

## 2023-12-08 DIAGNOSIS — M6281 Muscle weakness (generalized): Secondary | ICD-10-CM | POA: Diagnosis not present

## 2023-12-14 DIAGNOSIS — G5601 Carpal tunnel syndrome, right upper limb: Secondary | ICD-10-CM | POA: Diagnosis not present

## 2023-12-14 DIAGNOSIS — M6281 Muscle weakness (generalized): Secondary | ICD-10-CM | POA: Diagnosis not present

## 2023-12-14 DIAGNOSIS — M542 Cervicalgia: Secondary | ICD-10-CM | POA: Diagnosis not present

## 2023-12-14 DIAGNOSIS — R278 Other lack of coordination: Secondary | ICD-10-CM | POA: Diagnosis not present

## 2023-12-16 DIAGNOSIS — M6281 Muscle weakness (generalized): Secondary | ICD-10-CM | POA: Diagnosis not present

## 2023-12-16 DIAGNOSIS — M542 Cervicalgia: Secondary | ICD-10-CM | POA: Diagnosis not present

## 2023-12-16 DIAGNOSIS — G5601 Carpal tunnel syndrome, right upper limb: Secondary | ICD-10-CM | POA: Diagnosis not present

## 2023-12-16 DIAGNOSIS — R278 Other lack of coordination: Secondary | ICD-10-CM | POA: Diagnosis not present

## 2023-12-20 DIAGNOSIS — R278 Other lack of coordination: Secondary | ICD-10-CM | POA: Diagnosis not present

## 2023-12-20 DIAGNOSIS — M542 Cervicalgia: Secondary | ICD-10-CM | POA: Diagnosis not present

## 2023-12-20 DIAGNOSIS — M6281 Muscle weakness (generalized): Secondary | ICD-10-CM | POA: Diagnosis not present

## 2023-12-20 DIAGNOSIS — G5601 Carpal tunnel syndrome, right upper limb: Secondary | ICD-10-CM | POA: Diagnosis not present

## 2023-12-21 DIAGNOSIS — M6281 Muscle weakness (generalized): Secondary | ICD-10-CM | POA: Diagnosis not present

## 2023-12-21 DIAGNOSIS — R278 Other lack of coordination: Secondary | ICD-10-CM | POA: Diagnosis not present

## 2023-12-21 DIAGNOSIS — G5601 Carpal tunnel syndrome, right upper limb: Secondary | ICD-10-CM | POA: Diagnosis not present

## 2023-12-21 DIAGNOSIS — M542 Cervicalgia: Secondary | ICD-10-CM | POA: Diagnosis not present

## 2023-12-23 DIAGNOSIS — M6281 Muscle weakness (generalized): Secondary | ICD-10-CM | POA: Diagnosis not present

## 2023-12-23 DIAGNOSIS — G5601 Carpal tunnel syndrome, right upper limb: Secondary | ICD-10-CM | POA: Diagnosis not present

## 2023-12-23 DIAGNOSIS — M542 Cervicalgia: Secondary | ICD-10-CM | POA: Diagnosis not present

## 2023-12-23 DIAGNOSIS — R278 Other lack of coordination: Secondary | ICD-10-CM | POA: Diagnosis not present

## 2023-12-24 DIAGNOSIS — M542 Cervicalgia: Secondary | ICD-10-CM | POA: Diagnosis not present

## 2023-12-24 DIAGNOSIS — M6281 Muscle weakness (generalized): Secondary | ICD-10-CM | POA: Diagnosis not present

## 2023-12-24 DIAGNOSIS — R278 Other lack of coordination: Secondary | ICD-10-CM | POA: Diagnosis not present

## 2023-12-24 DIAGNOSIS — G5601 Carpal tunnel syndrome, right upper limb: Secondary | ICD-10-CM | POA: Diagnosis not present

## 2023-12-28 DIAGNOSIS — M542 Cervicalgia: Secondary | ICD-10-CM | POA: Diagnosis not present

## 2023-12-28 DIAGNOSIS — R278 Other lack of coordination: Secondary | ICD-10-CM | POA: Diagnosis not present

## 2023-12-28 DIAGNOSIS — M6281 Muscle weakness (generalized): Secondary | ICD-10-CM | POA: Diagnosis not present

## 2023-12-28 DIAGNOSIS — G5601 Carpal tunnel syndrome, right upper limb: Secondary | ICD-10-CM | POA: Diagnosis not present

## 2023-12-30 DIAGNOSIS — M542 Cervicalgia: Secondary | ICD-10-CM | POA: Diagnosis not present

## 2023-12-30 DIAGNOSIS — M6281 Muscle weakness (generalized): Secondary | ICD-10-CM | POA: Diagnosis not present

## 2023-12-30 DIAGNOSIS — G5601 Carpal tunnel syndrome, right upper limb: Secondary | ICD-10-CM | POA: Diagnosis not present

## 2023-12-30 DIAGNOSIS — R278 Other lack of coordination: Secondary | ICD-10-CM | POA: Diagnosis not present

## 2024-01-12 DIAGNOSIS — R278 Other lack of coordination: Secondary | ICD-10-CM | POA: Diagnosis not present

## 2024-01-12 DIAGNOSIS — F325 Major depressive disorder, single episode, in full remission: Secondary | ICD-10-CM | POA: Diagnosis not present

## 2024-01-12 DIAGNOSIS — M6281 Muscle weakness (generalized): Secondary | ICD-10-CM | POA: Diagnosis not present

## 2024-01-12 DIAGNOSIS — G5601 Carpal tunnel syndrome, right upper limb: Secondary | ICD-10-CM | POA: Diagnosis not present

## 2024-01-12 DIAGNOSIS — M542 Cervicalgia: Secondary | ICD-10-CM | POA: Diagnosis not present

## 2024-01-18 DIAGNOSIS — M542 Cervicalgia: Secondary | ICD-10-CM | POA: Diagnosis not present

## 2024-01-18 DIAGNOSIS — M6281 Muscle weakness (generalized): Secondary | ICD-10-CM | POA: Diagnosis not present

## 2024-01-18 DIAGNOSIS — G5601 Carpal tunnel syndrome, right upper limb: Secondary | ICD-10-CM | POA: Diagnosis not present

## 2024-01-18 DIAGNOSIS — R278 Other lack of coordination: Secondary | ICD-10-CM | POA: Diagnosis not present

## 2024-01-25 DIAGNOSIS — R278 Other lack of coordination: Secondary | ICD-10-CM | POA: Diagnosis not present

## 2024-01-25 DIAGNOSIS — G5601 Carpal tunnel syndrome, right upper limb: Secondary | ICD-10-CM | POA: Diagnosis not present

## 2024-01-25 DIAGNOSIS — M6281 Muscle weakness (generalized): Secondary | ICD-10-CM | POA: Diagnosis not present

## 2024-01-25 DIAGNOSIS — M542 Cervicalgia: Secondary | ICD-10-CM | POA: Diagnosis not present

## 2024-01-26 DIAGNOSIS — F325 Major depressive disorder, single episode, in full remission: Secondary | ICD-10-CM | POA: Diagnosis not present

## 2024-01-28 DIAGNOSIS — M542 Cervicalgia: Secondary | ICD-10-CM | POA: Diagnosis not present

## 2024-01-28 DIAGNOSIS — R278 Other lack of coordination: Secondary | ICD-10-CM | POA: Diagnosis not present

## 2024-01-28 DIAGNOSIS — M25519 Pain in unspecified shoulder: Secondary | ICD-10-CM | POA: Diagnosis not present

## 2024-01-28 DIAGNOSIS — I509 Heart failure, unspecified: Secondary | ICD-10-CM | POA: Diagnosis not present

## 2024-01-28 DIAGNOSIS — M6281 Muscle weakness (generalized): Secondary | ICD-10-CM | POA: Diagnosis not present

## 2024-01-28 DIAGNOSIS — F0150 Vascular dementia without behavioral disturbance: Secondary | ICD-10-CM | POA: Diagnosis not present

## 2024-01-28 DIAGNOSIS — I110 Hypertensive heart disease with heart failure: Secondary | ICD-10-CM | POA: Diagnosis not present

## 2024-01-28 DIAGNOSIS — R079 Chest pain, unspecified: Secondary | ICD-10-CM | POA: Diagnosis not present

## 2024-01-28 DIAGNOSIS — I10 Essential (primary) hypertension: Secondary | ICD-10-CM | POA: Diagnosis not present

## 2024-01-28 DIAGNOSIS — R0789 Other chest pain: Secondary | ICD-10-CM | POA: Diagnosis not present

## 2024-01-28 DIAGNOSIS — G5601 Carpal tunnel syndrome, right upper limb: Secondary | ICD-10-CM | POA: Diagnosis not present

## 2024-01-28 DIAGNOSIS — E039 Hypothyroidism, unspecified: Secondary | ICD-10-CM | POA: Diagnosis not present

## 2024-01-28 DIAGNOSIS — E871 Hypo-osmolality and hyponatremia: Secondary | ICD-10-CM | POA: Diagnosis not present

## 2024-01-31 DIAGNOSIS — R0789 Other chest pain: Secondary | ICD-10-CM | POA: Diagnosis not present

## 2024-01-31 DIAGNOSIS — I5030 Unspecified diastolic (congestive) heart failure: Secondary | ICD-10-CM | POA: Diagnosis not present

## 2024-01-31 DIAGNOSIS — E871 Hypo-osmolality and hyponatremia: Secondary | ICD-10-CM | POA: Diagnosis not present

## 2024-01-31 DIAGNOSIS — I110 Hypertensive heart disease with heart failure: Secondary | ICD-10-CM | POA: Diagnosis not present

## 2024-01-31 DIAGNOSIS — I69811 Memory deficit following other cerebrovascular disease: Secondary | ICD-10-CM | POA: Diagnosis not present

## 2024-01-31 DIAGNOSIS — F01B0 Vascular dementia, moderate, without behavioral disturbance, psychotic disturbance, mood disturbance, and anxiety: Secondary | ICD-10-CM | POA: Diagnosis not present

## 2024-02-02 DIAGNOSIS — I110 Hypertensive heart disease with heart failure: Secondary | ICD-10-CM | POA: Diagnosis not present

## 2024-02-02 DIAGNOSIS — E039 Hypothyroidism, unspecified: Secondary | ICD-10-CM | POA: Diagnosis not present

## 2024-02-22 DIAGNOSIS — R0789 Other chest pain: Secondary | ICD-10-CM | POA: Diagnosis not present

## 2024-02-22 DIAGNOSIS — M25512 Pain in left shoulder: Secondary | ICD-10-CM | POA: Diagnosis not present

## 2024-02-22 DIAGNOSIS — R000 Tachycardia, unspecified: Secondary | ICD-10-CM | POA: Diagnosis not present

## 2024-02-22 DIAGNOSIS — G9009 Other idiopathic peripheral autonomic neuropathy: Secondary | ICD-10-CM | POA: Diagnosis not present

## 2024-02-22 DIAGNOSIS — K900 Celiac disease: Secondary | ICD-10-CM | POA: Diagnosis not present

## 2024-02-22 DIAGNOSIS — I110 Hypertensive heart disease with heart failure: Secondary | ICD-10-CM | POA: Diagnosis not present

## 2024-02-22 DIAGNOSIS — I872 Venous insufficiency (chronic) (peripheral): Secondary | ICD-10-CM | POA: Diagnosis not present

## 2024-02-22 DIAGNOSIS — M25511 Pain in right shoulder: Secondary | ICD-10-CM | POA: Diagnosis not present

## 2024-02-22 DIAGNOSIS — M810 Age-related osteoporosis without current pathological fracture: Secondary | ICD-10-CM | POA: Diagnosis not present

## 2024-02-22 DIAGNOSIS — I5030 Unspecified diastolic (congestive) heart failure: Secondary | ICD-10-CM | POA: Diagnosis not present

## 2024-02-22 DIAGNOSIS — M542 Cervicalgia: Secondary | ICD-10-CM | POA: Diagnosis not present

## 2024-02-22 DIAGNOSIS — I69811 Memory deficit following other cerebrovascular disease: Secondary | ICD-10-CM | POA: Diagnosis not present

## 2024-03-13 DIAGNOSIS — M810 Age-related osteoporosis without current pathological fracture: Secondary | ICD-10-CM | POA: Diagnosis not present

## 2024-03-13 DIAGNOSIS — I872 Venous insufficiency (chronic) (peripheral): Secondary | ICD-10-CM | POA: Diagnosis not present

## 2024-03-13 DIAGNOSIS — F01B0 Vascular dementia, moderate, without behavioral disturbance, psychotic disturbance, mood disturbance, and anxiety: Secondary | ICD-10-CM | POA: Diagnosis not present

## 2024-03-15 DIAGNOSIS — E871 Hypo-osmolality and hyponatremia: Secondary | ICD-10-CM | POA: Diagnosis not present
